# Patient Record
Sex: Male | Born: 1955 | Race: White | Hispanic: No | Marital: Married | Smoking: Former smoker
Health system: Southern US, Community
[De-identification: ages and names within clinical notes are randomized; demographics above are authoritative.]

## PROBLEM LIST (undated history)

## (undated) DIAGNOSIS — F419 Anxiety disorder, unspecified: Secondary | ICD-10-CM

## (undated) DIAGNOSIS — F32A Depression, unspecified: Secondary | ICD-10-CM

## (undated) DIAGNOSIS — I2510 Atherosclerotic heart disease of native coronary artery without angina pectoris: Secondary | ICD-10-CM

## (undated) DIAGNOSIS — F329 Major depressive disorder, single episode, unspecified: Secondary | ICD-10-CM

## (undated) DIAGNOSIS — R011 Cardiac murmur, unspecified: Secondary | ICD-10-CM

## (undated) DIAGNOSIS — I10 Essential (primary) hypertension: Secondary | ICD-10-CM

## (undated) HISTORY — PX: CARDIAC CATHETERIZATION: SHX172

## (undated) HISTORY — PX: CORONARY ARTERY BYPASS GRAFT: SHX141

---

## 1999-01-28 ENCOUNTER — Emergency Department (HOSPITAL_COMMUNITY): Admission: EM | Admit: 1999-01-28 | Discharge: 1999-01-28 | Payer: Self-pay | Admitting: Emergency Medicine

## 1999-01-28 ENCOUNTER — Encounter: Payer: Self-pay | Admitting: Emergency Medicine

## 2010-04-09 ENCOUNTER — Ambulatory Visit: Payer: Self-pay | Admitting: General Surgery

## 2010-09-17 ENCOUNTER — Ambulatory Visit: Payer: Self-pay | Admitting: Internal Medicine

## 2013-04-16 ENCOUNTER — Inpatient Hospital Stay: Payer: Self-pay | Admitting: Internal Medicine

## 2013-04-16 LAB — HEPATIC FUNCTION PANEL A (ARMC)
Bilirubin,Total: 0.5 mg/dL (ref 0.2–1.0)
SGOT(AST): 161 U/L — ABNORMAL HIGH (ref 15–37)
SGPT (ALT): 173 U/L — ABNORMAL HIGH (ref 12–78)
Total Protein: 7.2 g/dL (ref 6.4–8.2)

## 2013-04-16 LAB — BASIC METABOLIC PANEL
Creatinine: 0.74 mg/dL (ref 0.60–1.30)
EGFR (African American): >60
EGFR (Non-African Amer.): >60
Glucose: 98 mg/dL (ref 65–99)
Potassium: 3.8 mmol/L (ref 3.5–5.1)
Sodium: 137 mmol/L (ref 136–145)

## 2013-04-16 LAB — CK TOTAL AND CKMB (NOT AT ARMC)
CK, Total: 208 U/L (ref 35–232)
CK, Total: 194 U/L (ref 35–232)
CK, Total: 188 U/L (ref 35–232)
CK-MB: 1.3 ng/mL (ref 0.5–3.6)
CK-MB: 1.5 ng/mL (ref 0.5–3.6)

## 2013-04-16 LAB — TROPONIN I: Troponin-I: 0.25 ng/mL — ABNORMAL HIGH

## 2013-04-16 LAB — CBC
HCT: 42.9 % (ref 40.0–52.0)
HGB: 15.2 g/dL (ref 13.0–18.0)
MCH: 35.9 pg — ABNORMAL HIGH (ref 26.0–34.0)
MCHC: 35.5 g/dL (ref 32.0–36.0)
Platelet: 125 10*3/uL — ABNORMAL LOW (ref 150–440)

## 2013-04-16 LAB — PROTIME-INR: INR: 1

## 2013-04-16 LAB — LIPASE, BLOOD: Lipase: 112 U/L (ref 73–393)

## 2013-04-17 LAB — APTT: Activated PTT: 79.5 secs — ABNORMAL HIGH (ref 23.6–35.9)

## 2013-04-17 LAB — HEMOGLOBIN A1C: Hemoglobin A1C: 5.6 % (ref 4.2–6.3)

## 2013-04-17 LAB — LIPID PANEL
HDL Cholesterol: 47 mg/dL (ref 40–60)
Triglycerides: 138 mg/dL (ref 0–200)

## 2013-04-18 ENCOUNTER — Encounter (HOSPITAL_COMMUNITY)
Admission: EM | Disposition: A | Discharge status: Home / Self Care | Payer: Self-pay | Source: Other Acute Inpatient Hospital | Attending: Surgery

## 2013-04-18 ENCOUNTER — Encounter (HOSPITAL_COMMUNITY): Payer: MEDICAID | Admitting: Certified Registered"

## 2013-04-18 ENCOUNTER — Inpatient Hospital Stay (HOSPITAL_COMMUNITY)
Admission: EM | Admit: 2013-04-18 | Discharge: 2013-04-24 | DRG: 236 | Disposition: A | Discharge status: Home / Self Care | Payer: Self-pay | Source: Other Acute Inpatient Hospital | Attending: Surgery | Admitting: Surgery

## 2013-04-18 ENCOUNTER — Inpatient Hospital Stay (HOSPITAL_COMMUNITY): Payer: Self-pay

## 2013-04-18 ENCOUNTER — Inpatient Hospital Stay (HOSPITAL_COMMUNITY): Payer: Self-pay | Admitting: Certified Registered"

## 2013-04-18 DIAGNOSIS — I2510 Atherosclerotic heart disease of native coronary artery without angina pectoris: Secondary | ICD-10-CM | POA: Diagnosis present

## 2013-04-18 DIAGNOSIS — I10 Essential (primary) hypertension: Secondary | ICD-10-CM | POA: Diagnosis present

## 2013-04-18 DIAGNOSIS — Z833 Family history of diabetes mellitus: Secondary | ICD-10-CM

## 2013-04-18 DIAGNOSIS — D696 Thrombocytopenia, unspecified: Secondary | ICD-10-CM | POA: Diagnosis not present

## 2013-04-18 DIAGNOSIS — E8779 Other fluid overload: Secondary | ICD-10-CM | POA: Diagnosis not present

## 2013-04-18 DIAGNOSIS — F17200 Nicotine dependence, unspecified, uncomplicated: Secondary | ICD-10-CM | POA: Diagnosis present

## 2013-04-18 DIAGNOSIS — I214 Non-ST elevation (NSTEMI) myocardial infarction: Principal | ICD-10-CM | POA: Diagnosis present

## 2013-04-18 DIAGNOSIS — D62 Acute posthemorrhagic anemia: Secondary | ICD-10-CM | POA: Diagnosis not present

## 2013-04-18 DIAGNOSIS — K5900 Constipation, unspecified: Secondary | ICD-10-CM | POA: Diagnosis not present

## 2013-04-18 DIAGNOSIS — R011 Cardiac murmur, unspecified: Secondary | ICD-10-CM | POA: Diagnosis present

## 2013-04-18 DIAGNOSIS — D689 Coagulation defect, unspecified: Secondary | ICD-10-CM | POA: Diagnosis not present

## 2013-04-18 DIAGNOSIS — Z823 Family history of stroke: Secondary | ICD-10-CM

## 2013-04-18 HISTORY — DX: Cardiac murmur, unspecified: R01.1

## 2013-04-18 HISTORY — PX: ENDOVEIN HARVEST OF GREATER SAPHENOUS VEIN: SHX5059

## 2013-04-18 HISTORY — PX: CORONARY ARTERY BYPASS GRAFT: SHX141

## 2013-04-18 HISTORY — PX: INTRAOPERATIVE TRANSESOPHAGEAL ECHOCARDIOGRAM: SHX5062

## 2013-04-18 HISTORY — DX: Anxiety disorder, unspecified: F41.9

## 2013-04-18 HISTORY — DX: Major depressive disorder, single episode, unspecified: F32.9

## 2013-04-18 HISTORY — DX: Depression, unspecified: F32.A

## 2013-04-18 HISTORY — DX: Essential (primary) hypertension: I10

## 2013-04-18 HISTORY — DX: Atherosclerotic heart disease of native coronary artery without angina pectoris: I25.10

## 2013-04-18 LAB — CBC
Hemoglobin: 8.6 g/dL — ABNORMAL LOW (ref 13.0–17.0)
MCHC: 35.5 g/dL (ref 30.0–36.0)
Platelets: 75 10*3/uL — ABNORMAL LOW (ref 150–400)
RDW: 12.6 % (ref 11.5–15.5)
WBC: 4.6 10*3/uL (ref 4.0–10.5)

## 2013-04-18 LAB — POCT I-STAT 3, ART BLOOD GAS (G3+)
Acid-base deficit: 4 mmol/L — ABNORMAL HIGH (ref 0.0–2.0)
Bicarbonate: 22.1 mEq/L (ref 20.0–24.0)
Bicarbonate: 21.8 meq/L (ref 20.0–24.0)
O2 Saturation: 100 %
O2 Saturation: 96 %
Patient temperature: 34.8
TCO2: 24 mmol/L (ref 0–100)
TCO2: 23 mmol/L (ref 0–100)
TCO2: 23 mmol/L (ref 0–100)
pCO2 arterial: 38.6 mmHg (ref 35.0–45.0)
pCO2 arterial: 38.5 mmHg (ref 35.0–45.0)
pH, Arterial: 7.332 — ABNORMAL LOW (ref 7.350–7.450)
pH, Arterial: 7.366 (ref 7.350–7.450)
pH, Arterial: 7.351 (ref 7.350–7.450)
pO2, Arterial: 78 mmHg — ABNORMAL LOW (ref 80.0–100.0)

## 2013-04-18 LAB — POCT I-STAT 4, (NA,K, GLUC, HGB,HCT)
Glucose, Bld: 106 mg/dL — ABNORMAL HIGH (ref 70–99)
Glucose, Bld: 93 mg/dL (ref 70–99)
Glucose, Bld: 106 mg/dL — ABNORMAL HIGH (ref 70–99)
Glucose, Bld: 143 mg/dL — ABNORMAL HIGH (ref 70–99)
Glucose, Bld: 105 mg/dL — ABNORMAL HIGH (ref 70–99)
HCT: 42 % (ref 39.0–52.0)
HCT: 28 % — ABNORMAL LOW (ref 39.0–52.0)
HCT: 38 % — ABNORMAL LOW (ref 39.0–52.0)
HCT: 28 % — ABNORMAL LOW (ref 39.0–52.0)
HCT: 27 % — ABNORMAL LOW (ref 39.0–52.0)
HCT: 26 % — ABNORMAL LOW (ref 39.0–52.0)
Hemoglobin: 14.3 g/dL (ref 13.0–17.0)
Hemoglobin: 9.5 g/dL — ABNORMAL LOW (ref 13.0–17.0)
Hemoglobin: 8.8 g/dL — ABNORMAL LOW (ref 13.0–17.0)
Potassium: 4.3 mEq/L (ref 3.5–5.1)
Potassium: 4.6 mEq/L (ref 3.5–5.1)
Potassium: 3.9 meq/L (ref 3.5–5.1)
Sodium: 137 mEq/L (ref 135–145)
Sodium: 134 mEq/L — ABNORMAL LOW (ref 135–145)
Sodium: 141 meq/L (ref 135–145)

## 2013-04-18 LAB — PROTIME-INR
INR: 1.48 (ref 0.00–1.49)
Prothrombin Time: 17.5 s — ABNORMAL HIGH (ref 11.6–15.2)

## 2013-04-18 LAB — ABO/RH: ABO/RH(D): O NEG

## 2013-04-18 LAB — HEMOGLOBIN: HGB: 14.6 g/dL (ref 13.0–18.0)

## 2013-04-18 LAB — APTT
Activated PTT: 92.9 secs — ABNORMAL HIGH (ref 23.6–35.9)
aPTT: 39 s — ABNORMAL HIGH (ref 24–37)

## 2013-04-18 LAB — HEMOGLOBIN AND HEMATOCRIT, BLOOD: Hemoglobin: 9.1 g/dL — ABNORMAL LOW (ref 13.0–17.0)

## 2013-04-18 LAB — PLATELET COUNT: Platelet: 117 10*3/uL — ABNORMAL LOW (ref 150–440)

## 2013-04-18 SURGERY — CORONARY ARTERY BYPASS GRAFTING (CABG)
Anesthesia: General | Site: Leg Upper | Laterality: Right

## 2013-04-18 MED ORDER — POTASSIUM CHLORIDE 2 MEQ/ML IV SOLN
80.0000 meq | INTRAVENOUS | Status: DC
  Filled 2013-04-18: qty 40

## 2013-04-18 MED ORDER — SODIUM CHLORIDE 0.9 % IV SOLN: 250.0000 mL | INTRAVENOUS | Status: DC

## 2013-04-18 MED ORDER — METOPROLOL TARTRATE 12.5 MG HALF TABLET
12.5000 mg | ORAL_TABLET | Freq: Two times a day (BID) | ORAL | Status: DC
  Filled 2013-04-18 (×5): qty 1

## 2013-04-18 MED ORDER — LACTATED RINGERS IV SOLN
INTRAVENOUS | Status: DC | PRN
  Administered 2013-04-18: 14:00:00 via INTRAVENOUS

## 2013-04-18 MED ORDER — MORPHINE SULFATE 2 MG/ML IJ SOLN
1.0000 mg | INTRAMUSCULAR | Status: AC | PRN
  Administered 2013-04-18: 4 mg via INTRAVENOUS
  Administered 2013-04-19: 2 mg via INTRAVENOUS

## 2013-04-18 MED ORDER — BISACODYL 10 MG RE SUPP: 10.0000 mg | Freq: Every day | RECTAL | Status: DC

## 2013-04-18 MED ORDER — LACTATED RINGERS IV SOLN: 500.0000 mL | Freq: Once | INTRAVENOUS | Status: AC | PRN

## 2013-04-18 MED ORDER — LACTATED RINGERS IV SOLN: INTRAVENOUS | Status: DC

## 2013-04-18 MED ORDER — SODIUM CHLORIDE 0.45 % IV SOLN: INTRAVENOUS | Status: DC

## 2013-04-18 MED ORDER — THROMBIN 20000 UNITS EX SOLR
OROMUCOSAL | Status: DC | PRN
  Administered 2013-04-18: 14:00:00 via TOPICAL

## 2013-04-18 MED ORDER — SODIUM CHLORIDE 0.9 % IV SOLN
INTRAVENOUS | Status: AC
  Administered 2013-04-18: 1.4 [IU]/h via INTRAVENOUS
  Filled 2013-04-18: qty 1

## 2013-04-18 MED ORDER — NITROGLYCERIN IN D5W 200-5 MCG/ML-% IV SOLN
0.0000 ug/min | INTRAVENOUS | Status: DC
Start: 2013-04-18 — End: 2013-04-21
  Filled 2013-04-18: qty 250

## 2013-04-18 MED ORDER — METOPROLOL TARTRATE 1 MG/ML IV SOLN: 2.5000 mg | INTRAVENOUS | Status: DC | PRN

## 2013-04-18 MED ORDER — ASPIRIN 81 MG PO CHEW
324.0000 mg | CHEWABLE_TABLET | Freq: Every day | ORAL | Status: DC
Start: 2013-04-19 — End: 2013-04-21

## 2013-04-18 MED ORDER — DEXTROSE 5 % IV SOLN
1.5000 g | Freq: Two times a day (BID) | INTRAVENOUS | Status: AC
  Administered 2013-04-18 – 2013-04-20 (×4): 1.5 g via INTRAVENOUS
  Filled 2013-04-18 (×4): qty 1.5

## 2013-04-18 MED ORDER — DOCUSATE SODIUM 100 MG PO CAPS
200.0000 mg | ORAL_CAPSULE | Freq: Every day | ORAL | Status: DC
  Administered 2013-04-19 – 2013-04-21 (×3): 200 mg via ORAL
  Filled 2013-04-18 (×3): qty 2

## 2013-04-18 MED ORDER — SODIUM CHLORIDE 0.9 % IV SOLN
INTRAVENOUS | Status: DC
  Filled 2013-04-18: qty 30

## 2013-04-18 MED ORDER — ACETAMINOPHEN 500 MG PO TABS
1000.0000 mg | ORAL_TABLET | Freq: Four times a day (QID) | ORAL | Status: DC
  Administered 2013-04-19 – 2013-04-21 (×7): 1000 mg via ORAL
  Filled 2013-04-18 (×14): qty 2

## 2013-04-18 MED ORDER — SODIUM CHLORIDE 0.9 % IV SOLN
INTRAVENOUS | Status: AC
  Administered 2013-04-18: 70 mL/h via INTRAVENOUS
  Filled 2013-04-18: qty 40

## 2013-04-18 MED ORDER — ONDANSETRON HCL 4 MG/2ML IJ SOLN: 4.0000 mg | Freq: Four times a day (QID) | INTRAMUSCULAR | Status: DC | PRN

## 2013-04-18 MED ORDER — OXYCODONE HCL 5 MG PO TABS
5.0000 mg | ORAL_TABLET | ORAL | Status: DC | PRN
  Administered 2013-04-19 (×2): 5 mg via ORAL
  Administered 2013-04-20 (×2): 10 mg via ORAL
  Administered 2013-04-20: 5 mg via ORAL
  Administered 2013-04-20: 10 mg via ORAL
  Administered 2013-04-20: 5 mg via ORAL
  Administered 2013-04-21 (×4): 10 mg via ORAL
  Filled 2013-04-18: qty 1
  Filled 2013-04-18 (×2): qty 2
  Filled 2013-04-18: qty 1
  Filled 2013-04-18: qty 2
  Filled 2013-04-18: qty 1
  Filled 2013-04-18 (×3): qty 2
  Filled 2013-04-18: qty 1
  Filled 2013-04-18: qty 2

## 2013-04-18 MED ORDER — PHENYLEPHRINE HCL 10 MG/ML IJ SOLN
0.0000 ug/min | INTRAVENOUS | Status: DC
  Administered 2013-04-19: 20 ug/min via INTRAVENOUS
  Filled 2013-04-18 (×3): qty 2

## 2013-04-18 MED ORDER — ALBUMIN HUMAN 5 % IV SOLN
250.0000 mL | INTRAVENOUS | Status: AC | PRN
  Administered 2013-04-18 – 2013-04-19 (×4): 250 mL via INTRAVENOUS
  Filled 2013-04-18 (×2): qty 250

## 2013-04-18 MED ORDER — PLASMA-LYTE 148 IV SOLN
INTRAVENOUS | Status: AC
  Administered 2013-04-18: 14:00:00
  Filled 2013-04-18: qty 2.5

## 2013-04-18 MED ORDER — 0.9 % SODIUM CHLORIDE (POUR BTL) OPTIME
TOPICAL | Status: DC | PRN
  Administered 2013-04-18: 1000 mL

## 2013-04-18 MED ORDER — METOPROLOL TARTRATE 25 MG/10 ML ORAL SUSPENSION
12.5000 mg | Freq: Two times a day (BID) | ORAL | Status: DC
  Filled 2013-04-18 (×5): qty 5

## 2013-04-18 MED ORDER — DOPAMINE-DEXTROSE 3.2-5 MG/ML-% IV SOLN: 2.0000 ug/kg/min | INTRAVENOUS | Status: DC

## 2013-04-18 MED ORDER — DEXMEDETOMIDINE HCL IN NACL 200 MCG/50ML IV SOLN
0.1000 ug/kg/h | INTRAVENOUS | Status: DC
  Administered 2013-04-19: 0.2 ug/kg/h via INTRAVENOUS
  Filled 2013-04-18 (×3): qty 50

## 2013-04-18 MED ORDER — MORPHINE SULFATE 2 MG/ML IJ SOLN
2.0000 mg | INTRAMUSCULAR | Status: DC | PRN
  Administered 2013-04-19 (×2): 2 mg via INTRAVENOUS
  Administered 2013-04-20: 4 mg via INTRAVENOUS
  Administered 2013-04-20: 2 mg via INTRAVENOUS
  Filled 2013-04-18 (×3): qty 1
  Filled 2013-04-18: qty 2
  Filled 2013-04-18: qty 1
  Filled 2013-04-18: qty 2

## 2013-04-18 MED ORDER — FAMOTIDINE IN NACL 20-0.9 MG/50ML-% IV SOLN
20.0000 mg | Freq: Two times a day (BID) | INTRAVENOUS | Status: AC
  Administered 2013-04-18 – 2013-04-19 (×2): 20 mg via INTRAVENOUS
  Filled 2013-04-18 (×2): qty 50

## 2013-04-18 MED ORDER — NITROGLYCERIN IN D5W 200-5 MCG/ML-% IV SOLN
2.0000 ug/min | INTRAVENOUS | Status: AC
  Administered 2013-04-18: 5 ug/min via INTRAVENOUS

## 2013-04-18 MED ORDER — FENTANYL CITRATE 0.05 MG/ML IJ SOLN
INTRAMUSCULAR | Status: DC | PRN
  Administered 2013-04-18: 100 ug via INTRAVENOUS
  Administered 2013-04-18 (×5): 250 ug via INTRAVENOUS

## 2013-04-18 MED ORDER — EPINEPHRINE HCL 1 MG/ML IJ SOLN
0.5000 ug/min | INTRAVENOUS | Status: DC
  Filled 2013-04-18: qty 4

## 2013-04-18 MED ORDER — PROPOFOL 10 MG/ML IV BOLUS
INTRAVENOUS | Status: DC | PRN
  Administered 2013-04-18: 140 mg via INTRAVENOUS
  Administered 2013-04-18: 60 mg via INTRAVENOUS

## 2013-04-18 MED ORDER — PANTOPRAZOLE SODIUM 40 MG PO TBEC
40.0000 mg | DELAYED_RELEASE_TABLET | Freq: Every day | ORAL | Status: DC
  Administered 2013-04-20 – 2013-04-21 (×2): 40 mg via ORAL
  Filled 2013-04-18 (×2): qty 1

## 2013-04-18 MED ORDER — VECURONIUM BROMIDE 10 MG IV SOLR
INTRAVENOUS | Status: DC | PRN
  Administered 2013-04-18: 5 mg via INTRAVENOUS
  Administered 2013-04-18: 2 mg via INTRAVENOUS
  Administered 2013-04-18: 5 mg via INTRAVENOUS
  Administered 2013-04-18: 2 mg via INTRAVENOUS

## 2013-04-18 MED ORDER — MIDAZOLAM HCL 5 MG/5ML IJ SOLN
INTRAMUSCULAR | Status: DC | PRN
  Administered 2013-04-18: 3 mg via INTRAVENOUS
  Administered 2013-04-18: 2 mg via INTRAVENOUS
  Administered 2013-04-18: 3 mg via INTRAVENOUS
  Administered 2013-04-18 (×2): 2 mg via INTRAVENOUS

## 2013-04-18 MED ORDER — METOCLOPRAMIDE HCL 5 MG/ML IJ SOLN
10.0000 mg | Freq: Four times a day (QID) | INTRAMUSCULAR | Status: AC
  Administered 2013-04-18 – 2013-04-19 (×4): 10 mg via INTRAVENOUS
  Filled 2013-04-18 (×4): qty 2

## 2013-04-18 MED ORDER — MAGNESIUM SULFATE 40 MG/ML IJ SOLN
4.0000 g | Freq: Once | INTRAMUSCULAR | Status: AC
  Administered 2013-04-18: 4 g via INTRAVENOUS
  Filled 2013-04-18: qty 100

## 2013-04-18 MED ORDER — ACETAMINOPHEN 160 MG/5ML PO SOLN
1000.0000 mg | Freq: Four times a day (QID) | ORAL | Status: DC
  Administered 2013-04-19 (×2): 1000 mg
  Filled 2013-04-18 (×2): qty 40.6

## 2013-04-18 MED ORDER — MAGNESIUM SULFATE 50 % IJ SOLN
40.0000 meq | INTRAMUSCULAR | Status: DC
  Filled 2013-04-18: qty 10

## 2013-04-18 MED ORDER — DEXMEDETOMIDINE HCL IN NACL 400 MCG/100ML IV SOLN
0.1000 ug/kg/h | INTRAVENOUS | Status: DC
  Administered 2013-04-18: 0.2 ug/kg/h via INTRAVENOUS

## 2013-04-18 MED ORDER — ALBUMIN HUMAN 5 % IV SOLN
INTRAVENOUS | Status: DC | PRN
  Administered 2013-04-18 (×2): via INTRAVENOUS

## 2013-04-18 MED ORDER — SODIUM CHLORIDE 0.9 % IV SOLN
INTRAVENOUS | Status: DC
  Administered 2013-04-20: 13:00:00 via INTRAVENOUS

## 2013-04-18 MED ORDER — SUCCINYLCHOLINE CHLORIDE 20 MG/ML IJ SOLN
INTRAMUSCULAR | Status: DC | PRN
  Administered 2013-04-18: 100 mg via INTRAVENOUS

## 2013-04-18 MED ORDER — POTASSIUM CHLORIDE 10 MEQ/50ML IV SOLN
10.0000 meq | INTRAVENOUS | Status: AC
  Administered 2013-04-18 (×3): 10 meq via INTRAVENOUS

## 2013-04-18 MED ORDER — INSULIN REGULAR HUMAN 100 UNIT/ML IJ SOLN
INTRAMUSCULAR | Status: DC
  Administered 2013-04-19: 04:00:00 via INTRAVENOUS
  Filled 2013-04-18: qty 1

## 2013-04-18 MED ORDER — VANCOMYCIN HCL IN DEXTROSE 1-5 GM/200ML-% IV SOLN
1000.0000 mg | Freq: Once | INTRAVENOUS | Status: AC
  Administered 2013-04-19: 1000 mg via INTRAVENOUS
  Filled 2013-04-18: qty 200

## 2013-04-18 MED ORDER — PHENYLEPHRINE HCL 10 MG/ML IJ SOLN
30.0000 ug/min | INTRAVENOUS | Status: DC
  Filled 2013-04-18: qty 2

## 2013-04-18 MED ORDER — ACETAMINOPHEN 650 MG RE SUPP
650.0000 mg | Freq: Once | RECTAL | Status: AC
  Administered 2013-04-18: 650 mg via RECTAL

## 2013-04-18 MED ORDER — INSULIN REGULAR BOLUS VIA INFUSION
0.0000 [IU] | Freq: Three times a day (TID) | INTRAVENOUS | Status: DC
  Filled 2013-04-18: qty 10

## 2013-04-18 MED ORDER — DEXTROSE 5 % IV SOLN
750.0000 mg | INTRAVENOUS | Status: DC
  Filled 2013-04-18: qty 750

## 2013-04-18 MED ORDER — ASPIRIN EC 325 MG PO TBEC
325.0000 mg | DELAYED_RELEASE_TABLET | Freq: Every day | ORAL | Status: DC
  Administered 2013-04-19 – 2013-04-21 (×3): 325 mg via ORAL
  Filled 2013-04-18 (×3): qty 1

## 2013-04-18 MED ORDER — CEFUROXIME SODIUM 1.5 G IJ SOLR
1.5000 g | INTRAMUSCULAR | Status: AC
  Administered 2013-04-18: 1.5 g via INTRAVENOUS
  Administered 2013-04-18: .75 g via INTRAVENOUS
  Filled 2013-04-18: qty 1.5

## 2013-04-18 MED ORDER — ACETAMINOPHEN 160 MG/5ML PO SOLN: 650.0000 mg | Freq: Once | ORAL | Status: AC

## 2013-04-18 MED ORDER — HEPARIN SODIUM (PORCINE) 1000 UNIT/ML IJ SOLN
INTRAMUSCULAR | Status: DC | PRN
  Administered 2013-04-18: 24000 [IU] via INTRAVENOUS

## 2013-04-18 MED ORDER — SODIUM CHLORIDE 0.9 % IJ SOLN
3.0000 mL | Freq: Two times a day (BID) | INTRAMUSCULAR | Status: DC
  Administered 2013-04-19 – 2013-04-21 (×4): 3 mL via INTRAVENOUS

## 2013-04-18 MED ORDER — ROCURONIUM BROMIDE 100 MG/10ML IV SOLN
INTRAVENOUS | Status: DC | PRN
  Administered 2013-04-18 (×2): 50 mg via INTRAVENOUS

## 2013-04-18 MED ORDER — VANCOMYCIN HCL 10 G IV SOLR
1250.0000 mg | INTRAVENOUS | Status: AC
  Administered 2013-04-18: 1250 mg via INTRAVENOUS
  Filled 2013-04-18: qty 1250

## 2013-04-18 MED ORDER — LIDOCAINE HCL (CARDIAC) 20 MG/ML IV SOLN
INTRAVENOUS | Status: DC | PRN
  Administered 2013-04-18: 50 mg via INTRAVENOUS

## 2013-04-18 MED ORDER — SODIUM CHLORIDE 0.9 % IJ SOLN: 3.0000 mL | INTRAMUSCULAR | Status: DC | PRN

## 2013-04-18 MED ORDER — HEMOSTATIC AGENTS (NO CHARGE) OPTIME
TOPICAL | Status: DC | PRN
  Administered 2013-04-18: 1 via TOPICAL

## 2013-04-18 MED ORDER — PROTAMINE SULFATE 10 MG/ML IV SOLN
INTRAVENOUS | Status: DC | PRN
  Administered 2013-04-18: 10 mg via INTRAVENOUS
  Administered 2013-04-18: 190 mg via INTRAVENOUS

## 2013-04-18 MED ORDER — MIDAZOLAM HCL 2 MG/2ML IJ SOLN
2.0000 mg | INTRAMUSCULAR | Status: DC | PRN
  Administered 2013-04-19: 2 mg via INTRAVENOUS
  Filled 2013-04-18: qty 2

## 2013-04-18 MED ORDER — BISACODYL 5 MG PO TBEC
10.0000 mg | DELAYED_RELEASE_TABLET | Freq: Every day | ORAL | Status: DC
  Administered 2013-04-19 – 2013-04-21 (×3): 10 mg via ORAL
  Filled 2013-04-18 (×3): qty 2

## 2013-04-18 SURGICAL SUPPLY — 103 items
ADH SKN CLS APL DERMABOND .7 (GAUZE/BANDAGES/DRESSINGS) ×3
ATTRACTOMAT 16X20 MAGNETIC DRP (DRAPES) ×4 IMPLANT
BAG DECANTER FOR FLEXI CONT (MISCELLANEOUS) ×4 IMPLANT
BANDAGE ELASTIC 4 VELCRO ST LF (GAUZE/BANDAGES/DRESSINGS) ×4 IMPLANT
BANDAGE ELASTIC 6 VELCRO ST LF (GAUZE/BANDAGES/DRESSINGS) ×4 IMPLANT
BANDAGE GAUZE ELAST BULKY 4 IN (GAUZE/BANDAGES/DRESSINGS) ×4 IMPLANT
BASKET HEART (ORDER IN 25'S) (MISCELLANEOUS) ×1
BASKET HEART (ORDER IN 25S) (MISCELLANEOUS) ×3 IMPLANT
BLADE STERNUM SYSTEM 6 (BLADE) ×4 IMPLANT
BLADE SURG 11 STRL SS (BLADE) ×1 IMPLANT
CANISTER SUCTION 2500CC (MISCELLANEOUS) ×4 IMPLANT
CANNULA VENOUS LOW PROF 34X46 (CANNULA) ×4 IMPLANT
CANNULA VESSEL 3MM BLUNT TIP (CANNULA) ×3 IMPLANT
CARDIAC SUCTION (MISCELLANEOUS) ×1 IMPLANT
CATH ROBINSON RED A/P 18FR (CATHETERS) ×8 IMPLANT
CATH THORACIC 28FR (CATHETERS) ×4 IMPLANT
CATH THORACIC 28FR RT ANG (CATHETERS) IMPLANT
CATH THORACIC 36FR (CATHETERS) ×4 IMPLANT
CATH THORACIC 36FR RT ANG (CATHETERS) ×4 IMPLANT
CLIP RETRACTION 3.0MM CORONARY (MISCELLANEOUS) ×1 IMPLANT
CLIP TI MEDIUM 24 (CLIP) IMPLANT
CLIP TI WIDE RED SMALL 24 (CLIP) ×1 IMPLANT
COVER SURGICAL LIGHT HANDLE (MISCELLANEOUS) ×4 IMPLANT
CRADLE DONUT ADULT HEAD (MISCELLANEOUS) ×4 IMPLANT
DERMABOND ADVANCED (GAUZE/BANDAGES/DRESSINGS) ×1
DERMABOND ADVANCED .7 DNX12 (GAUZE/BANDAGES/DRESSINGS) IMPLANT
DRAPE CARDIOVASCULAR INCISE (DRAPES) ×4
DRAPE SLUSH/WARMER DISC (DRAPES) ×1 IMPLANT
DRAPE SRG 135X102X78XABS (DRAPES) ×3 IMPLANT
DRSG COVADERM 4X14 (GAUZE/BANDAGES/DRESSINGS) ×4 IMPLANT
ELECT CAUTERY BLADE 6.4 (BLADE) ×4 IMPLANT
ELECT REM PT RETURN 9FT ADLT (ELECTROSURGICAL) ×8
ELECTRODE REM PT RTRN 9FT ADLT (ELECTROSURGICAL) ×6 IMPLANT
GLOVE BIO SURGEON STRL SZ 6 (GLOVE) IMPLANT
GLOVE BIO SURGEON STRL SZ 6.5 (GLOVE) IMPLANT
GLOVE BIO SURGEON STRL SZ7 (GLOVE) IMPLANT
GLOVE BIO SURGEON STRL SZ7.5 (GLOVE) IMPLANT
GLOVE BIOGEL PI IND STRL 6 (GLOVE) IMPLANT
GLOVE BIOGEL PI IND STRL 6.5 (GLOVE) IMPLANT
GLOVE BIOGEL PI IND STRL 7.0 (GLOVE) IMPLANT
GLOVE BIOGEL PI INDICATOR 6 (GLOVE) ×1
GLOVE BIOGEL PI INDICATOR 6.5 (GLOVE) ×4
GLOVE BIOGEL PI INDICATOR 7.0 (GLOVE) ×4
GLOVE EUDERMIC 7 POWDERFREE (GLOVE) ×8 IMPLANT
GLOVE ORTHO TXT STRL SZ7.5 (GLOVE) IMPLANT
GOWN PREVENTION PLUS XLARGE (GOWN DISPOSABLE) ×6 IMPLANT
GOWN STRL NON-REIN LRG LVL3 (GOWN DISPOSABLE) ×16 IMPLANT
HEMOSTAT POWDER SURGIFOAM 1G (HEMOSTASIS) ×12 IMPLANT
HEMOSTAT SURGICEL 2X14 (HEMOSTASIS) ×4 IMPLANT
INSERT FOGARTY 61MM (MISCELLANEOUS) IMPLANT
INSERT FOGARTY XLG (MISCELLANEOUS) IMPLANT
KIT BASIN OR (CUSTOM PROCEDURE TRAY) ×4 IMPLANT
KIT CATH CPB BARTLE (MISCELLANEOUS) ×4 IMPLANT
KIT ROOM TURNOVER OR (KITS) ×4 IMPLANT
KIT SUCTION CATH 14FR (SUCTIONS) ×4 IMPLANT
KIT VASOVIEW W/TROCAR VH 2000 (KITS) ×4 IMPLANT
NS IRRIG 1000ML POUR BTL (IV SOLUTION) ×21 IMPLANT
PACK OPEN HEART (CUSTOM PROCEDURE TRAY) ×4 IMPLANT
PAD ARMBOARD 7.5X6 YLW CONV (MISCELLANEOUS) ×8 IMPLANT
PAD ELECT DEFIB RADIOL ZOLL (MISCELLANEOUS) ×4 IMPLANT
PENCIL BUTTON HOLSTER BLD 10FT (ELECTRODE) ×4 IMPLANT
PUNCH AORTIC ROTATE 4.0MM (MISCELLANEOUS) IMPLANT
PUNCH AORTIC ROTATE 4.5MM 8IN (MISCELLANEOUS) ×4 IMPLANT
PUNCH AORTIC ROTATE 5MM 8IN (MISCELLANEOUS) IMPLANT
SET CARDIOPLEGIA MPS 5001102 (MISCELLANEOUS) ×1 IMPLANT
SPONGE GAUZE 4X4 12PLY (GAUZE/BANDAGES/DRESSINGS) ×8 IMPLANT
SPONGE INTESTINAL PEANUT (DISPOSABLE) IMPLANT
SPONGE LAP 18X18 X RAY DECT (DISPOSABLE) ×3 IMPLANT
SPONGE LAP 4X18 X RAY DECT (DISPOSABLE) ×4 IMPLANT
SUT BONE WAX W31G (SUTURE) ×4 IMPLANT
SUT MNCRL AB 4-0 PS2 18 (SUTURE) ×1 IMPLANT
SUT PROLENE 3 0 SH DA (SUTURE) ×1 IMPLANT
SUT PROLENE 3 0 SH1 36 (SUTURE) ×4 IMPLANT
SUT PROLENE 4 0 RB 1 (SUTURE)
SUT PROLENE 4 0 SH DA (SUTURE) IMPLANT
SUT PROLENE 4-0 RB1 .5 CRCL 36 (SUTURE) IMPLANT
SUT PROLENE 5 0 C 1 36 (SUTURE) IMPLANT
SUT PROLENE 6 0 C 1 30 (SUTURE) ×1 IMPLANT
SUT PROLENE 7 0 BV 1 (SUTURE) ×1 IMPLANT
SUT PROLENE 7 0 BV1 MDA (SUTURE) ×4 IMPLANT
SUT PROLENE 8 0 BV175 6 (SUTURE) IMPLANT
SUT SILK  1 MH (SUTURE)
SUT SILK 1 MH (SUTURE) IMPLANT
SUT STEEL STERNAL CCS#1 18IN (SUTURE) IMPLANT
SUT STEEL SZ 6 DBL 3X14 BALL (SUTURE) ×3 IMPLANT
SUT VIC AB 1 CTX 36 (SUTURE) ×8
SUT VIC AB 1 CTX36XBRD ANBCTR (SUTURE) ×6 IMPLANT
SUT VIC AB 2-0 CT1 27 (SUTURE) ×4
SUT VIC AB 2-0 CT1 TAPERPNT 27 (SUTURE) IMPLANT
SUT VIC AB 2-0 CTX 27 (SUTURE) IMPLANT
SUT VIC AB 3-0 SH 27 (SUTURE)
SUT VIC AB 3-0 SH 27X BRD (SUTURE) IMPLANT
SUT VIC AB 3-0 X1 27 (SUTURE) IMPLANT
SUT VICRYL 4-0 PS2 18IN ABS (SUTURE) IMPLANT
SUTURE E-PAK OPEN HEART (SUTURE) ×4 IMPLANT
SYSTEM SAHARA CHEST DRAIN ATS (WOUND CARE) ×4 IMPLANT
TAPE CLOTH SURG 4X10 WHT LF (GAUZE/BANDAGES/DRESSINGS) ×1 IMPLANT
TOWEL OR 17X24 6PK STRL BLUE (TOWEL DISPOSABLE) ×4 IMPLANT
TOWEL OR 17X26 10 PK STRL BLUE (TOWEL DISPOSABLE) ×4 IMPLANT
TRAY FOLEY IC TEMP SENS 14FR (CATHETERS) ×4 IMPLANT
TUBING INSUFFLATION 10FT LAP (TUBING) ×4 IMPLANT
UNDERPAD 30X30 INCONTINENT (UNDERPADS AND DIAPERS) ×4 IMPLANT
WATER STERILE IRR 1000ML POUR (IV SOLUTION) ×8 IMPLANT

## 2013-04-18 NOTE — Anesthesia Procedure Notes (Signed)
Procedures The patient was identified and consent obtained.  TO was performed, and full barrier precautions were used.  The skin was anesthetized with lidocaine.  Once the vein was located with the 22 ga. needle using ultrasound guidance , the wire was inserted into the vein.  The wire location was confirmed with ultrasound.  The insertion site was dilated and the introducer was carefully inserted and sutured in place. The PAC was checked, and floated into the PA.  Once in the PA, the catheter was secured. The patient tolerated the procedure well.  CXR was ordered for PACU. Start: 1336 End: 1346 J. Claybon Jabs, MD

## 2013-04-18 NOTE — Op Note (Signed)
CARDIOVASCULAR SURGERY OPERATIVE NOTE  04/18/2013  Surgeon:  Alleen Borne, MD  First Assistant: Doree Fudge, [place]   Preoperative Diagnosis:  Severe multi-vessel coronary artery disease   Postoperative Diagnosis:  Same   Procedure:  1. Median Sternotomy 2. Extracorporeal circulation 3.   Emergent Coronary artery bypass grafting x 3   Left internal mammary graft to the LAD  SVG to OM1  SVG to PDA  4.   Endoscopic vein harvest from the right leg   Anesthesia:  General Endotracheal   Clinical History/Surgical Indication:   He is a 57 year old cigar smoker with a hx of HTN and a heart murmur who presented to [facility] on Saturday with chest pain. His initial troponin was negative but the second was 0.25. He was started on Plavix and cath this am shows a 90% distal LM stenosis and 70% proximal RCA with normal LV function. He had no gradient across the aortic valve. He was transferred here this afternoon for emergent CABG. I discussed the operative procedure with the patient including alternatives, benefits and risks; including but not limited to bleeding, blood transfusion, infection, stroke, myocardial infarction, graft failure, heart block requiring a permanent pacemaker, organ dysfunction, and death. Casimiro Needle Delamar understands and agrees to proceed.    Preparation:  The patient was seen in the preoperative holding area and the correct patient, correct operation were confirmed with the patient after reviewing the medical record and catheterization. The consent was signed by me. Preoperative antibiotics were given. A pulmonary arterial line and radial arterial line were placed by the anesthesia team. The patient was taken back to the operating room and positioned supine on the operating room table. After being placed under general endotracheal anesthesia by the anesthesia  team a foley catheter was placed. The neck, chest, abdomen, and both legs were prepped with betadine soap and solution and draped in the usual sterile manner. A surgical time-out was taken and the correct patient and operative procedure were confirmed with the nursing and anesthesia staff.   Cardiopulmonary Bypass:  A median sternotomy was performed. It was immediately evident that he was a Plavix responder. He bled from everything. The pericardium was opened in the midline. Right ventricular function appeared normal. The ascending aorta was of normal size and had no palpable plaque. There were no contraindications to aortic cannulation or cross-clamping. The patient was fully systemically heparinized and the ACT was maintained > 400 sec. The proximal aortic arch was cannulated with a 20 F aortic cannula for arterial inflow. Venous cannulation was performed via the right atrial appendage using a two-staged venous cannula. An antegrade cardioplegia/vent cannula was inserted into the mid-ascending aorta. Aortic occlusion was performed with a single cross-clamp. Systemic cooling to 32 degrees Centigrade and topical cooling of the heart with iced saline were used. Hyperkalemic antegrade cold blood cardioplegia was used to induce diastolic arrest and was then given at about 20 minute intervals throughout the period of arrest to maintain myocardial temperature at or below 10 degrees centigrade. A temperature probe was inserted into the interventricular septum and an insulating pad was placed in the pericardium.   Left internal mammary harvest:  The left side of the sternum was retracted using the Rultract retractor. The left internal mammary artery was harvested as a pedicle graft. All side branches were clipped. It was a medium-sized vessel of good quality with excellent blood flow. It was ligated distally and divided. It was sprayed with topical papaverine solution to prevent vasospasm.  Endoscopic vein  harvest:  The right greater saphenous vein was harvested endoscopically through a 2 cm incision medial to the right knee. It was harvested from the upper thigh to below the knee. It was a medium-sized vein of good quality. The side branches were all ligated with 4-0 silk ties.    Coronary arteries:  The coronary arteries were examined.   LAD:  Deep in the epicardial fat. No significant disease in the mid-portion  LCX:  The OM1 was a medium-sized vessel proximally but quickly divided into two tiny branches.  RCA:  Diffusely disease with calcific plaque. The PDA proximally was soft with minimal disease.  The patient had a large amount of epicardial fat covering the heart as well as draped over the lateral pericardium.  Grafts:  1. LIMA to the LAD: 1.75 mm. It was sewn end to side using 8-0 prolene continuous suture. 2. SVG to OM1:  1.6  mm. It was sewn end to side using 7-0 prolene continuous suture. 3. SVG to PDA :  1.75 mm. It was sewn end to side using 7-0 prolene continuous suture.  The proximal vein graft anastomoses were performed to the mid-ascending aorta using continuous 6-0 prolene suture. Graft markers were placed around the proximal anastomoses.   Completion:  The patient was rewarmed to 37 degrees Centigrade. The clamp was removed from the LIMA pedicle and there was rapid warming of the septum and return of ventricular fibrillation. The crossclamp was removed with a time of 63 minutes. There was spontaneous return of sinus rhythm. The distal and proximal anastomoses were checked for hemostasis. The position of the grafts was satisfactory. Two temporary epicardial pacing wires were placed on the right atrium and two on the right ventricle. The patient was weaned from CPB without difficulty on no inotropes. CPB time was 82 minutes. Cardiac output was 5 LPM. Heparin was fully reversed with protamine and the aortic and venous cannulas removed. The patient was very coagulopathic  and the platelet count was 80K on Plavix so he was given a unit of platelets.Adequate hemostasis was achieved. Mediastinal and left pleural drainage tubes were placed. The sternum was closed with double #6 stainless steel wires. The fascia was closed with continuous # 1 vicryl suture. The subcutaneous tissue was closed with 2-0 vicryl continuous suture. The skin was closed with 3-0 vicryl subcuticular suture. All sponge, needle, and instrument counts were reported correct at the end of the case. Dry sterile dressings were placed over the incisions and around the chest tubes which were connected to pleurevac suction. The patient was then transported to the surgical intensive care unit in critical but stable condition.

## 2013-04-18 NOTE — Progress Notes (Signed)
Cardiovascular Surgery Preop Note:  He is a 57 year old cigar smoker with a hx of HTN and a heart murmur who presented to [facility] on Saturday with chest pain. His initial troponin was negative but the second was 0.25. He was started on Plavix and cath this am shows a 90% distal LM stenosis and 70% proximal RCA with normal LV function. He had no gradient across the aortic valve. He was transferred here this afternoon for emergent CABG. I discussed the operative procedure with the patient including alternatives, benefits and risks; including but not limited to bleeding, blood transfusion, infection, stroke, myocardial infarction, graft failure, heart block requiring a permanent pacemaker, organ dysfunction, and death.  Vernon Patterson understands and agrees to proceed.

## 2013-04-18 NOTE — Brief Op Note (Addendum)
04/18/2013  4:47 PM  PATIENT:  Vernon Patterson  57 y.o. male  PRE-OPERATIVE DIAGNOSIS:  1. S/P NSTEMI 2. CAD (LEFT MAIN DISEASE)  POST-OPERATIVE DIAGNOSIS:   1. S/P NSTEMI 2. CAD (LEFT MAIN DISEASE)   PROCEDURE:INTRAOPERATIVE TRANSESOPHAGEAL ECHOCARDIOGRAM, EMERGENT CORONARY ARTERY BYPASS GRAFTING (CABG) x 3 (LIMA to LAD, SVG to OM1, SVG to PDA) using left internal mammary artery and endoscopically harvested right saphenous thigh vein  SURGEON:  Surgeon(s) and Role:    * Alleen Borne, MD - Primary  PHYSICIAN ASSISTANT: Doree Fudge PA-C   ANESTHESIA:   general  EBL:  Total I/O In: -  Out: 240 [Urine:240]  BLOOD ADMINISTERED:Two PLTS  DRAINS: Mediastinal and pleural spaces   COUNTS CORRECT:  YES  DICTATION: .Dragon Dictation  PLAN OF CARE: Admit to inpatient   PATIENT DISPOSITION:  ICU - intubated and hemodynamically stable.   Delay start of Pharmacological VTE agent (>24hrs) due to surgical blood loss or risk of bleeding: yes  PRE OP WEIGHT: 78 kg

## 2013-04-18 NOTE — Anesthesia Postprocedure Evaluation (Signed)
  Anesthesia Post-op Note  Patient: Vernon Patterson  Procedure(s) Performed: Procedure(s) with comments: CORONARY ARTERY BYPASS GRAFTING (CABG) (N/A) - Times  3  using left internal mammary artery and endoscopically harvested right saphenous vein INTRAOPERATIVE TRANSESOPHAGEAL ECHOCARDIOGRAM (N/A) ENDOVEIN HARVEST OF GREATER SAPHENOUS VEIN (Right) - Some of the saphenous vein from the lower leg also used.  Patient Location: PACU and SICU  Anesthesia Type:General  Level of Consciousness: sedated, unresponsive and Patient remains intubated per anesthesia plan  Airway and Oxygen Therapy: Patient remains intubated per anesthesia plan and Patient placed on Ventilator (see vital sign flow sheet for setting)  Post-op Pain: none  Post-op Assessment: Post-op Vital signs reviewed and Patient's Cardiovascular Status Stable  Post-op Vital Signs: Reviewed and stable  Complications: No apparent anesthesia complications

## 2013-04-18 NOTE — H&P (Signed)
[address]            [place] [address]          [phone]     Vernon Patterson is an 57 y.o. male. 1955-08-28 BJY:[id number]   PCP: Dr. Dewaine Oats Cardiologist: Dr. Milta Deiters  Chief Complaint: Chest pain  History of Presenting Illness:  This is a 57 year old Caucasian male, with cardiac risk factors which include hypertension and tobacco abuse,who presented to [facility] with chest pain. According to medical records, the patient described the chest pain as substernal, sharp, and denied pain radiation. Associated with it was shortness of breath.He has had the chest pain intermittently for 2 months but it worsened on 04/16/2013  so he presented for further evaluation and treatment. EKG showed normal sinus rhythm and nonspecific ST-T changes.Peak troponin was 0.29. He ruled in for a NSTEMI.He was given aspirin, loaded with Plavix, and put on a a heparin drip. He underwent a cardiac catheterization earlier today. He was found to a 90% left main stenosis, and a 70% proximal RCA with LVEF 55%. He was accepted in transfer to [facility] in order to undergo emergent CABG by Dr. Laneta Simmers. Currently, he is chest pain free.  Past Medical History: 1.Hypertension 2. Heart murmur  Past Surgical History: Denies  Family History:  Mother is deceased about 2 weeks ago from a stroke. She also had diabetes and hypertension. Father is deceased from complications of pneumonia.   Social History: Admits to smoking cigars daily (about 3 per day). Drinks about 4-5 cups of wine daily.  Allergies:  NKDA  No prescriptions prior to admission     Review of Systems:  Cardiac Review of Systems: Y or N  Chest Pain [ y ] Resting SOB [ n ] Exertional SOB [ y ] Pollyann Kennedy Milo.Brash ]  Pedal Edema y[ ]  Palpitations [n ] Syncope [ n] Presyncope [ n ]  General Review of Systems: [Y] = yes [ ] =no  Constitional: recent weight change [ n]; anorexia [n ]; fatigue  [ n]; nausea [n ]; night sweats [n ]; fever [n ]; or chills [n ];   Eye : blurred vision [n ]; diplopia [n ]; vision changes [n]; Amaurosis fugax[n ];  Resp: cough [n ]; wheezing[n ];  YQ:MVHQIONGEXB[M ]; vomiting[ n]; dysphagia[n ]; melena[ n]; hematochezia [ n]; heartburn[ n] WU:[id number][V ]; dysuria n[ ] ; nocturia[n ]; history of obstruction [ n];  Skin: rash, swelling[n ];, hair loss[n ]; peripheral edema[n ]; or itching[ n];  Musculosketetal: myalgias[n ]; joint swelling[n ]; joint erythema[n ];  joint pain[n ]; back pain[ n];  Heme/Lymph: bruising[n ]; bleeding[n ]; anemia[ n];  Neuro: TIA[n ]; headaches[n ]; stroke[n ]; vertigo[ n]; seizures[n ]; paresthesias[ n]; difficulty walking[ n];  Psych:depression[ n]; anxiety[ n]; Endocrine: diabetes[n ]; thyroid dysfunction[ nn];    There were no vitals taken for this visit.  Physical Exam: General appearance: alert, cooperative and no distress HEENT: Head-atraumatic and normocephalic Eyes-EOMI, PERRLA, sclera are non icteric Neck-supple Heart: regular rate and rhythm and systolic murmur grade III/VI Lungs: clear to auscultation bilaterally Abdomen: soft, non-tender; bowel sounds normal; no masses,  no organomegaly Extremities: extremities normal, atraumatic, no cyanosis or edema. Pulses intact. Neurologic: intact  Diagnostic Studies and Laboratory Results: No results found for this or any previous visit (from the past 48 hour(s)). No results found.  Assessment/Plan NSTEMI- CAD (left main included). Emergent CABG  Ardelle Balls, PA-C PA-C 04/18/2013, 1:56 PM

## 2013-04-18 NOTE — Anesthesia Preprocedure Evaluation (Signed)
Anesthesia Evaluation  Patient identified by MRN, date of birth, ID band Patient awake    Reviewed: Allergy & Precautions, H&P , NPO status , Patient's Chart, lab work & pertinent test results  History of Anesthesia Complications Negative for: history of anesthetic complications  Airway Mallampati: II TM Distance: >3 FB Neck ROM: Full    Dental  (+) Poor Dentition and Dental Advisory Given   Pulmonary neg pulmonary ROS,    Pulmonary exam normal       Cardiovascular negative cardio ROS      Neuro/Psych negative neurological ROS  negative psych ROS   GI/Hepatic negative GI ROS, (+)     substance abuse  alcohol use,   Endo/Other  negative endocrine ROS  Renal/GU negative Renal ROS     Musculoskeletal   Abdominal   Peds  Hematology   Anesthesia Other Findings   Reproductive/Obstetrics                           Anesthesia Physical Anesthesia Plan  ASA: IV and emergent  Anesthesia Plan: General   Post-op Pain Management:    Induction: Intravenous  Airway Management Planned: Oral ETT  Additional Equipment: Arterial line and PA Cath  Intra-op Plan:   Post-operative Plan: Post-operative intubation/ventilation  Informed Consent: I have reviewed the patients History and Physical, chart, labs and discussed the procedure including the risks, benefits and alternatives for the proposed anesthesia with the patient or authorized representative who has indicated his/her understanding and acceptance.   Dental advisory given  Plan Discussed with: CRNA, Anesthesiologist and Surgeon  Anesthesia Plan Comments:         Anesthesia Quick Evaluation

## 2013-04-18 NOTE — Transfer of Care (Signed)
Immediate Anesthesia Transfer of Care Note  Patient: Vernon Patterson  Procedure(s) Performed: Procedure(s) with comments: CORONARY ARTERY BYPASS GRAFTING (CABG) (N/A) - Times  3  using left internal mammary artery and endoscopically harvested right saphenous vein INTRAOPERATIVE TRANSESOPHAGEAL ECHOCARDIOGRAM (N/A) ENDOVEIN HARVEST OF GREATER SAPHENOUS VEIN (Right) - Some of the saphenous vein from the lower leg also used.  Patient Location: SICU  Anesthesia Type:General  Level of Consciousness: sedated, unresponsive and Patient remains intubated per anesthesia plan  Airway & Oxygen Therapy: Patient remains intubated per anesthesia plan and Patient placed on Ventilator (see vital sign flow sheet for setting)  Post-op Assessment: Report given to PACU RN and Post -op Vital signs reviewed and stable  Post vital signs: Reviewed and stable  Complications: No apparent anesthesia complications

## 2013-04-19 ENCOUNTER — Inpatient Hospital Stay (HOSPITAL_COMMUNITY): Payer: Self-pay

## 2013-04-19 ENCOUNTER — Encounter (HOSPITAL_COMMUNITY): Payer: Self-pay | Admitting: Anesthesiology

## 2013-04-19 LAB — CREATININE, SERUM
Creatinine, Ser: 0.68 mg/dL (ref 0.50–1.35)
Creatinine, Ser: 0.72 mg/dL (ref 0.50–1.35)
GFR calc Af Amer: >90 mL/min (ref >90)
GFR calc non Af Amer: >90 mL/min (ref >90)

## 2013-04-19 LAB — POCT I-STAT, CHEM 8
BUN: 5 mg/dL — ABNORMAL LOW (ref 6–23)
Calcium, Ion: 1.17 mmol/L (ref 1.12–1.23)
Calcium, Ion: 1.17 mmol/L (ref 1.12–1.23)
Chloride: 105 mEq/L (ref 96–112)
Creatinine, Ser: 0.9 mg/dL (ref 0.50–1.35)
Glucose, Bld: 133 mg/dL — ABNORMAL HIGH (ref 70–99)
Glucose, Bld: 131 mg/dL — ABNORMAL HIGH (ref 70–99)
HCT: 17 % — ABNORMAL LOW (ref 39.0–52.0)
Hemoglobin: 5.8 g/dL — CL (ref 13.0–17.0)
Potassium: 3.6 mEq/L (ref 3.5–5.1)
Potassium: 4.4 mEq/L (ref 3.5–5.1)
Sodium: 143 mEq/L (ref 135–145)
Sodium: 139 mEq/L (ref 135–145)
TCO2: 22 mmol/L (ref 0–100)

## 2013-04-19 LAB — GLUCOSE, CAPILLARY
Glucose-Capillary: 105 mg/dL — ABNORMAL HIGH (ref 70–99)
Glucose-Capillary: 121 mg/dL — ABNORMAL HIGH (ref 70–99)
Glucose-Capillary: 121 mg/dL — ABNORMAL HIGH (ref 70–99)
Glucose-Capillary: 123 mg/dL — ABNORMAL HIGH (ref 70–99)
Glucose-Capillary: 123 mg/dL — ABNORMAL HIGH (ref 70–99)
Glucose-Capillary: 124 mg/dL — ABNORMAL HIGH (ref 70–99)
Glucose-Capillary: 131 mg/dL — ABNORMAL HIGH (ref 70–99)
Glucose-Capillary: 134 mg/dL — ABNORMAL HIGH (ref 70–99)
Glucose-Capillary: 136 mg/dL — ABNORMAL HIGH (ref 70–99)
Glucose-Capillary: 145 mg/dL — ABNORMAL HIGH (ref 70–99)
Glucose-Capillary: 146 mg/dL — ABNORMAL HIGH (ref 70–99)
Glucose-Capillary: 168 mg/dL — ABNORMAL HIGH (ref 70–99)
Glucose-Capillary: 71 mg/dL (ref 70–99)
Glucose-Capillary: 89 mg/dL (ref 70–99)

## 2013-04-19 LAB — POCT I-STAT 3, ART BLOOD GAS (G3+)
Acid-base deficit: 3 mmol/L — ABNORMAL HIGH (ref 0.0–2.0)
Acid-base deficit: 5 mmol/L — ABNORMAL HIGH (ref 0.0–2.0)
Bicarbonate: 23.1 mEq/L (ref 20.0–24.0)
O2 Saturation: 99 %
Patient temperature: 37.1
TCO2: 25 mmol/L (ref 0–100)
TCO2: 22 mmol/L (ref 0–100)
pCO2 arterial: 49.4 mmHg — ABNORMAL HIGH (ref 35.0–45.0)
pCO2 arterial: 40.4 mmHg (ref 35.0–45.0)
pH, Arterial: 7.279 — ABNORMAL LOW (ref 7.350–7.450)
pO2, Arterial: 142 mmHg — ABNORMAL HIGH (ref 80.0–100.0)

## 2013-04-19 LAB — CBC
HCT: 18.4 % — ABNORMAL LOW (ref 39.0–52.0)
Hemoglobin: 6.5 g/dL — CL (ref 13.0–17.0)
MCH: 32.4 pg (ref 26.0–34.0)
MCH: 32 pg (ref 26.0–34.0)
MCHC: 35.2 g/dL (ref 30.0–36.0)
MCHC: 35.3 g/dL (ref 30.0–36.0)
MCHC: 35.7 g/dL (ref 30.0–36.0)
MCV: 90.7 fL (ref 78.0–100.0)
Platelets: 96 10*3/uL — ABNORMAL LOW (ref 150–400)
Platelets: 93 10*3/uL — ABNORMAL LOW (ref 150–400)
RBC: 1.87 MIL/uL — ABNORMAL LOW (ref 4.22–5.81)
RDW: 19.7 % — ABNORMAL HIGH (ref 11.5–15.5)

## 2013-04-19 LAB — BASIC METABOLIC PANEL
BUN: 7 mg/dL (ref 6–23)
CO2: 20 mEq/L (ref 19–32)
Calcium: 7.2 mg/dL — ABNORMAL LOW (ref 8.4–10.5)
Chloride: 106 mEq/L (ref 96–112)
Creatinine, Ser: 0.66 mg/dL (ref 0.50–1.35)
GFR calc non Af Amer: >90 mL/min (ref >90)
Sodium: 136 mEq/L (ref 135–145)

## 2013-04-19 LAB — PREPARE PLATELET PHERESIS: Unit division: 0

## 2013-04-19 LAB — MAGNESIUM
Magnesium: 2.5 mg/dL (ref 1.5–2.5)
Magnesium: 2.6 mg/dL — ABNORMAL HIGH (ref 1.5–2.5)
Magnesium: 3.3 mg/dL — ABNORMAL HIGH (ref 1.5–2.5)

## 2013-04-19 LAB — BLOOD PRODUCT ORDER (VERBAL) VERIFICATION

## 2013-04-19 MED ORDER — INSULIN ASPART 100 UNIT/ML ~~LOC~~ SOLN
0.0000 [IU] | SUBCUTANEOUS | Status: DC
  Administered 2013-04-19 – 2013-04-20 (×6): 2 [IU] via SUBCUTANEOUS

## 2013-04-19 MED ORDER — INSULIN DETEMIR 100 UNIT/ML ~~LOC~~ SOLN
15.0000 [IU] | Freq: Once | SUBCUTANEOUS | Status: AC
  Administered 2013-04-19: 15 [IU] via SUBCUTANEOUS
  Filled 2013-04-19: qty 0.15

## 2013-04-19 MED ORDER — SODIUM BICARBONATE 8.4 % IV SOLN
50.0000 meq | Freq: Once | INTRAVENOUS | Status: AC
  Administered 2013-04-19: 50 meq via INTRAVENOUS

## 2013-04-19 MED ORDER — POTASSIUM CHLORIDE 10 MEQ/50ML IV SOLN
10.0000 meq | INTRAVENOUS | Status: AC
  Administered 2013-04-19 (×3): 10 meq via INTRAVENOUS

## 2013-04-19 MED ORDER — INSULIN DETEMIR 100 UNIT/ML ~~LOC~~ SOLN
15.0000 [IU] | Freq: Every day | SUBCUTANEOUS | Status: DC
  Filled 2013-04-19: qty 0.15

## 2013-04-19 MED FILL — Heparin Sodium (Porcine) Inj 1000 Unit/ML: INTRAMUSCULAR | Qty: 10 | Status: AC

## 2013-04-19 MED FILL — Sodium Chloride Irrigation Soln 0.9%: Qty: 3000 | Status: AC

## 2013-04-19 MED FILL — Lidocaine HCl IV Inj 20 MG/ML: INTRAVENOUS | Qty: 5 | Status: AC

## 2013-04-19 MED FILL — Sodium Chloride IV Soln 0.9%: INTRAVENOUS | Qty: 2000 | Status: AC

## 2013-04-19 MED FILL — Sodium Bicarbonate IV Soln 8.4%: INTRAVENOUS | Qty: 50 | Status: AC

## 2013-04-19 MED FILL — Heparin Sodium (Porcine) 100 Unt/ML in Sodium Chloride 0.45%: INTRAMUSCULAR | Qty: 250 | Status: AC

## 2013-04-19 MED FILL — Mannitol IV Soln 20%: INTRAVENOUS | Qty: 500 | Status: AC

## 2013-04-19 MED FILL — Electrolyte-R (PH 7.4) Solution: INTRAVENOUS | Qty: 4000 | Status: AC

## 2013-04-19 NOTE — Progress Notes (Signed)
Pt's hemoglobin and hematocrit according to I-Stat was 5.8 and 17. CBC showed H/H was 6.5/18.4. Pt's chest tube output was 150-200 cc/hour before the unit of platelets was given at 2040. After platelets given, chest tube output was 80-100 cc/hour. Chest tubes forming clots and not draining very well. Chest tubes were manipulated and repositioned to prevent clotting and increase flow. BP labile with SBP decreasing to 70s and 80s when pt starts to wake up while weaning from vent. ABG before extubation showed pH 7.28, pCO2 49.4, pO2 110, HCO3 23. Dr. Laneta Simmers notified of pt's hemoglobin, chest tube output, blood pressure, and ABG results. Order given for 2 units PRBCs and follow up CBC. Also, order given to hold extubation until later this morning during MD rounds. Will implement orders and continue to assess.   Alfonso Ellis, RN

## 2013-04-19 NOTE — Procedures (Signed)
Extubation Procedure Note  Patient Details:   Name: Algie Cazeau DOB: 1955-09-09 MRN: [id number]   Airway Documentation:     Evaluation  O2 sats: stable throughout Complications: No apparent complications Patient did tolerate procedure well. Bilateral Breath Sounds: Clear Suctioning: Airway Yes  Pt able to speak and is oriented to person and place.   Devra Dopp D 04/19/2013, 8:58 AM

## 2013-04-19 NOTE — Progress Notes (Signed)
Patient ID: Vernon Patterson, male   DOB: 11/05/1955, 57 y.o.   MRN: [id number] EVENING ROUNDS NOTE :     [address]       [place] [address]             [phone]                 1 Day Post-Op Procedure(s) (LRB): CORONARY ARTERY BYPASS GRAFTING (CABG) (N/A) INTRAOPERATIVE TRANSESOPHAGEAL ECHOCARDIOGRAM (N/A) ENDOVEIN HARVEST OF GREATER SAPHENOUS VEIN (Right)  Total Length of Stay:  LOS: 1 day  BP 99/60  Pulse 90  Temp(Src) 98.4 F (36.9 C) (Oral)  Resp 21  Ht 5\' 9"  (1.753 m)  Wt 190 lb 7.6 oz (86.4 kg)  BMI 28.12 kg/m2  SpO2 100%  .Intake/Output     12/08 0701 - 12/09 0700 12/09 0701 - 12/10 0700   I.V. (mL/kg) 1214.1 (14.1) 488.4 (5.7)   Blood 1648.5    IV Piggyback 1700 350   Total Intake(mL/kg) 4562.6 (52.8) 838.4 (9.7)   Urine (mL/kg/hr) 2805 520 (0.5)   Blood 400    Chest Tube 1270 280 (0.3)   Total Output 4475 800   Net +87.6 +38.4          . sodium chloride 20 mL/hr at 04/18/13 1900  . sodium chloride 20 mL/hr at 04/18/13 1900  . sodium chloride    . dexmedetomidine 0.7 mcg/kg/hr (04/19/13 0230)  . insulin (NOVOLIN-R) infusion Stopped (04/19/13 1400)  . lactated ringers 20 mL/hr at 04/18/13 1900  . nitroGLYCERIN Stopped (04/18/13 1900)  . phenylephrine (NEO-SYNEPHRINE) Adult infusion 20 mcg/min (04/19/13 1700)     Lab Results  Component Value Date   WBC 7.9 04/19/2013   HGB 8.0* 04/19/2013   HCT 22.7* 04/19/2013   PLT 93* 04/19/2013   GLUCOSE 131* 04/19/2013   NA 139 04/19/2013   K 4.4 04/19/2013   CL 105 04/19/2013   CREATININE 0.68 04/19/2013   BUN 5* 04/19/2013   CO2 20 04/19/2013   INR 1.48 04/18/2013   Still on neo aai paced for rate sitting in chair K 4.0  Delight Ovens MD  Beeper [phone] Office [phone] 04/19/2013 6:26 PM

## 2013-04-19 NOTE — Progress Notes (Signed)
1 Day Post-Op Procedure(s) (LRB): CORONARY ARTERY BYPASS GRAFTING (CABG) (N/A) INTRAOPERATIVE TRANSESOPHAGEAL ECHOCARDIOGRAM (N/A) ENDOVEIN HARVEST OF GREATER SAPHENOUS VEIN (Right) Subjective: Intubated but awake and alert. Ready to get extubated  Objective: Vital signs in last 24 hours: Temp:  [94.5 F (34.7 C)-100.2 F (37.9 C)] 99.9 F (37.7 C) (12/09 0700) Pulse Rate:  [79-90] 88 (12/09 0700) Cardiac Rhythm:  [-] Atrial paced (12/09 0700) Resp:  [12-27] 19 (12/09 0700) BP: (77-134)/(42-99) 110/75 mmHg (12/09 0700) SpO2:  [92 %-100 %] 96 % (12/09 0700) Arterial Line BP: (77-135)/(26-89) 104/59 mmHg (12/09 0700) FiO2 (%):  [40 %-50 %] 50 % (12/09 0700) Weight:  [75 kg (165 lb 5.5 oz)-86.4 kg (190 lb 7.6 oz)] 86.4 kg (190 lb 7.6 oz) (12/09 0500)  Hemodynamic parameters for last 24 hours: PAP: (18-72)/(10-35) 35/19 mmHg CO:  [3.2 L/min-6.1 L/min] 5.4 L/min CI:  [1.7 L/min/m2-3.2 L/min/m2] 2.8 L/min/m2  Intake/Output from previous day: 12/08 0701 - 12/09 0700 In: 4562.6 [I.V.:1214.1; Blood:1648.5; IV Piggyback:1700] Out: 4475 [Urine:2805; Blood:400; Chest Tube:1270] Intake/Output this shift:    General appearance: alert and cooperative Neurologic: intact Heart: regular rate and rhythm, S1, S2 norma, 1/6 systolic murmur over aorta. Extremities: edema mild Wound: dressing dry  Lab Results:  Recent Labs  04/18/13 2345 04/19/13 0620  WBC 5.7 5.9  HGB 6.5* 9.1*  HCT 18.4* 25.5*  PLT 134* PENDING   BMET:  Recent Labs  04/18/13 1856 04/18/13 2341 04/18/13 2345  NA 141 143  --   K 3.9 3.6  --   CL  --  104  --   GLUCOSE 105* 133*  --   BUN  --  5*  --   CREATININE  --  0.90 0.72    PT/INR:  Recent Labs  04/18/13 1856  LABPROT 17.5*  INR 1.48   ABG    Component Value Date/Time   PHART 7.279* 04/19/2013 0018   HCO3 23.1 04/19/2013 0018   TCO2 25 04/19/2013 0018   ACIDBASEDEF 3.0* 04/19/2013 0018   O2SAT 98.0 04/19/2013 0018   CBG (last 3)  No results  found for this basename: GLUCAP,  in the last 72 hours  CXR: mild left base atelectasis.  Assessment/Plan: S/P Procedure(s) (LRB): CORONARY ARTERY BYPASS GRAFTING (CABG) (N/A) INTRAOPERATIVE TRANSESOPHAGEAL ECHOCARDIOGRAM (N/A) ENDOVEIN HARVEST OF GREATER SAPHENOUS VEIN (Right) He has been hemodynamically stable overnight but required 2 units of PRBC's due to drop in Hgb related to chest tube output from preop Plavix. It looks like this is continuing to decrease. Will keep chest tubes in today.  Extubate  Continue foley due to diuresing patient and patient in ICU  See progression orders   LOS: 1 day    Darlys Buis K 04/19/2013

## 2013-04-20 ENCOUNTER — Inpatient Hospital Stay (HOSPITAL_COMMUNITY): Payer: Self-pay

## 2013-04-20 LAB — GLUCOSE, CAPILLARY
Glucose-Capillary: 105 mg/dL — ABNORMAL HIGH (ref 70–99)
Glucose-Capillary: 122 mg/dL — ABNORMAL HIGH (ref 70–99)
Glucose-Capillary: 127 mg/dL — ABNORMAL HIGH (ref 70–99)

## 2013-04-20 LAB — CBC
HCT: 19.8 % — ABNORMAL LOW (ref 39.0–52.0)
Hemoglobin: 7 g/dL — ABNORMAL LOW (ref 13.0–17.0)
MCH: 32.6 pg (ref 26.0–34.0)
MCHC: 35.4 g/dL (ref 30.0–36.0)
MCV: 92.1 fL (ref 78.0–100.0)
Platelets: 79 K/uL — ABNORMAL LOW (ref 150–400)
RBC: 2.15 MIL/uL — ABNORMAL LOW (ref 4.22–5.81)
RDW: 20 % — ABNORMAL HIGH (ref 11.5–15.5)
WBC: 6.5 K/uL (ref 4.0–10.5)

## 2013-04-20 LAB — BASIC METABOLIC PANEL WITH GFR
BUN: 8 mg/dL (ref 6–23)
CO2: 24 meq/L (ref 19–32)
Calcium: 7.4 mg/dL — ABNORMAL LOW (ref 8.4–10.5)
Chloride: 108 meq/L (ref 96–112)
Creatinine, Ser: 0.6 mg/dL (ref 0.50–1.35)
GFR calc Af Amer: >90 mL/min
GFR calc non Af Amer: >90 mL/min
Glucose, Bld: 103 mg/dL — ABNORMAL HIGH (ref 70–99)
Potassium: 3.9 meq/L (ref 3.5–5.1)
Sodium: 136 meq/L (ref 135–145)

## 2013-04-20 LAB — PREPARE RBC (CROSSMATCH)

## 2013-04-20 MED ORDER — POTASSIUM CHLORIDE CRYS ER 20 MEQ PO TBCR
40.0000 meq | EXTENDED_RELEASE_TABLET | Freq: Once | ORAL | Status: AC
  Administered 2013-04-20: 40 meq via ORAL
  Filled 2013-04-20: qty 2

## 2013-04-20 MED ORDER — INSULIN DETEMIR 100 UNIT/ML ~~LOC~~ SOLN
10.0000 [IU] | Freq: Every day | SUBCUTANEOUS | Status: DC
  Administered 2013-04-20 – 2013-04-21 (×2): 10 [IU] via SUBCUTANEOUS
  Filled 2013-04-20 (×3): qty 0.1

## 2013-04-20 MED ORDER — METOPROLOL TARTRATE 12.5 MG HALF TABLET: 12.5000 mg | ORAL_TABLET | Freq: Two times a day (BID) | ORAL | Status: DC

## 2013-04-20 MED ORDER — METOPROLOL TARTRATE 25 MG/10 ML ORAL SUSPENSION: 12.5000 mg | Freq: Two times a day (BID) | ORAL | Status: DC

## 2013-04-20 MED ORDER — FUROSEMIDE 10 MG/ML IJ SOLN
40.0000 mg | Freq: Once | INTRAMUSCULAR | Status: AC
  Administered 2013-04-20: 40 mg via INTRAVENOUS
  Filled 2013-04-20: qty 4

## 2013-04-20 MED FILL — Potassium Chloride Inj 2 mEq/ML: INTRAVENOUS | Qty: 40 | Status: AC

## 2013-04-20 MED FILL — Heparin Sodium (Porcine) Inj 1000 Unit/ML: INTRAMUSCULAR | Qty: 30 | Status: AC

## 2013-04-20 MED FILL — Magnesium Sulfate Inj 50%: INTRAMUSCULAR | Qty: 10 | Status: AC

## 2013-04-20 MED FILL — Sodium Chloride IV Soln 0.9%: INTRAVENOUS | Qty: 1000 | Status: AC

## 2013-04-20 NOTE — Progress Notes (Signed)
POD # 2 CABG x 3  Resting comfortably  BP 125/68  Pulse 86  Temp(Src) 97.9 F (36.6 C) (Oral)  Resp 22  Ht 5\' 9"  (1.753 m)  Wt 187 lb 6.4 oz (85.004 kg)  BMI 27.66 kg/m2  SpO2 93%   Intake/Output Summary (Last 24 hours) at 04/20/13 1750 Last data filed at 04/20/13 1700  Gross per 24 hour  Intake 1406.7 ml  Output   2700 ml  Net -1293.3 ml    Lasix and potassium ordered for this evening

## 2013-04-20 NOTE — Progress Notes (Addendum)
TCTS DAILY ICU PROGRESS NOTE                   [address]            [place] [address]          [phone]   2 Days Post-Op Procedure(s) (LRB): CORONARY ARTERY BYPASS GRAFTING (CABG) (N/A) INTRAOPERATIVE TRANSESOPHAGEAL ECHOCARDIOGRAM (N/A) ENDOVEIN HARVEST OF GREATER SAPHENOUS VEIN (Right)  Total Length of Stay:  LOS: 2 days   Subjective: Patient with light headedness with ambulation  Objective: Vital signs in last 24 hours: Temp:  [98.1 F (36.7 C)-99.5 F (37.5 C)] 98.1 F (36.7 C) (12/10 0400) Pulse Rate:  [65-92] 90 (12/10 0800) Cardiac Rhythm:  [-] Atrial paced (12/10 0800) Resp:  [9-27] 17 (12/10 0800) BP: (79-116)/(50-69) 95/58 mmHg (12/10 0800) SpO2:  [93 %-100 %] 95 % (12/10 0800) Arterial Line BP: (85-143)/(39-67) 86/39 mmHg (12/10 0800) Weight:  [85.004 kg (187 lb 6.4 oz)] 85.004 kg (187 lb 6.4 oz) (12/10 0600)  Filed Weights   04/18/13 1852 04/19/13 0500 04/20/13 0600  Weight: 75 kg (165 lb 5.5 oz) 86.4 kg (190 lb 7.6 oz) 85.004 kg (187 lb 6.4 oz)    Weight change: 10.004 kg (22 lb 0.9 oz)   Hemodynamic parameters for last 24 hours: PAP: (30-40)/(13-17) 30/14 mmHg  Intake/Output from previous day: 12/09 0701 - 12/10 0700 In: 1265.3 [I.V.:865.3; IV Piggyback:400] Out: 1670 [Urine:1150; Chest Tube:520]  Intake/Output this shift: Total I/O In: 26 [I.V.:26] Out: 60 [Urine:60]  Current Meds: Scheduled Meds: . acetaminophen  1,000 mg Oral Q6H   Or  . acetaminophen (TYLENOL) oral liquid 160 mg/5 mL  1,000 mg Per Tube Q6H  . aspirin EC  325 mg Oral Daily   Or  . aspirin  324 mg Per Tube Daily  . bisacodyl  10 mg Oral Daily   Or  . bisacodyl  10 mg Rectal Daily  . cefUROXime (ZINACEF)  IV  1.5 g Intravenous Q12H  . docusate sodium  200 mg Oral Daily  . insulin aspart  0-24 Units Subcutaneous Q4H  . insulin detemir  15 Units Subcutaneous Daily  . insulin regular  0-10 Units Intravenous TID WC  . metoprolol tartrate  12.5 mg  Oral BID   Or  . metoprolol tartrate  12.5 mg Per Tube BID  . pantoprazole  40 mg Oral Daily  . sodium chloride  3 mL Intravenous Q12H   Continuous Infusions: . sodium chloride 20 mL/hr at 04/18/13 1900  . sodium chloride 20 mL/hr at 04/18/13 1900  . sodium chloride    . dexmedetomidine 0.7 mcg/kg/hr (04/19/13 0230)  . insulin (NOVOLIN-R) infusion Stopped (04/19/13 1400)  . lactated ringers 20 mL/hr at 04/18/13 1900  . nitroGLYCERIN Stopped (04/18/13 1900)  . phenylephrine (NEO-SYNEPHRINE) Adult infusion 8 mcg/min (04/20/13 0800)   PRN Meds:.metoprolol, midazolam, morphine injection, ondansetron (ZOFRAN) IV, oxyCODONE, sodium chloride  General appearance: alert, cooperative and no distress Neurologic: intact Heart: regular rate and rhythm and rub with chest tubes in place Lungs: diminished breath sounds bibasilar Abdomen: soft, non-tender; bowel sounds normal; no masses,  no organomegaly Extremities: Mild LE edema Wound: Dressings are clean and dry  Lab Results: CBC: Recent Labs  04/19/13 1602 04/20/13 0429  WBC 7.9 6.5  HGB 8.0* 7.0*  HCT 22.7* 19.8*  PLT 93* 79*   BMET:  Recent Labs  04/19/13 0620 04/19/13 1559 04/19/13 1602 04/20/13 0429  NA 136 139  --  136  K  4.6 4.4  --  3.9  CL 106 105  --  108  CO2 20  --   --  24  GLUCOSE 153* 131*  --  103*  BUN 7 5*  --  8  CREATININE 0.66 0.90 0.68 0.60  CALCIUM 7.2*  --   --  7.4*    PT/INR:  Recent Labs  04/18/13 1856  LABPROT 17.5*  INR 1.48   Radiology: Dg Chest Port 1 View  04/20/2013   CLINICAL DATA:  Post operative cardiac surgery  EXAM: PORTABLE CHEST - 1 VIEW  COMPARISON:  04/19/2013  FINDINGS: Evidence of CABG reidentified. Mediastinal drain, epicardial pacer wires, and left-sided chest tube remain in place. Endotracheal and nasogastric tubes as well as Swan-Ganz catheter have been removed. Right IJ sheath remains in place with tip over the mid SVC. Bilaterally posteriorly tracking small pleural  effusions are reidentified, with an element of loculation on the left. This obscures the lung bases. Superimposed left lower lobe consolidation is reidentified.  IMPRESSION: Bilateral small pleural effusions tracking posteriorly with stable retrocardiac airspace consolidation likely compressive atelectasis.   Electronically Signed   By: Christiana Pellant M.D.   On: 04/20/2013 07:47   Dg Chest Portable 1 View In Am  04/19/2013   CLINICAL DATA:  Postop  EXAM: PORTABLE CHEST - 1 VIEW  COMPARISON:  Portable exam 0647 hr compared to 04/18/2013  FINDINGS: Tip of endotracheal tube projects 4.6 cm above carinal.  Nasogastric tube extends into stomach.  Mediastinal drain and left thoracostomy tube unchanged.  Right jugular Swan-Ganz catheter tip projects over right pulmonary artery at the hilum, partially withdrawn since previous exam.  Enlargement of cardiac silhouette post CABG.  Bibasilar atelectasis.  Small left pleural effusion.  No pneumothorax.  Bones unremarkable.  IMPRESSION: Decreased lung volumes with bibasilar atelectasis and small left pleural effusion.   Electronically Signed   By: Ulyses Southward M.D.   On: 04/19/2013 09:29   Dg Chest Portable 1 View  04/18/2013   CLINICAL DATA:  Postoperative radiograph following CABG.  EXAM: PORTABLE CHEST - 1 VIEW  COMPARISON:  04/18/2013, 0436 hr.  FINDINGS: Low volume chest. This accentuates the size of the cardiopericardial silhouette. Right IJ vascular sheath is present transmitting a Swan-Ganz catheter. The tip of the Swan-Ganz catheter is in the right lower lobe pulmonary artery. This should be retracted about 4 cm for more central positioning and prevent vascular complications. Mediastinal drain and left thoracostomy tubes are present. New median sternotomy wires. Endotracheal tube tip is 2.8 cm from the carina. Enteric tube is present with the tip in the gastric fundus.  There is new right upper lobe atelectasis.  IMPRESSION: 1. Right IJ vascular sheath transmitting  a Swan-Ganz catheter with the tip in the right lower lobe pulmonary artery. Swan-Ganz catheter should be retracted about 4 cm for more central positioning to prevent vascular complication. 2. Other support apparatus appears in good position. 3. Low volume chest status post CABG/median sternotomy. 4. Right upper lobe atelectasis.   Electronically Signed   By: Andreas Newport M.D.   On: 04/18/2013 19:44     Assessment/Plan: S/P Procedure(s) (LRB): CORONARY ARTERY BYPASS GRAFTING (CABG) (N/A) INTRAOPERATIVE TRANSESOPHAGEAL ECHOCARDIOGRAM (N/A) ENDOVEIN HARVEST OF GREATER SAPHENOUS VEIN (Right)  1.CV- A paced at 90. On Neo synephrine drip at 8. Also, on Lopressor 12.5 bid with parameters. Will stop for now. 2.Pulmonary-chest tube with 520 cc of output last 24 hours. CXR this am shows no pneumothorax, bilateral pleural effusions and atelectasis.  Will wean as oxygen as tolerates. Encourage incentive spirometer and flutter valve. 3.Volume overload-diurese as blood pressure allows 4.ABL anemia-H and H decreased to 7 and 19.8. Likely needs transfusion and this should help with hyopotension 5. Thrombocytopenia-platelets down to 79,000. He was loaded with Plavix pre op. 6. CBGs 132/123/108. Pre op HGA1C 5.6. Continue Levemir for now. Will stop accu checks 48 hours after transfer  Elenore Rota 04/20/2013 8:47 AM  Chart reviewed, patient examined, agree with above. He has symptomatic acute blood loss anemia with Hgb 7.0. Will transfuse 2 units prbc's today and diurese excess volume. Discussed with the patient and he agrees. This should help with weaning the neo. Remove MT's today and keep pleural tube in.

## 2013-04-20 NOTE — Care Management Note (Unsigned)
    Page 1 of 1   04/22/2013     4:18:27 PM   CARE MANAGEMENT NOTE 04/22/2013  Patient:  Vernon Patterson, Vernon Patterson   Account Number:  [id number]  Date Initiated:  04/19/2013  Documentation initiated by:  [name]  Subjective/Objective Assessment:   Post op emergent CABG x2     Action/Plan:   Anticipated DC Date:  04/25/2013   Anticipated DC Plan:  HOME W HOME HEALTH SERVICES      DC Planning Services  CM consult      Choice offered to / List presented to:             Status of service:  In process, will continue to follow Medicare Important Message given?   (If response is "NO", the following Medicare IM given date fields will be blank) Date Medicare IM given:   Date Additional Medicare IM given:    Discharge Disposition:    Per UR Regulation:  Reviewed for med. necessity/level of care/duration of stay  If discussed at Long Length of Stay Meetings, dates discussed:    Comments:  Contact:  [place] Daughter [phone]  04/22/13 Tyrik Stetzer,RN,BSN [phone] PT DOES NOT QUALIFY FOR SKILLED NURSING, PER CSW, AS HE HAS NO PAYOR SOURCE.  WILL NEED TO DC HOME WITH FAMILY MEMBERS. WILL LIKELY QUALIFY FOR MATCH LETTER AT DC; WILL ASK WEEKEND CM TO FOLLOW UP.  04-19-13 11am [name], RNBSN [phone] Daughter and sister in room with patient.  States he lives with brother.  Explained will need 24/7 caregivers on discharge for 7-10 days.  At first sister stated they would work that out, then asked what option would be if they could not.  Hope to work out 24/7 coverage with family but will need to follow to see if this was worked out.

## 2013-04-21 ENCOUNTER — Encounter (HOSPITAL_COMMUNITY): Payer: Self-pay | Admitting: Surgery

## 2013-04-21 ENCOUNTER — Inpatient Hospital Stay (HOSPITAL_COMMUNITY): Payer: Self-pay

## 2013-04-21 LAB — BASIC METABOLIC PANEL
BUN: 10 mg/dL (ref 6–23)
CO2: 24 mEq/L (ref 19–32)
Creatinine, Ser: 0.59 mg/dL (ref 0.50–1.35)
GFR calc Af Amer: >90 mL/min (ref >90)
GFR calc non Af Amer: >90 mL/min (ref >90)
Potassium: 3.8 mEq/L (ref 3.5–5.1)

## 2013-04-21 LAB — TYPE AND SCREEN
ABO/RH(D): O NEG
Antibody Screen: NEGATIVE
Unit division: 0
Unit division: 0
Unit division: 0

## 2013-04-21 LAB — GLUCOSE, CAPILLARY
Glucose-Capillary: 98 mg/dL (ref 70–99)
Glucose-Capillary: 103 mg/dL — ABNORMAL HIGH (ref 70–99)
Glucose-Capillary: 103 mg/dL — ABNORMAL HIGH (ref 70–99)
Glucose-Capillary: 104 mg/dL — ABNORMAL HIGH (ref 70–99)
Glucose-Capillary: 96 mg/dL (ref 70–99)

## 2013-04-21 LAB — CBC
HCT: 26.1 % — ABNORMAL LOW (ref 39.0–52.0)
MCH: 31.5 pg (ref 26.0–34.0)
MCHC: 34.9 g/dL (ref 30.0–36.0)
MCV: 90.3 fL (ref 78.0–100.0)
RDW: 19.9 % — ABNORMAL HIGH (ref 11.5–15.5)

## 2013-04-21 MED ORDER — POTASSIUM CHLORIDE CRYS ER 20 MEQ PO TBCR
40.0000 meq | EXTENDED_RELEASE_TABLET | Freq: Every day | ORAL | Status: DC
  Administered 2013-04-22 – 2013-04-24 (×3): 40 meq via ORAL
  Filled 2013-04-21 (×3): qty 2

## 2013-04-21 MED ORDER — BISACODYL 10 MG RE SUPP: 10.0000 mg | Freq: Every day | RECTAL | Status: DC | PRN

## 2013-04-21 MED ORDER — SODIUM CHLORIDE 0.9 % IV SOLN: 250.0000 mL | INTRAVENOUS | Status: DC | PRN

## 2013-04-21 MED ORDER — MOVING RIGHT ALONG BOOK
Freq: Once | Status: AC
  Administered 2013-04-21: 16:00:00
  Filled 2013-04-21 (×2): qty 1

## 2013-04-21 MED ORDER — SODIUM CHLORIDE 0.9 % IJ SOLN: 3.0000 mL | INTRAMUSCULAR | Status: DC | PRN

## 2013-04-21 MED ORDER — FUROSEMIDE 10 MG/ML IJ SOLN
40.0000 mg | Freq: Once | INTRAMUSCULAR | Status: AC
  Administered 2013-04-21: 40 mg via INTRAVENOUS
  Filled 2013-04-21: qty 4

## 2013-04-21 MED ORDER — POTASSIUM CHLORIDE CRYS ER 20 MEQ PO TBCR
20.0000 meq | EXTENDED_RELEASE_TABLET | Freq: Every day | ORAL | Status: DC
  Administered 2013-04-21: 20 meq via ORAL
  Filled 2013-04-21: qty 1

## 2013-04-21 MED ORDER — ACETAMINOPHEN 325 MG PO TABS: 650.0000 mg | ORAL_TABLET | Freq: Four times a day (QID) | ORAL | Status: DC | PRN

## 2013-04-21 MED ORDER — ONDANSETRON HCL 4 MG PO TABS: 4.0000 mg | ORAL_TABLET | Freq: Four times a day (QID) | ORAL | Status: DC | PRN

## 2013-04-21 MED ORDER — DOCUSATE SODIUM 100 MG PO CAPS
200.0000 mg | ORAL_CAPSULE | Freq: Every day | ORAL | Status: DC
  Administered 2013-04-22 – 2013-04-24 (×3): 200 mg via ORAL
  Filled 2013-04-21 (×3): qty 2

## 2013-04-21 MED ORDER — ASPIRIN EC 325 MG PO TBEC
325.0000 mg | DELAYED_RELEASE_TABLET | Freq: Every day | ORAL | Status: DC
  Administered 2013-04-22 – 2013-04-24 (×3): 325 mg via ORAL
  Filled 2013-04-21 (×3): qty 1

## 2013-04-21 MED ORDER — CITALOPRAM HYDROBROMIDE 40 MG PO TABS
40.0000 mg | ORAL_TABLET | Freq: Every day | ORAL | Status: DC
  Administered 2013-04-21 – 2013-04-24 (×4): 40 mg via ORAL
  Filled 2013-04-21 (×5): qty 1

## 2013-04-21 MED ORDER — OXYCODONE HCL 5 MG PO TABS
5.0000 mg | ORAL_TABLET | ORAL | Status: DC | PRN
  Administered 2013-04-21 – 2013-04-23 (×7): 10 mg via ORAL
  Administered 2013-04-24 (×2): 5 mg via ORAL
  Filled 2013-04-21 (×5): qty 2
  Filled 2013-04-21: qty 1
  Filled 2013-04-21 (×3): qty 2

## 2013-04-21 MED ORDER — POTASSIUM CHLORIDE CRYS ER 20 MEQ PO TBCR
20.0000 meq | EXTENDED_RELEASE_TABLET | Freq: Once | ORAL | Status: AC
  Administered 2013-04-21: 20 meq via ORAL
  Filled 2013-04-21: qty 1

## 2013-04-21 MED ORDER — FUROSEMIDE 40 MG PO TABS
40.0000 mg | ORAL_TABLET | Freq: Every day | ORAL | Status: DC
  Administered 2013-04-22 – 2013-04-24 (×3): 40 mg via ORAL
  Filled 2013-04-21 (×3): qty 1

## 2013-04-21 MED ORDER — SIMVASTATIN 20 MG PO TABS
20.0000 mg | ORAL_TABLET | Freq: Every day | ORAL | Status: DC
  Administered 2013-04-21 – 2013-04-23 (×3): 20 mg via ORAL
  Filled 2013-04-21 (×4): qty 1

## 2013-04-21 MED ORDER — ONDANSETRON HCL 4 MG/2ML IJ SOLN: 4.0000 mg | Freq: Four times a day (QID) | INTRAMUSCULAR | Status: DC | PRN

## 2013-04-21 MED ORDER — FAMOTIDINE 20 MG PO TABS
20.0000 mg | ORAL_TABLET | Freq: Two times a day (BID) | ORAL | Status: DC
  Administered 2013-04-21 – 2013-04-24 (×6): 20 mg via ORAL
  Filled 2013-04-21 (×7): qty 1

## 2013-04-21 MED ORDER — INSULIN ASPART 100 UNIT/ML ~~LOC~~ SOLN: 0.0000 [IU] | Freq: Three times a day (TID) | SUBCUTANEOUS | Status: DC

## 2013-04-21 MED ORDER — TRAMADOL HCL 50 MG PO TABS: 50.0000 mg | ORAL_TABLET | ORAL | Status: DC | PRN

## 2013-04-21 MED ORDER — SODIUM CHLORIDE 0.9 % IJ SOLN
3.0000 mL | Freq: Two times a day (BID) | INTRAMUSCULAR | Status: DC
  Administered 2013-04-21 – 2013-04-23 (×5): 3 mL via INTRAVENOUS

## 2013-04-21 MED ORDER — BISACODYL 5 MG PO TBEC
10.0000 mg | DELAYED_RELEASE_TABLET | Freq: Every day | ORAL | Status: DC | PRN
  Administered 2013-04-22: 10 mg via ORAL
  Filled 2013-04-21: qty 2

## 2013-04-21 NOTE — Progress Notes (Signed)
Attempted to Cott report x 1  

## 2013-04-21 NOTE — Progress Notes (Addendum)
TCTS DAILY ICU PROGRESS NOTE                   [address]            [place] [address]          [phone]   3 Days Post-Op Procedure(s) (LRB): CORONARY ARTERY BYPASS GRAFTING (CABG) (N/A) INTRAOPERATIVE TRANSESOPHAGEAL ECHOCARDIOGRAM (N/A) ENDOVEIN HARVEST OF GREATER SAPHENOUS VEIN (Right)  Total Length of Stay:  LOS: 3 days   Subjective: Patient awake and alert. He has some pain at chest tube site;otherwise, no complaints.  Objective: Vital signs in last 24 hours: Temp:  [97.9 F (36.6 C)-98.8 F (37.1 C)] 98.4 F (36.9 C) (12/11 0400) Pulse Rate:  [73-91] 87 (12/11 0700) Cardiac Rhythm:  [-] Normal sinus rhythm (12/11 0700) Resp:  [16-27] 27 (12/11 0700) BP: (91-129)/(56-73) 109/58 mmHg (12/11 0700) SpO2:  [91 %-98 %] 94 % (12/11 0700) Arterial Line BP: (90-168)/(46-77) 165/65 mmHg (12/11 0700) Weight:  [84.1 kg (185 lb 6.5 oz)] 84.1 kg (185 lb 6.5 oz) (12/11 0500)  Filed Weights   04/19/13 0500 04/20/13 0600 04/21/13 0500  Weight: 86.4 kg (190 lb 7.6 oz) 85.004 kg (187 lb 6.4 oz) 84.1 kg (185 lb 6.5 oz)    Weight change: -0.904 kg (-1 lb 15.9 oz)       Intake/Output from previous day: 12/10 0701 - 12/11 0700 In: 1879.8 [P.O.:640; I.V.:489.8; Blood:700; IV Piggyback:50] Out: 3485 [Urine:3235; Chest Tube:250]  Intake/Output this shift:    Current Meds: Scheduled Meds: . acetaminophen  1,000 mg Oral Q6H   Or  . acetaminophen (TYLENOL) oral liquid 160 mg/5 mL  1,000 mg Per Tube Q6H  . aspirin EC  325 mg Oral Daily   Or  . aspirin  324 mg Per Tube Daily  . bisacodyl  10 mg Oral Daily   Or  . bisacodyl  10 mg Rectal Daily  . docusate sodium  200 mg Oral Daily  . insulin aspart  0-24 Units Subcutaneous Q4H  . insulin detemir  10 Units Subcutaneous Daily  . pantoprazole  40 mg Oral Daily  . sodium chloride  3 mL Intravenous Q12H   Continuous Infusions: . sodium chloride 20 mL/hr at 04/18/13 1900  . sodium chloride 20 mL/hr at  04/20/13 1231  . sodium chloride    . dexmedetomidine 0.7 mcg/kg/hr (04/19/13 0230)  . lactated ringers 20 mL/hr at 04/18/13 1900  . nitroGLYCERIN Stopped (04/18/13 1900)  . phenylephrine (NEO-SYNEPHRINE) Adult infusion 5 mcg/min (04/20/13 0900)   PRN Meds:.morphine injection, ondansetron (ZOFRAN) IV, oxyCODONE, sodium chloride  General appearance: alert, cooperative and no distress Neurologic: intact Heart: regular rate and rhythm and rub with chest tubes in place Lungs: diminished breath sounds bibasilar Abdomen: soft, non-tender; bowel sounds normal; no masses,  no organomegaly Extremities: Mild LE edema Wound: Dressings are clean and dry  Lab Results: CBC:  Recent Labs  04/20/13 0429 04/21/13 0410  WBC 6.5 7.2  HGB 7.0* 9.1*  HCT 19.8* 26.1*  PLT 79* 91*   BMET:   Recent Labs  04/20/13 0429 04/21/13 0410  NA 136 133*  K 3.9 3.8  CL 108 102  CO2 24 24  GLUCOSE 103* 101*  BUN 8 10  CREATININE 0.60 0.59  CALCIUM 7.4* 7.9*    PT/INR:   Recent Labs  04/18/13 1856  LABPROT 17.5*  INR 1.48   Radiology: Dg Chest Port 1 View  04/20/2013   CLINICAL DATA:  Post operative cardiac  surgery  EXAM: PORTABLE CHEST - 1 VIEW  COMPARISON:  04/19/2013  FINDINGS: Evidence of CABG reidentified. Mediastinal drain, epicardial pacer wires, and left-sided chest tube remain in place. Endotracheal and nasogastric tubes as well as Swan-Ganz catheter have been removed. Right IJ sheath remains in place with tip over the mid SVC. Bilaterally posteriorly tracking small pleural effusions are reidentified, with an element of loculation on the left. This obscures the lung bases. Superimposed left lower lobe consolidation is reidentified.  IMPRESSION: Bilateral small pleural effusions tracking posteriorly with stable retrocardiac airspace consolidation likely compressive atelectasis.   Electronically Signed   By: Christiana Pellant M.D.   On: 04/20/2013 07:47     Assessment/Plan: S/P  Procedure(s) (LRB): CORONARY ARTERY BYPASS GRAFTING (CABG) (N/A) INTRAOPERATIVE TRANSESOPHAGEAL ECHOCARDIOGRAM (N/A) ENDOVEIN HARVEST OF GREATER SAPHENOUS VEIN (Right)  1.CV-SR/PVCs in the low 80's.Neo syneprhine was stopped yesterday. Will start low dose Lopressor. 2.Pulmonary-chest tube with 250 cc of output last 24 hours. CXR this am appears to show low lung volumes, no pneumothorax, bilateral pleural effusions and atelectasis. Hope to remove pleural ct soon.Encourage incentive spirometer and flutter valve. Nurse reports he did desat yesterday. On 4 liters of oxygen via  this am. 3.Volume overload-Give Lasix IV this am 4.ABL anemia-H and H increased to 9.1 and 26.1 after transfusion 5. Thrombocytopenia-platelets up to to 91,000. He was loaded with Plavix pre op. 6. CBGs 122/127/98. Pre op HGA1C 5.6. Continue Levemir for now. Will stop accu checks 48 hours after transfer 7.Supplement potassium 8. Will start statin 9. Remove a line and foley 10. Hope to transfer to [facility] soon  Elenore Rota 04/21/2013 8:11 AM  Chart reviewed, patient examined, agree with above. He does have some asymmetric edema in the right lung this am. Will continue diuresis. I am not sure what his baseline weight is since he came right from [place] to the holding area.

## 2013-04-21 NOTE — Progress Notes (Signed)
Patient transported over from 2S by Sharyl Nimrod, Charity fundraiser. Patient placed on 2 west telemetry. Assessed patient, VS WNL. Patient is stable Vernon Patterson R

## 2013-04-21 NOTE — Progress Notes (Signed)
Pt transferred to 2W-33 via ambulation. Belongings at side. Family notified of room change. Meds in chart. VS stable at time of transfer. No questions or complaints at this time. Hand off to RN, Sharee Holster, Berk Pilot L

## 2013-04-21 NOTE — Progress Notes (Signed)
Report called to [place], 2W RN.

## 2013-04-22 ENCOUNTER — Inpatient Hospital Stay (HOSPITAL_COMMUNITY): Payer: Self-pay

## 2013-04-22 LAB — GLUCOSE, CAPILLARY
Glucose-Capillary: 90 mg/dL (ref 70–99)
Glucose-Capillary: 90 mg/dL (ref 70–99)
Glucose-Capillary: 98 mg/dL (ref 70–99)

## 2013-04-22 MED ORDER — LISINOPRIL 5 MG PO TABS
5.0000 mg | ORAL_TABLET | Freq: Every day | ORAL | Status: DC
  Administered 2013-04-22 – 2013-04-24 (×3): 5 mg via ORAL
  Filled 2013-04-22 (×3): qty 1

## 2013-04-22 MED ORDER — METOPROLOL TARTRATE 12.5 MG HALF TABLET
12.5000 mg | ORAL_TABLET | Freq: Two times a day (BID) | ORAL | Status: DC
  Administered 2013-04-22 – 2013-04-24 (×5): 12.5 mg via ORAL
  Filled 2013-04-22 (×6): qty 1

## 2013-04-22 NOTE — Progress Notes (Signed)
CSW contacted Gavin Pound in financial counseling and patient does not qualify for medicaid because this will not be a long term disability. Patient will not be able to go to [facility] will need to arrange supervision with family or friends. Clinical Social Worker will sign off for now as social work intervention is no longer needed. Please consult [place] again if new need arises.   Sabino Niemann, MSW, [employer] [phone]

## 2013-04-22 NOTE — Progress Notes (Signed)
CARDIAC REHAB PHASE I   PRE:  Rate/Rhythm: 83 SR    BP: standing 76/46    SaO2:   MODE:  Ambulation: 450 ft   POST:  Rate/Rhythm: 85 SR    BP: sitting 96/64     SaO2: 88-92 RA  Pt impulsive many times. Did not stumble when I held to him. BP low, denied dizziness. Danced in hallway and flashed nurses. Seemed to tolerate walk well however sister sts he is not telling us how he truly feels. SaO2 low upon return to room. Put O2 on for lying down. Family with him in room. Pt not confused and relaxed now in bed. 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 Will f/u. Needs education if d/c this weekend. [id number]  Elissa Lovett [place] CES, ACSM 04/22/2013 3:36 PM

## 2013-04-22 NOTE — Progress Notes (Signed)
[address]       [place] [address]             [phone]      4 Days Post-Op  Procedure(s) (LRB): CORONARY ARTERY BYPASS GRAFTING (CABG) (N/A) INTRAOPERATIVE TRANSESOPHAGEAL ECHOCARDIOGRAM (N/A) ENDOVEIN HARVEST OF GREATER SAPHENOUS VEIN (Right) Subjective: Feels ok, some clear productive sputum/cough  Objective  Telemetry sinus rhythm   Temp:  [98.3 F (36.8 C)-99 F (37.2 C)] 98.5 F (36.9 C) (12/12 0431) Pulse Rate:  [67-87] 77 (12/12 0431) Resp:  [16-25] 18 (12/12 0431) BP: (116-151)/(63-92) 117/64 mmHg (12/12 0431) SpO2:  [94 %-99 %] 98 % (12/12 0431) Weight:  [181 lb 3.2 oz (82.192 kg)] 181 lb 3.2 oz (82.192 kg) (12/12 0431)   Intake/Output Summary (Last 24 hours) at 04/22/13 0811 Last data filed at 04/22/13 0145  Gross per 24 hour  Intake   1163 ml  Output   1700 ml  Net   -537 ml       General appearance: alert, cooperative and no distress Heart: regular rate and rhythm and 2/vi systolic murmur Lungs: coarse BS Abdomen: benign Extremities: no sig edema Wound: incisions healing well  Lab Results:  Recent Labs  04/19/13 1602 04/20/13 0429 04/21/13 0410  NA  --  136 133*  K  --  3.9 3.8  CL  --  108 102  CO2  --  24 24  GLUCOSE  --  103* 101*  BUN  --  8 10  CREATININE 0.68 0.60 0.59  CALCIUM  --  7.4* 7.9*  MG 2.5  --   --    No results found for this basename: AST, ALT, ALKPHOS, BILITOT, PROT, ALBUMIN,  in the last 72 hours No results found for this basename: LIPASE, AMYLASE,  in the last 72 hours  Recent Labs  04/20/13 0429 04/21/13 0410  WBC 6.5 7.2  HGB 7.0* 9.1*  HCT 19.8* 26.1*  MCV 92.1 90.3  PLT 79* 91*   No results found for this basename: CKTOTAL, CKMB, TROPONINI,  in the last 72 hours No components found with this basename: POCBNP,  No results found for this basename: DDIMER,  in the last 72 hours No results found for this basename: HGBA1C,  in the last 72 hours No results found for this  basename: CHOL, HDL, LDLCALC, TRIG, CHOLHDL,  in the last 72 hours No results found for this basename: TSH, T4TOTAL, FREET3, T3FREE, THYROIDAB,  in the last 72 hours No results found for this basename: VITAMINB12, FOLATE, FERRITIN, TIBC, IRON, RETICCTPCT,  in the last 72 hours  Medications: Scheduled . aspirin EC  325 mg Oral Daily  . citalopram  40 mg Oral Daily  . docusate sodium  200 mg Oral Daily  . famotidine  20 mg Oral BID  . furosemide  40 mg Oral Daily  . insulin aspart  0-24 Units Subcutaneous TID AC & HS  . insulin detemir  10 Units Subcutaneous Daily  . potassium chloride  40 mEq Oral Daily  . simvastatin  20 mg Oral q1800  . sodium chloride  3 mL Intravenous Q12H     Radiology/Studies:  Dg Chest Port 1 View  04/21/2013   CLINICAL DATA:  Post CABG  EXAM: PORTABLE CHEST - 1 VIEW  COMPARISON:  04/20/2013; 04/19/2013; 04/18/2013; 04/16/2013  FINDINGS: Grossly unchanged enlarged cardiac silhouette and mediastinal contours post median sternotomy. Interval removal of mediastinal drain and medial left basilar chest tube. Otherwise, stable positioning  of the remaining support apparatus. No pneumothorax. The pulmonary vasculature is less distinct on the present examination, in particular, within the peripheral and lower as the right lung. Left mid and lower lung heterogeneous as consolidative opacities are grossly unchanged. Unchanged small left-sided effusion. No pneumothorax. Unchanged bones.  IMPRESSION: 1. Interval removal of mediastinal drain and medial left basilar chest tube. Otherwise, stable positioning of remaining support apparatus. No pneumothorax. 2. Findings suggestive of worsening asymmetric alveolar pulmonary edema, right greater than left.   Electronically Signed   By: Simonne Come M.D.   On: 04/21/2013 08:18    INR: Will add last result for INR, ABG once components are confirmed Will add last 4 CBG results once components are confirmed  Assessment/Plan: S/P  Procedure(s) (LRB): CORONARY ARTERY BYPASS GRAFTING (CABG) (N/A) INTRAOPERATIVE TRANSESOPHAGEAL ECHOCARDIOGRAM (N/A) ENDOVEIN HARVEST OF GREATER SAPHENOUS VEIN (Right)  1 doing well 2 push pulm toilet/ rehab as able 3 start low dose beta blocker and ace inhib 4 stop levimir 5 cont gentle diuresis 6 wants to see if he qualifies for [facility] as doesn't have much help at home 7 d/c epw's   LOS: 4 days    Marguerette Sheller E 12/12/20148:11 AM

## 2013-04-22 NOTE — Progress Notes (Signed)
      [address]       [place] [address]             [phone]     Asked to remove v-pacing wire by nurse as they were meeting resistance.   Wire removed intact without problem    Sharisse Rantz E, PA-C

## 2013-04-22 NOTE — Discharge Summary (Signed)
[address]       [place] [address]             [phone]       Emanuele Devargas January 07, 1956 56 y.o. [id number]  04/18/2013   Alleen Borne, MD  CAD  History of Presenting Illness:  This is a 57 year old Caucasian male, with cardiac risk factors which include hypertension and tobacco abuse,who presented to [facility] with chest pain. According to medical records, the patient described the chest pain as substernal, sharp, and denied pain radiation. Associated with it was shortness of breath.He has had the chest pain intermittently for 2 months but it worsened on 04/16/2013 so he presented for further evaluation and treatment. EKG showed normal sinus rhythm and nonspecific ST-T changes.Peak troponin was 0.29. He ruled in for a NSTEMI.He was given aspirin, loaded with Plavix, and put on a a heparin drip. He underwent a cardiac catheterization earlier today. He was found to a 90% left main stenosis, and a 70% proximal RCA with LVEF 55%. He was accepted in transfer to [facility] in order to undergo emergent CABG by Dr. Laneta Simmers. Currently, he is chest pain free.  Past Medical History:  1.Hypertension  2. Heart murmur  Past Surgical History:  Denies  Family History:  Mother is deceased about 2 weeks ago from a stroke. She also had diabetes and hypertension. Father is deceased from complications of pneumonia.  Social History:  Admits to smoking cigars daily (about 3 per day). Drinks about 4-5 cups of wine daily.  Allergies:  NKDA  No prescriptions prior to admission         Hospital Course:  The patient was admitted to the hospital and taken to the operating room on 04/18/2013 and underwent Procedure(s): 04/18/2013  Surgeon: Alleen Borne, MD  First Assistant: Doree Fudge, [facility]  Preoperative Diagnosis: Severe multi-vessel coronary artery disease  Postoperative Diagnosis: Same  Procedure:  1. Median  Sternotomy 2. Extracorporeal circulation 3. Emergent Coronary artery bypass grafting x 3  Left internal mammary graft to the LAD  SVG to OM1  SVG to PDA 4. Endoscopic vein harvest from the right leg  Anesthesia: General Endotracheal  The patient was then transported to the surgical intensive care unit in critical but stable condition.    Postoperative hospital course: The patient has overall progressed nicely. He was extubated without difficulty. He initially did require some pacer support as well as low dose Neo-Synephrine, but this was weaned without difficulty. He did have a acute blood loss anemia requiring transfusion. He also developed a postoperative thrombocytopenia and values are stabilizing. He had some moderate postoperative volume overload requiring gentle diuresis. All routine lines, monitors and drainage devices have been discontinued in the standard fashion. He is tolerating gradually increasing activities using standard protocols. Incisions are healing well without evidence of infection. He started on a low-dose ACE inhibitor and beta blocker at this time for moderate hypertension. Glucose has been under stable control using standard protocols. He is not a diabetic. He has had some pulmonary congestion which is improving using standard pulmonary toilet maneuvers. He has little assistance at home and we're investigating the possibility a short-term nursing facility stay versus returning home with his brother. Social work was consulted to assist with this matter. He did not qualify of Medicaid and he was unable to go to SNF. He has made arrangements with family and friends to assist him.He is ambulating well  on room air. Provided he has a bowel movement this am, he is felt surgically stable for discharge today.     No results found for this basename: NA, K, CL, CO2, GLUCOSE, BUN, CRATININE, CALCIUM,  in the last 72 hours No results found for this basename: WBC, HGB, HCT, PLT,  in the  last 72 hours No results found for this basename: INR,  in the last 72 hours   Discharge Instructions:  The patient is discharged to home with extensive instructions on wound care and progressive ambulation.  They are instructed not to drive or perform any heavy lifting until returning to see the physician in his office.  Discharge Diagnosis:  CAD  Secondary Diagnosis: Patient Active Problem List   Diagnosis Date Noted  . CAD (coronary artery disease) 04/18/2013   Past Medical History  Diagnosis Date  . Coronary artery disease   . Hypertension   . Anxiety   . Depression   . Heart murmur     Medications: At discharge   Medication List    STOP taking these medications       amLODipine 5 MG tablet  Commonly known as:  NORVASC      TAKE these medications       aspirin 325 MG EC tablet  Take 1 tablet (325 mg total) by mouth daily.     benazepril 10 MG tablet  Commonly known as:  LOTENSIN  Take 1 tablet (10 mg total) by mouth daily.     citalopram 40 MG tablet  Commonly known as:  CELEXA  Take 40 mg by mouth daily.     furosemide 40 MG tablet  Commonly known as:  LASIX  Take 1 tablet (40 mg total) by mouth daily. For 4 days then stop.     metoprolol tartrate 25 MG tablet  Commonly known as:  LOPRESSOR  Take 0.5 tablets (12.5 mg total) by mouth 2 (two) times daily.     oxyCODONE 5 MG immediate release tablet  Commonly known as:  Oxy IR/ROXICODONE  Take 1-2 tablets (5-10 mg total) by mouth every 4 (four) hours as needed for severe pain.     potassium chloride SA 20 MEQ tablet  Commonly known as:  K-DUR,KLOR-CON  Take 1 tablet (20 mEq total) by mouth daily. For 4 days then stop.     pravastatin 40 MG tablet  Commonly known as:  PRAVACHOL  Take 1 tablet (40 mg total) by mouth daily.           Follow-up Information   Follow up with [facility] MD. (2 weeks-please contact  office to arrange this appointment.)    Specialty:  Cardiology   Contact  information:   [address] [place] [address] [phone]       Follow up with Alleen Borne, MD. (05/18/2013 at 12 noon to see the surgeon. Please obtain a chest x-ray at 11 AM. Obtain chest x-ray from [facility]. [facility] is located in the same office complex.)    Specialty:  Cardiothoracic Surgery   Contact information:   [address] [place] [address] [phone]       Follow up with Alleen Borne, MD. (Nurse to remove chest tube sutures on Friday 04/29/2013. Office will Yera with appointment time)    Specialty:  Cardiothoracic Surgery   Contact information:   [address] [place] [address] [phone]       The patient has been discharged on:  1.Beta Blocker:  Yes [  y ]                              No   [   ]                              If No, reason:  2.Ace Inhibitor/ARB: Yes [ y ]                                     No  [    ]                                     If No, reason:  3.Statin:   Yes [ y  ]                  No  [   ]                  If No, reason:  4.Ecasa:  Yes  [ y  ]                  No   [   ]                  If No, reason:   Disposition:   Patient's condition is Good  Gershon Crane, PA-C 04/24/2013  8:25 AM

## 2013-04-22 NOTE — Progress Notes (Signed)
04/22/2013 1140 Nursing note Atrial EPW d/c per orders and per protocol. End intact. Resistance met with ventricular wires. Gershon Crane [facility] assisted with removal of ventricular wires. Vital signs collected per protocol. Pt. Tolerated well. Advised of bedrest for one hour post removal. Quaintance bell within reach. Will continue to monitor.  Larraine Argo, Blanchard Kelch

## 2013-04-23 MED ORDER — LEVALBUTEROL HCL 0.63 MG/3ML IN NEBU: 0.6300 mg | INHALATION_SOLUTION | Freq: Three times a day (TID) | RESPIRATORY_TRACT | Status: DC | PRN

## 2013-04-23 MED ORDER — LACTULOSE 10 GM/15ML PO SOLN
20.0000 g | Freq: Once | ORAL | Status: AC
  Administered 2013-04-23: 20 g via ORAL
  Filled 2013-04-23: qty 30

## 2013-04-23 MED ORDER — LEVALBUTEROL HCL 0.63 MG/3ML IN NEBU
0.6300 mg | INHALATION_SOLUTION | Freq: Once | RESPIRATORY_TRACT | Status: AC
  Administered 2013-04-23: 0.63 mg via RESPIRATORY_TRACT
  Filled 2013-04-23: qty 3

## 2013-04-23 NOTE — Evaluation (Signed)
Physical Therapy Evaluation Patient Details Name: Vernon Patterson MRN: [id number] DOB: March 23, 1956 Today's Date: 04/23/2013 Time: 6295-2841 PT Time Calculation (min): 21 min  PT Assessment / Plan / Recommendation History of Present Illness  s/p CABG  Clinical Impression  Pt presents to PT at a very high functional level. Performed standardized balance tests which pt performed very well on.  He is walking without difficulty without an assistive device. Recommend continued cardiac rehab, but does not need additional PT at this time. Pt in agreement.     PT Assessment  Patent does not need any further PT services    Follow Up Recommendations  No PT follow up    Does the patient have the potential to tolerate intense rehabilitation      Barriers to Discharge        Equipment Recommendations  None recommended by PT    Recommendations for Other Services Other (comment) (Continued Cardiac Rehab)   Frequency      Precautions / Restrictions Precautions Precautions: Sternal   Pertinent Vitals/Pain Pt denies pain now except when he coughs.        Mobility  Bed Mobility Bed Mobility: Supine to Sit;Sit to Supine Supine to Sit: 6: Modified independent (Device/Increase time) Sit to Supine: 6: Modified independent (Device/Increase time) Details for Bed Mobility Assistance: HOB was elevated some per pt request Transfers Transfers: Sit to Stand;Stand to Sit Sit to Stand: 7: Independent Stand to Sit: 7: Independent Ambulation/Gait Ambulation/Gait Assistance: 7: Independent Ambulation Distance (Feet): 300 Feet Assistive device: None Gait Pattern: Within Functional Limits Gait velocity: WNL    Exercises     PT Diagnosis:    PT Problem List:   PT Treatment Interventions:       PT Goals(Current goals can be found in the care plan section) Acute Rehab PT Goals Patient Stated Goal: To take a nap today  Visit Information  Last PT Received On: 04/23/13 Assistance Needed:  +1 History of Present Illness: s/p CABG       Prior Functioning  Home Living Family/patient expects to be discharged to:: Private residence Living Arrangements: Other relatives;Other (Comment) (brother) Available Help at Discharge: Family;Available PRN/intermittently Type of Home: House Home Access: Stairs to enter [employer] of Steps: couple per pt Home Layout: One level Home Equipment: None Prior Function Level of Independence: Independent Communication Communication: No difficulties    Cognition  Cognition Arousal/Alertness: Awake/alert Behavior During Therapy: WFL for tasks assessed/performed Overall Cognitive Status: Within Functional Limits for tasks assessed    Extremity/Trunk Assessment Upper Extremity Assessment Upper Extremity Assessment: Overall WFL for tasks assessed Lower Extremity Assessment Lower Extremity Assessment: Overall WFL for tasks assessed Cervical / Trunk Assessment Cervical / Trunk Assessment: Normal   Balance Standardized Balance Assessment Standardized Balance Assessment: Dynamic Gait Index Dynamic Gait Index Level Surface: Normal Change in Gait Speed: Normal Gait with Horizontal Head Turns: Mild Impairment Gait with Vertical Head Turns: Normal Gait and Pivot Turn: Normal Step Over Obstacle: Normal Step Around Obstacles: Normal Steps: Normal (Simulated) Total Score: 23 High Level Balance High Level Balance Activites: Backward walking High Level Balance Comments: no loss of balance with any activity  End of Session PT - End of Session Equipment Utilized During Treatment: Gait belt Activity Tolerance: Patient tolerated treatment well Patient left: in bed;with bed alarm set Nurse Communication: Mobility status  GP     Donnella Sham 04/23/2013, 3:37 PM [place]ona [place], PT  [phone] 04/23/2013

## 2013-04-23 NOTE — Progress Notes (Signed)
CARDIAC REHAB PHASE I   PRE:  Rate/Rhythm: 76 sr  BP:  Sitting: 98/58     SaO2: 93% ra  MODE:  Ambulation: 450 ft   POST:  Rate/Rhythm: 85 sr  BP:  Sitting: 110/58     SaO2: 96% ra  9:45am-10:20am Patient walked independently at a good pace.  Patient turned his head behind him to say something to his daughter and lost his balance.  I had my hand on him so I was able to stabilize him.  Patient did not complain of dizziness. Patient is interested in cardiac rehab if he doesn't have to pay for it.  [facility] had contacted him already with a letter.     Theresa Duty, [place] 04/23/2013 10:13 AM

## 2013-04-23 NOTE — Progress Notes (Addendum)
      [address]       [place] [address]             [phone]        5 Days Post-Op Procedure(s) (LRB): CORONARY ARTERY BYPASS GRAFTING (CABG) (N/A) INTRAOPERATIVE TRANSESOPHAGEAL ECHOCARDIOGRAM (N/A) ENDOVEIN HARVEST OF GREATER SAPHENOUS VEIN (Right)  Subjective: Patient without bowel movement. Has incisional pain this am.  Objective: Vital signs in last 24 hours: Temp:  [98.7 F (37.1 C)-99.4 F (37.4 C)] 98.7 F (37.1 C) (12/13 0408) Pulse Rate:  [71-82] 71 (12/13 0408) Cardiac Rhythm:  [-] Normal sinus rhythm (12/12 1953) Resp:  [17-18] 18 (12/13 0408) BP: (100-132)/(49-77) 107/63 mmHg (12/13 0408) SpO2:  [89 %-98 %] 93 % (12/13 0408) Weight:  [81.33 kg (179 lb 4.8 oz)] 81.33 kg (179 lb 4.8 oz) (12/13 0408)  Pre op weight 78 kg Current Weight  04/23/13 81.33 kg (179 lb 4.8 oz)      Intake/Output from previous day: 12/12 0701 - 12/13 0700 In: 720 [P.O.:720] Out: -    Physical Exam:  Cardiovascular: RRR, no murmurs, gallops, or rubs. Pulmonary: Expiratory wheezing Abdomen: Soft, non tender, bowel sounds present. Extremities: Mild bilateral lower extremity edema. Wounds: Clean and dry.  No erythema or signs of infection.  Lab Results: CBC: Recent Labs  04/21/13 0410  WBC 7.2  HGB 9.1*  HCT 26.1*  PLT 91*   BMET:  Recent Labs  04/21/13 0410  NA 133*  K 3.8  CL 102  CO2 24  GLUCOSE 101*  BUN 10  CREATININE 0.59  CALCIUM 7.9*    PT/INR:  Lab Results  Component Value Date   INR 1.48 04/18/2013   ABG:  INR: Will add last result for INR, ABG once components are confirmed Will add last 4 CBG results once components are confirmed  Assessment/Plan:  1. CV - S/p NSTEMI. Maintaining SR. On Lopressor 12.5 bid and Lisinopril 5 daily. 2.  Pulmonary - On room air. Encourage incentive spirometer. Xopenex PRN wheezing 3. Volume Overload - Continue with daily Lasix 4.  Acute blood loss anemia - Last H and H 9.1 and 26.1 5.  Is not able to go to SNF as has no insurance and is not eligible for Medicaid. Has made arrangements with friends and family. Will make sure medications are on "$4" plan as he has very little money. 6. LOC constipation 7.Possible discharge in am  ZIMMERMAN,DONIELLE MPA-C 04/23/2013,8:31 AM  Still some wheezing, as preop I have seen and examined Vernon Patterson and agree with the above assessment  and plan.  Delight Ovens MD Beeper [phone] Office [phone] 04/23/2013 11:34 AM

## 2013-04-24 MED ORDER — OXYCODONE HCL 5 MG PO TABS: 5.0000 mg | ORAL_TABLET | ORAL | Status: DC | PRN

## 2013-04-24 MED ORDER — ASPIRIN 325 MG PO TBEC: 325.0000 mg | DELAYED_RELEASE_TABLET | Freq: Every day | ORAL | Status: DC

## 2013-04-24 MED ORDER — METOPROLOL TARTRATE 25 MG PO TABS: 12.5000 mg | ORAL_TABLET | Freq: Two times a day (BID) | ORAL | Status: DC

## 2013-04-24 MED ORDER — POTASSIUM CHLORIDE CRYS ER 20 MEQ PO TBCR: 20.0000 meq | EXTENDED_RELEASE_TABLET | Freq: Every day | ORAL | Status: DC

## 2013-04-24 MED ORDER — FUROSEMIDE 40 MG PO TABS: 40.0000 mg | ORAL_TABLET | Freq: Every day | ORAL | Status: DC

## 2013-04-24 MED ORDER — PRAVASTATIN SODIUM 40 MG PO TABS: 40.0000 mg | ORAL_TABLET | Freq: Every day | ORAL | Status: DC

## 2013-04-24 MED ORDER — POLYETHYLENE GLYCOL 3350 17 G PO PACK
17.0000 g | PACK | Freq: Once | ORAL | Status: AC
  Administered 2013-04-24: 17 g via ORAL
  Filled 2013-04-24: qty 1

## 2013-04-24 MED ORDER — BENAZEPRIL HCL 10 MG PO TABS: 10.0000 mg | ORAL_TABLET | Freq: Every day | ORAL | Status: DC

## 2013-04-24 NOTE — Progress Notes (Signed)
   CARE MANAGEMENT NOTE 04/24/2013  Patient:  Vernon Patterson,Vernon Patterson   Account Number:  [id number]  Date Initiated:  04/19/2013  Documentation initiated by:  [name]  Subjective/Objective Assessment:   Post op emergent CABG x2     Action/Plan:   Anticipated DC Date:  04/25/2013   Anticipated DC Plan:  HOME W HOME HEALTH SERVICES      DC Planning Services  CM consult      Choice offered to / List presented to:             Status of service:  Completed, signed off Medicare Important Message given?   (If response is "NO", the following Medicare IM given date fields will be blank) Date Medicare IM given:   Date Additional Medicare IM given:    Discharge Disposition:  HOME/SELF CARE  Per UR Regulation:  Reviewed for med. necessity/level of care/duration of stay  If discussed at Long Length of Stay Meetings, dates discussed:    Comments:  04/24/13 11:20 CM met with pt in room and later again (13:15) with sister and pt in room to give and explain the parameters of the [facility] program with a list of participating pharmacies.  CM also gave pt and sister handout for the [facility] in [place] to pursue PCP, and possible Medicaid. Pt is going home with sister.   No other CM needs were communicated.  [name], BSN, CM [phone].  Contact:  [place] Daughter [phone]  04/22/13 [name],RN,BSN [phone] PT DOES NOT QUALIFY FOR SKILLED NURSING, PER CSW, AS HE HAS NO PAYOR SOURCE.  WILL NEED TO DC HOME WITH FAMILY MEMBERS. WILL LIKELY QUALIFY FOR MATCH LETTER AT DC; WILL ASK WEEKEND CM TO FOLLOW UP.  04-19-13 11am [name], RNBSN [phone] Daughter and sister in room with patient.  States he lives with brother.  Explained will need 24/7 caregivers on discharge for 7-10 days.  At first sister stated they would work that out, then asked what option would be if they could not.  Hope to work out 24/7 coverage with family but will need to follow to see if this was  worked out.

## 2013-04-24 NOTE — Progress Notes (Addendum)
      [address]       [place] [address]             [phone]        6 Days Post-Op Procedure(s) (LRB): CORONARY ARTERY BYPASS GRAFTING (CABG) (N/A) INTRAOPERATIVE TRANSESOPHAGEAL ECHOCARDIOGRAM (N/A) ENDOVEIN HARVEST OF GREATER SAPHENOUS VEIN (Right)  Subjective: Patient passing flatus but no bowel movement yet. He really wants to go home.  Objective: Vital signs in last 24 hours: Temp:  [98.1 F (36.7 C)-99.3 F (37.4 C)] 98.1 F (36.7 C) (12/14 0451) Pulse Rate:  [61-74] 61 (12/14 0451) Cardiac Rhythm:  [-] Normal sinus rhythm (12/13 1940) Resp:  [18-20] 18 (12/14 0451) BP: (102-113)/(60-68) 113/68 mmHg (12/14 0451) SpO2:  [90 %-96 %] 90 % (12/14 0451) Weight:  [80.377 kg (177 lb 3.2 oz)] 80.377 kg (177 lb 3.2 oz) (12/14 0451)  Pre op weight 78 kg Current Weight  04/24/13 80.377 kg (177 lb 3.2 oz)      Intake/Output from previous day: 12/13 0701 - 12/14 0700 In: 480 [P.O.:480] Out: 150 [Urine:150]   Physical Exam:  Cardiovascular: RRR, no murmurs, gallops, or rubs. Pulmonary: Clear to auscultation bilaterally Abdomen: Soft, non tender, bowel sounds present. Extremities: Trace bilateral lower extremity edema. Wounds: Clean and dry.  No erythema or signs of infection.  Lab Results: CBC:No results found for this basename: WBC, HGB, HCT, PLT,  in the last 72 hours BMET: No results found for this basename: NA, K, CL, CO2, GLUCOSE, BUN, CREATININE, CALCIUM,  in the last 72 hours  PT/INR:  Lab Results  Component Value Date   INR 1.48 04/18/2013   ABG:  INR: Will add last result for INR, ABG once components are confirmed Will add last 4 CBG results once components are confirmed  Assessment/Plan:  1. CV - S/p NSTEMI. Maintaining SR. On Lopressor 12.5 bid and Lisinopril 5 daily. 2.  Pulmonary - On room air. Encourage incentive spirometer. Xopenex PRN wheezing 3. Volume Overload - Continue with daily Lasix 4.  Acute blood loss anemia  - Last H and H 9.1 and 26.1 5. Is not able to go to SNF as has no insurance and is not eligible for Medicaid. Has made arrangements with friends and family. Will make sure medications are on "$4" plan as he has very little money. 6. LOC constipation 7.Hope to discharge later today  ZIMMERMAN,DONIELLE MPA-C 04/24/2013,8:02 AM    Plan d/c today if family comes to get him I have seen and examined Casimiro Needle Worst and agree with the above assessment  and plan.  Delight Ovens MD Beeper [phone] Office [phone] 04/24/2013 11:03 AM

## 2013-04-29 ENCOUNTER — Ambulatory Visit (INDEPENDENT_AMBULATORY_CARE_PROVIDER_SITE_OTHER): Payer: Self-pay

## 2013-04-29 DIAGNOSIS — Z4802 Encounter for removal of sutures: Secondary | ICD-10-CM

## 2013-04-29 DIAGNOSIS — I2510 Atherosclerotic heart disease of native coronary artery without angina pectoris: Secondary | ICD-10-CM

## 2013-04-29 NOTE — Progress Notes (Signed)
Removed 3 chest tube sutures from chest tube incision sites. No signs of infection and pt tolerated well.

## 2013-05-16 ENCOUNTER — Other Ambulatory Visit: Payer: Self-pay | Admitting: *Deleted

## 2013-05-16 DIAGNOSIS — I2510 Atherosclerotic heart disease of native coronary artery without angina pectoris: Secondary | ICD-10-CM

## 2013-05-18 ENCOUNTER — Ambulatory Visit (INDEPENDENT_AMBULATORY_CARE_PROVIDER_SITE_OTHER): Payer: Self-pay | Admitting: Surgery

## 2013-05-18 ENCOUNTER — Encounter: Payer: Self-pay | Admitting: Surgery

## 2013-05-18 ENCOUNTER — Ambulatory Visit (HOSPITAL_COMMUNITY)
Admission: RE | Admit: 2013-05-18 | Discharge: 2013-05-18 | Disposition: A | Discharge status: Home / Self Care | Payer: Self-pay | Source: Ambulatory Visit | Attending: Surgery | Admitting: Surgery

## 2013-05-18 VITALS — BP 113/78 | HR 60 | Resp 20 | Ht 69.0 in | Wt 177.0 lb

## 2013-05-18 DIAGNOSIS — I2510 Atherosclerotic heart disease of native coronary artery without angina pectoris: Secondary | ICD-10-CM

## 2013-05-18 DIAGNOSIS — Z951 Presence of aortocoronary bypass graft: Secondary | ICD-10-CM | POA: Insufficient documentation

## 2013-05-18 DIAGNOSIS — Z09 Encounter for follow-up examination after completed treatment for conditions other than malignant neoplasm: Secondary | ICD-10-CM | POA: Insufficient documentation

## 2013-05-18 NOTE — Progress Notes (Signed)
     HPI:  Patient returns for routine postoperative follow-up having undergone emergent coronary artery bypass graft surgery on 04/18/2013. The patient's early postoperative recovery while in the hospital was notable for an uncomplicated postop course. Since hospital discharge the patient reports that he has been feeling well. He is walking a lot with no chest pain or shortness of breath.   Current Outpatient Prescriptions  Medication Sig Dispense Refill  . aspirin EC 325 MG EC tablet Take 1 tablet (325 mg total) by mouth daily.  30 tablet  0  . benazepril (LOTENSIN) 10 MG tablet Take 1 tablet (10 mg total) by mouth daily.  30 tablet  1  . citalopram (CELEXA) 40 MG tablet Take 40 mg by mouth daily.      . metoprolol tartrate (LOPRESSOR) 25 MG tablet Take 0.5 tablets (12.5 mg total) by mouth 2 (two) times daily.  30 tablet  1  . pravastatin (PRAVACHOL) 40 MG tablet Take 1 tablet (40 mg total) by mouth daily.  30 tablet  1   No current facility-administered medications for this visit.    Physical Exam: BP 113/78  Pulse 60  Resp 20  Ht 5\' 9"  (1.753 m)  Wt 177 lb (80.287 kg)  BMI 26.13 kg/m2  SpO2 98% He looks well. Lung exam is clear. Cardiac exam shows a regular rate and rhythm with normal heart sounds. Chest incision is healing well and sternum is stable. The leg incisions are healing well and there is no peripheral edema.    Diagnostic Tests:  CLINICAL DATA:  Follow-up radiograph.  CABG.   EXAM: CHEST  2 VIEW   COMPARISON:  04/22/2013.   FINDINGS: Cardiopericardial silhouette is within normal limits for projection. Median sternotomy/ CABG. No airspace disease. Pleural effusions have resolved. Pleural reaction and scarring is present in the lingula, expected following median sternotomy/ CABG.   IMPRESSION: Normal chest radiograph following CABG.     Electronically Signed   By: Dereck Ligas M.D.   On: 05/18/2013 11:14     Impression:  Overall I think  he is doing well. I encouraged him to continue walking.  I told him he could drive his car but should not lift anything heavier than 10 lbs for three months postop. 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   Plan:  He will continue to follow up with Dr. Humphrey Rolls and Dr. Hall Busing and will contact me if he has any problems with his incisions.

## 2013-05-19 ENCOUNTER — Encounter: Payer: Self-pay | Admitting: *Deleted

## 2014-09-01 NOTE — Discharge Summary (Signed)
PATIENT NAME:  Vernon Patterson, Vernon Patterson MR#:  [id number] DATE OF BIRTH:  06/17/1955  DATE OF ADMISSION:  04/16/2013 DATE OF DISCHARGE:  04/18/2013  This is a transfer; transfer date 04/18/2013.   CONSULTING PHYSICIAN: Dr. Humphrey Rolls, cardiologist.  PROCEDURE: Cardiac cath.   TRANSFER DIAGNOSES: Non-ST elevation myocardial infarction, coronary artery disease, hypertension, hyperlipidemia, tobacco abuse, alcohol abuse.   REASON FOR ADMISSION: Chest pain on and off for 2 months, worsening 1 day.   HOSPITAL COURSE: The patient is a 59 year old Caucasian male with a history of hypertension, presented to the ED with substernal area chest pain, non-radiation, for 2 months and worsening for 1 day. The patient had troponin level; first troponin level was negative, but the second troponin level increased to 0.25. The patient was treated with aspirin and started on heparin drip.   PAST MEDICAL HISTORY: Hypertension, heart murmur.   PAST SURGICAL HISTORY: None.  FAMILY HISTORY: Mother died of a stroke 2 weeks ago. The patient's mother had diabetes, hypertension. Fathered died of pneumonia.   PERSONAL HISTORY: The patient smokes 3 cigars a day for many years. Drinks alcohol 4 to 5  cups of wine every day. He denies any drug abuse.   LABORATORY DATA ON ADMISSION DATE:  Showed CBC 6.2, hemoglobin 15.2, MCV 101, platelets 125. BUN 9, creatinine 0.74. INR 1.0. EKG showed normal sinus rhythm at 71 bpm. Troponin, as mentioned above, the first one was 0.03, the second one was 0.25.   1.  Non-ST elevation myocardial infarction. After admission, the patient had been treated with heparin drip, aspirin, statin, Lopressor, lisinopril. Dr. Humphrey Rolls evaluated the patient, did a cardiac cath today, which showed critical distal left main 90%, LCx mid 50% OM1 proximal 50%, RCA proximal 70%, and then normal ejection fraction. Dr. Humphrey Rolls discussed with Dr. Servando Snare, thoracic vascular surgeon in [facility], with plans to transfer the patient  there to get CABG. Also, the patient needs to follow up with Dr. Humphrey Rolls 10 days after surgery. I discussed with the patient and the patient's family member about the patient's condition and the transfer plan. He agreed to be transferred there. I counselled the patient for tobacco abuse and alcohol abuse cessation again.  The patient will be transferred to [facility] with heparin drip. I discussed the patient's transfer plan with patient family,  the case manager and the nurse.   TIME SPENT: About 45 minutes.   ____________________________ Demetrios Loll, MD qc:dmm D: 04/18/2013 12:08:59 ET T: 04/18/2013 12:45:47 ET JOB#: [id number]  cc: Demetrios Loll, MD, <Dictator> Demetrios Loll MD ELECTRONICALLY SIGNED 04/18/2013 17:11

## 2014-09-01 NOTE — H&P (Signed)
PATIENT NAME:  Vernon Patterson, Vernon Patterson MR#:  [id number] DATE OF BIRTH:  May 18, 1955  DATE OF ADMISSION:  04/16/2013  PRIMARY CARE PHYSICIAN: Sharlet Salina C. Hall Busing, MD  REFERRING PHYSICIAN: Yetta Numbers. Karma Greaser, MD  CHIEF COMPLAINT: Chest pain on and off for 2 months, worsening today   HISTORY OF PRESENT ILLNESS: The patient is a 59 year old Caucasian male with a history of hypertension, who presented to the ED with chest pain in the substernal area, nonradiating. He developed chest pain occasionally. It is hard to describe whether it is sharp or burning, but he feels uncomfortable. The chest pain has been worsening today, so he came here for further evaluation. The patient's first troponin level was negative, but the second one increased to 0.25. The patient was treated with aspirin and will start heparin drip. The patient denies any other symptoms.   PAST MEDICAL HISTORY: Hypertension, heart murmur.   PAST SURGICAL HISTORY: None.  FAMILY HISTORY: Mother died of a stroke 2 weeks ago. She also had diabetes, hypertension. Father died of pneumonia.   HOME MEDICATIONS: The patient take 2 kinds of hypertensive medications, but the patient cannot remember.   ALLERGIES: None.  SOCIAL HISTORY: The patient smokes 3 cigars a day for many years. Drinks alcohol 4 to 5 cups of wine every day. Denies any drug abuse.  REVIEW OF SYSTEMS:  CONSTITUTIONAL: The patient denies any fever or chills. No headache or dizziness. No weakness.  EYES: No double vision or blurred vision.  ENT: No postnasal drip, slurred speech or dysphagia.  CARDIOVASCULAR: Positive for chest pain, but no palpitations, orthopnea or nocturnal dyspnea. No leg edema.  PULMONARY: No cough, sputum, shortness of breath or hemoptysis.  GASTROINTESTINAL: No abdominal pain, nausea, vomiting or diarrhea. No melena or bloody stool.  GENITOURINARY: No dysuria, hematuria or incontinence.  SKIN: No rash or jaundice.  HEMATOLOGY: No easy bruising or bleeding.   ENDOCRINOLOGY: No polyuria, polydipsia, heat or cold intolerance.  NEUROLOGY: No syncope, loss of consciousness or seizure.   PHYSICAL EXAMINATION:  VITAL SIGNS: Temperature 97.7, blood pressure 144/91, pulse 77, respirations 18, O2 saturation 97% on room air.  GENERAL: The patient is alert, awake, oriented, in no acute distress.  HEENT: Pupils round, equally reactive to light and accommodation. Moist oral mucosa. Clear oropharynx.  NECK: Supple. No JVD or carotid bruit. No lymphadenopathy. No thyromegaly.  CARDIOVASCULAR: S1, S2, regular rate and rhythm, systolic murmur 3/6. No gallop.  PULMONARY: Bilateral air entry. No wheezing or rales. No use of accessory muscles to breathe.  ABDOMEN: Soft. No distention. No tenderness. No organomegaly. Bowel sounds present.  EXTREMITIES: No edema, clubbing or cyanosis. No calf tenderness. Strong bilateral pedal pulses.  SKIN: No rash or jaundice.  NEUROLOGY: A and O x3. No focal deficit. Power 5/5. Sensation intact.   LABORATORY DATA: Troponin was 0.03 and increased to 0.25. Chest x-ray: Unremarkable. CBC: WBC 6.2, hemoglobin 15.2, platelets 125. MCV 101. Electrolytes: Normal. BUN 9, creatinine 0.74. INR 1.0, PTT 28.8. EKG showed normal sinus rhythm at 71 BPM.   IMPRESSION:  1. Non-ST-elevation myocardial infarction.  2. Hypertension.  3. Tobacco abuse.  4. Alcohol abuse.   PLAN OF TREATMENT:  1. The patient will be admitted to telemetry floor. Will continue telemonitor, O2 by nasal cannula. Follow up troponin level. Continue heparin drip and aspirin. We will start a statin, Lopressor, lisinopril, and get a cardiology consult.  2. We will check lipid panel, TSH, A1c.  3. For tobacco abuse, smoking cessation was counseled for 5 minutes.  4. We will start CIWA protocol for alcohol abuse and also counseled for alcohol abuse cessation.   CODE STATUS: The patient wants full code.   I discussed the patient's condition and plan of treatment with the  patient.   TIME SPENT: About 55 minutes.   ____________________________ Demetrios Loll, MD qc:lb D: 04/16/2013 08:12:54 ET T: 04/16/2013 08:28:48 ET JOB#: [id number]  cc: Demetrios Loll, MD, <Dictator> Demetrios Loll MD ELECTRONICALLY SIGNED 04/16/2013 15:38

## 2014-09-01 NOTE — Consult Note (Signed)
PATIENT NAME:  Vernon Patterson, Vernon Patterson MR#:  [id number] DATE OF BIRTH:  06-21-1955  CARDIOLOGY CONSULTATION   DATE OF CONSULTATION:  04/16/2013  CONSULTING PHYSICIAN:  Dionisio David, MD  INDICATION FOR CONSULTATION: Chest pain.   HISTORY OF PRESENT ILLNESS: This is a 59 year old white male with a past medical history of hypertension, who presented to the Emergency Room with severe chest pain, substernal, nonradiating, associated with shortness of breath. His troponin was elevated. Thus, I was asked to evaluate the patient. Chest pain has been going on for the past several weeks, according to the patient, but now was unbearable. Thus, he came to the Emergency Room. Right now, he is not having chest pain.   PAST MEDICAL HISTORY: History of hypertension, heart murmur.  FAMILY HISTORY: Positive for coronary artery disease and CVA.   ALLERGIES: None.   SOCIAL HISTORY: He smokes 3 cigars a day. Drinks about 4 to 5 cups of wine every day.   PHYSICAL EXAMINATION:  GENERAL: He is alert, oriented x3, in no acute distress.  VITAL SIGNS: Stable.  NECK: Revealed no JVD.  LUNGS: Clear.  HEART: Regular rate and rhythm. Normal S1, S2. No audible murmur.  ABDOMEN: Soft, nontender, positive bowel sounds.  EXTREMITIES: No pedal edema.  NEUROLOGIC: The patient appears to be intact.   DIAGNOSTIC STUDIES: His EKG shows normal sinus rhythm, nonspecific ST-T changes. His initial troponin was 0.03, and later on it increased to 0.25 His BUN is 9, creatinine is 0.74. The third set of troponin is even higher at 0.29.   ASSESSMENT AND PLAN: The patient is having a non-ST-elevation myocardial infarction, and plan is to do left heart catheterization. Agree with giving Lipitor, clonidine to control his blood pressure, clopidogrel, lisinopril. Will add nitrates if he continues to have chest pain. In the meantime, will set him up for cardiac catheterization.   Thank you very much for referral.    ____________________________ Dionisio David, MD sak:lb D: 04/16/2013 13:10:56 ET T: 04/16/2013 13:29:31 ET JOB#: [id number]  cc: Dionisio David, MD, <Dictator> Dionisio David MD ELECTRONICALLY SIGNED 04/18/2013 8:34

## 2014-09-07 ENCOUNTER — Emergency Department
Admit: 2014-09-07 | Disposition: A | Discharge status: Home / Self Care | Payer: Self-pay | Admitting: Emergency Medicine

## 2014-09-25 ENCOUNTER — Emergency Department
Admission: EM | Admit: 2014-09-25 | Discharge: 2014-09-25 | Disposition: A | Discharge status: Home / Self Care | Payer: Managed Care, Other (non HMO) | Attending: Emergency Medicine | Admitting: Emergency Medicine

## 2014-09-25 ENCOUNTER — Encounter: Payer: Self-pay | Admitting: Emergency Medicine

## 2014-09-25 DIAGNOSIS — Z4802 Encounter for removal of sutures: Secondary | ICD-10-CM

## 2014-09-25 DIAGNOSIS — Z7982 Long term (current) use of aspirin: Secondary | ICD-10-CM | POA: Insufficient documentation

## 2014-09-25 DIAGNOSIS — I10 Essential (primary) hypertension: Secondary | ICD-10-CM | POA: Insufficient documentation

## 2014-09-25 DIAGNOSIS — Z79899 Other long term (current) drug therapy: Secondary | ICD-10-CM | POA: Diagnosis not present

## 2014-09-25 DIAGNOSIS — Z720 Tobacco use: Secondary | ICD-10-CM | POA: Diagnosis not present

## 2014-09-25 NOTE — ED Notes (Signed)
Pt to ed with sutures in left hand third digit x 2 weeks, wants removed

## 2014-09-25 NOTE — Discharge Instructions (Signed)

## 2014-09-25 NOTE — ED Provider Notes (Signed)
[facility] Emergency Department Provider Note  ____________________________________________  Time seen: 1150  I have reviewed the triage vital signs and the nursing notes.   HISTORY  Chief Complaint Suture / Staple Removal   HPI Vernon Patterson is a 59 y.o. male is here for suture removal. He is not having any problems at this time and states the area looks much better than it did. He denies any pain.Sutures of been there for 2 weeks.     Past Medical History  Diagnosis Date  . Coronary artery disease   . Hypertension   . Anxiety   . Depression   . Heart murmur     Patient Active Problem List   Diagnosis Date Noted  . CAD (coronary artery disease) 04/18/2013    Past Surgical History  Procedure Laterality Date  . Coronary artery bypass graft    . Cardiac catheterization    . Coronary artery bypass graft N/A 04/18/2013    Procedure: CORONARY ARTERY BYPASS GRAFTING (CABG);  Surgeon: Gaye Pollack, MD;  Location: [place];  Service: Open Heart Surgery;  Laterality: N/A;  Times  3  using left internal mammary artery and endoscopically harvested right saphenous vein  . Intraoperative transesophageal echocardiogram N/A 04/18/2013    Procedure: INTRAOPERATIVE TRANSESOPHAGEAL ECHOCARDIOGRAM;  Surgeon: Gaye Pollack, MD;  Location: [facility] OR;  Service: Open Heart Surgery;  Laterality: N/A;  . Endovein harvest of greater saphenous vein Right 04/18/2013    Procedure: ENDOVEIN HARVEST OF GREATER SAPHENOUS VEIN;  Surgeon: Gaye Pollack, MD;  Location: [place];  Service: Open Heart Surgery;  Laterality: Right;  Some of the saphenous vein from the lower leg also used.    Current Outpatient Rx  Name  Route  Sig  Dispense  Refill  . aspirin EC 325 MG EC tablet   Oral   Take 1 tablet (325 mg total) by mouth daily.   30 tablet   0   . benazepril (LOTENSIN) 10 MG tablet   Oral   Take 1 tablet (10 mg total) by mouth daily.   30 tablet   1   . citalopram (CELEXA)  40 MG tablet   Oral   Take 40 mg by mouth daily.         . metoprolol tartrate (LOPRESSOR) 25 MG tablet   Oral   Take 0.5 tablets (12.5 mg total) by mouth 2 (two) times daily.   30 tablet   1   . pravastatin (PRAVACHOL) 40 MG tablet   Oral   Take 1 tablet (40 mg total) by mouth daily.   30 tablet   1     Allergies Review of patient's allergies indicates no known allergies.  No family history on file.  Social History History  Substance Use Topics  . Smoking status: Current Every Day Smoker    Types: Cigars  . Smokeless tobacco: Not on file     Comment: 2-3 cigars/day  . Alcohol Use: 0.0 oz/week     Comment: 5 bottles of wine/week    Review of Systems  Denies any fever or chills, nausea or vomiting  ____________________________________________   PHYSICAL EXAM:  VITAL SIGNS: ED Triage Vitals  Enc Vitals Group     BP 09/25/14 1139 179/91 mmHg     Pulse Rate 09/25/14 1138 60     Resp 09/25/14 1139 18     Temp 09/25/14 1138 98.2 F (36.8 C)     Temp src --  SpO2 09/25/14 1139 100 %     Weight 09/25/14 1139 170 lb (77.111 kg)     Height 09/25/14 1139 5\' 8"  (1.727 m)     Head Cir --      Peak Flow --      Pain Score 09/25/14 1139 0     Pain Loc --      Pain Edu? --      Excl. in [place]? --     Left third digit volar aspect at the base of the digit has a well-healed wound without evidence of infection.  ____________________________________________  __________________________________________   PROCEDURES  Procedure(s) performed: Sutures were removed by RN without any complications.  ____________________________________________   INITIAL IMPRESSION / ASSESSMENT AND PLAN / ED COURSE  Pertinent labs & imaging results that were available during my care of the patient were reviewed by me and considered in my medical decision making (see chart for details).  Patient was told to return to the emergency room if any continued problems or urgent  concerns.  ____________________________________________   FINAL CLINICAL IMPRESSION(S) / ED DIAGNOSES  Final diagnoses:  Encounter for removal of sutures      Johnn Hai, PA-C 09/25/14 [place], MD 09/26/14 [phone]

## 2014-10-12 IMAGING — CR DG CHEST 2V
2 series · 2 of 2 positions shown · non-contrast
Comparison: 04/21/2013, 04/20/2013, and 04/16/2013

CLINICAL DATA: Atelectasis.  Status post recent CABG.

EXAM:
CHEST  2 VIEW

[w chest pa]
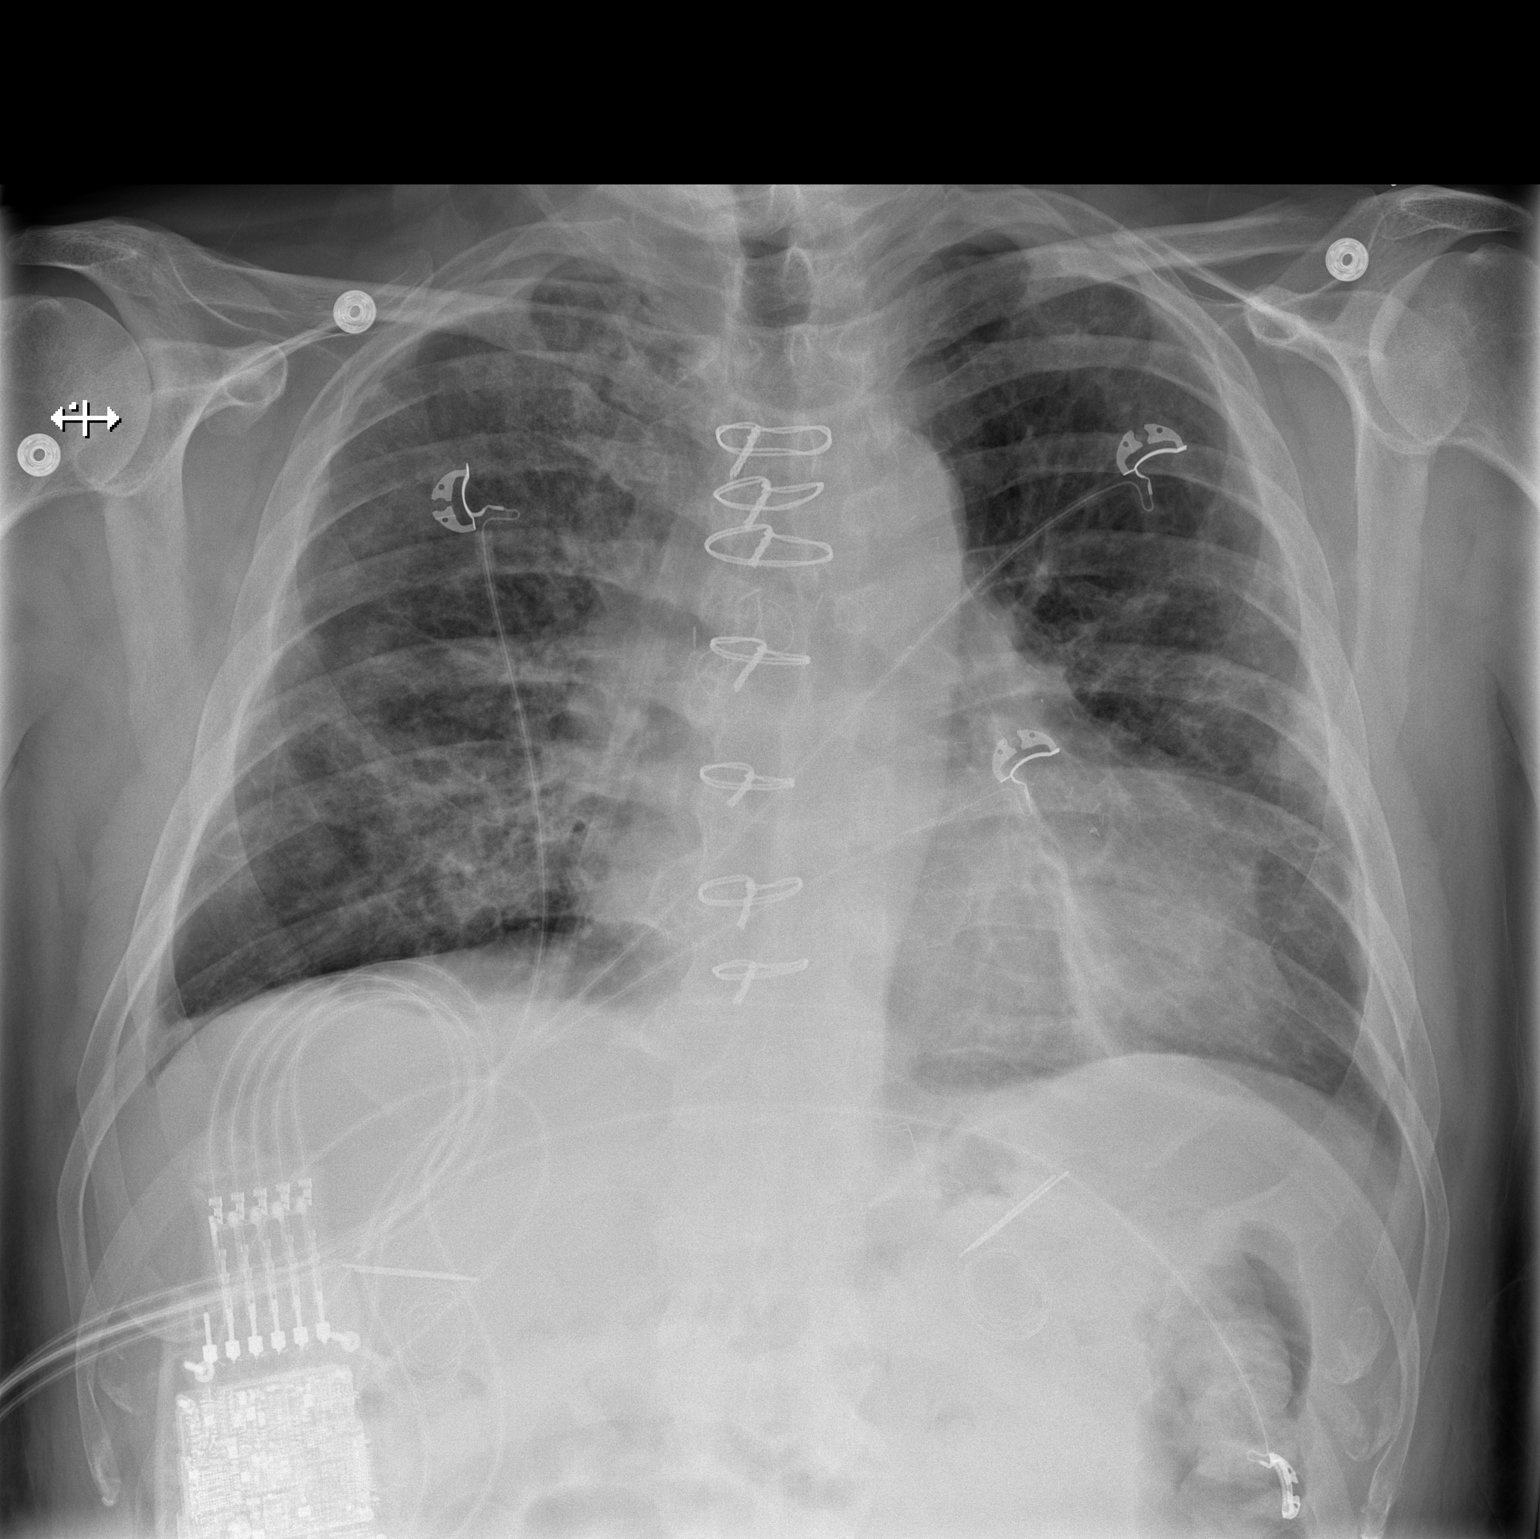

[w chest lat]
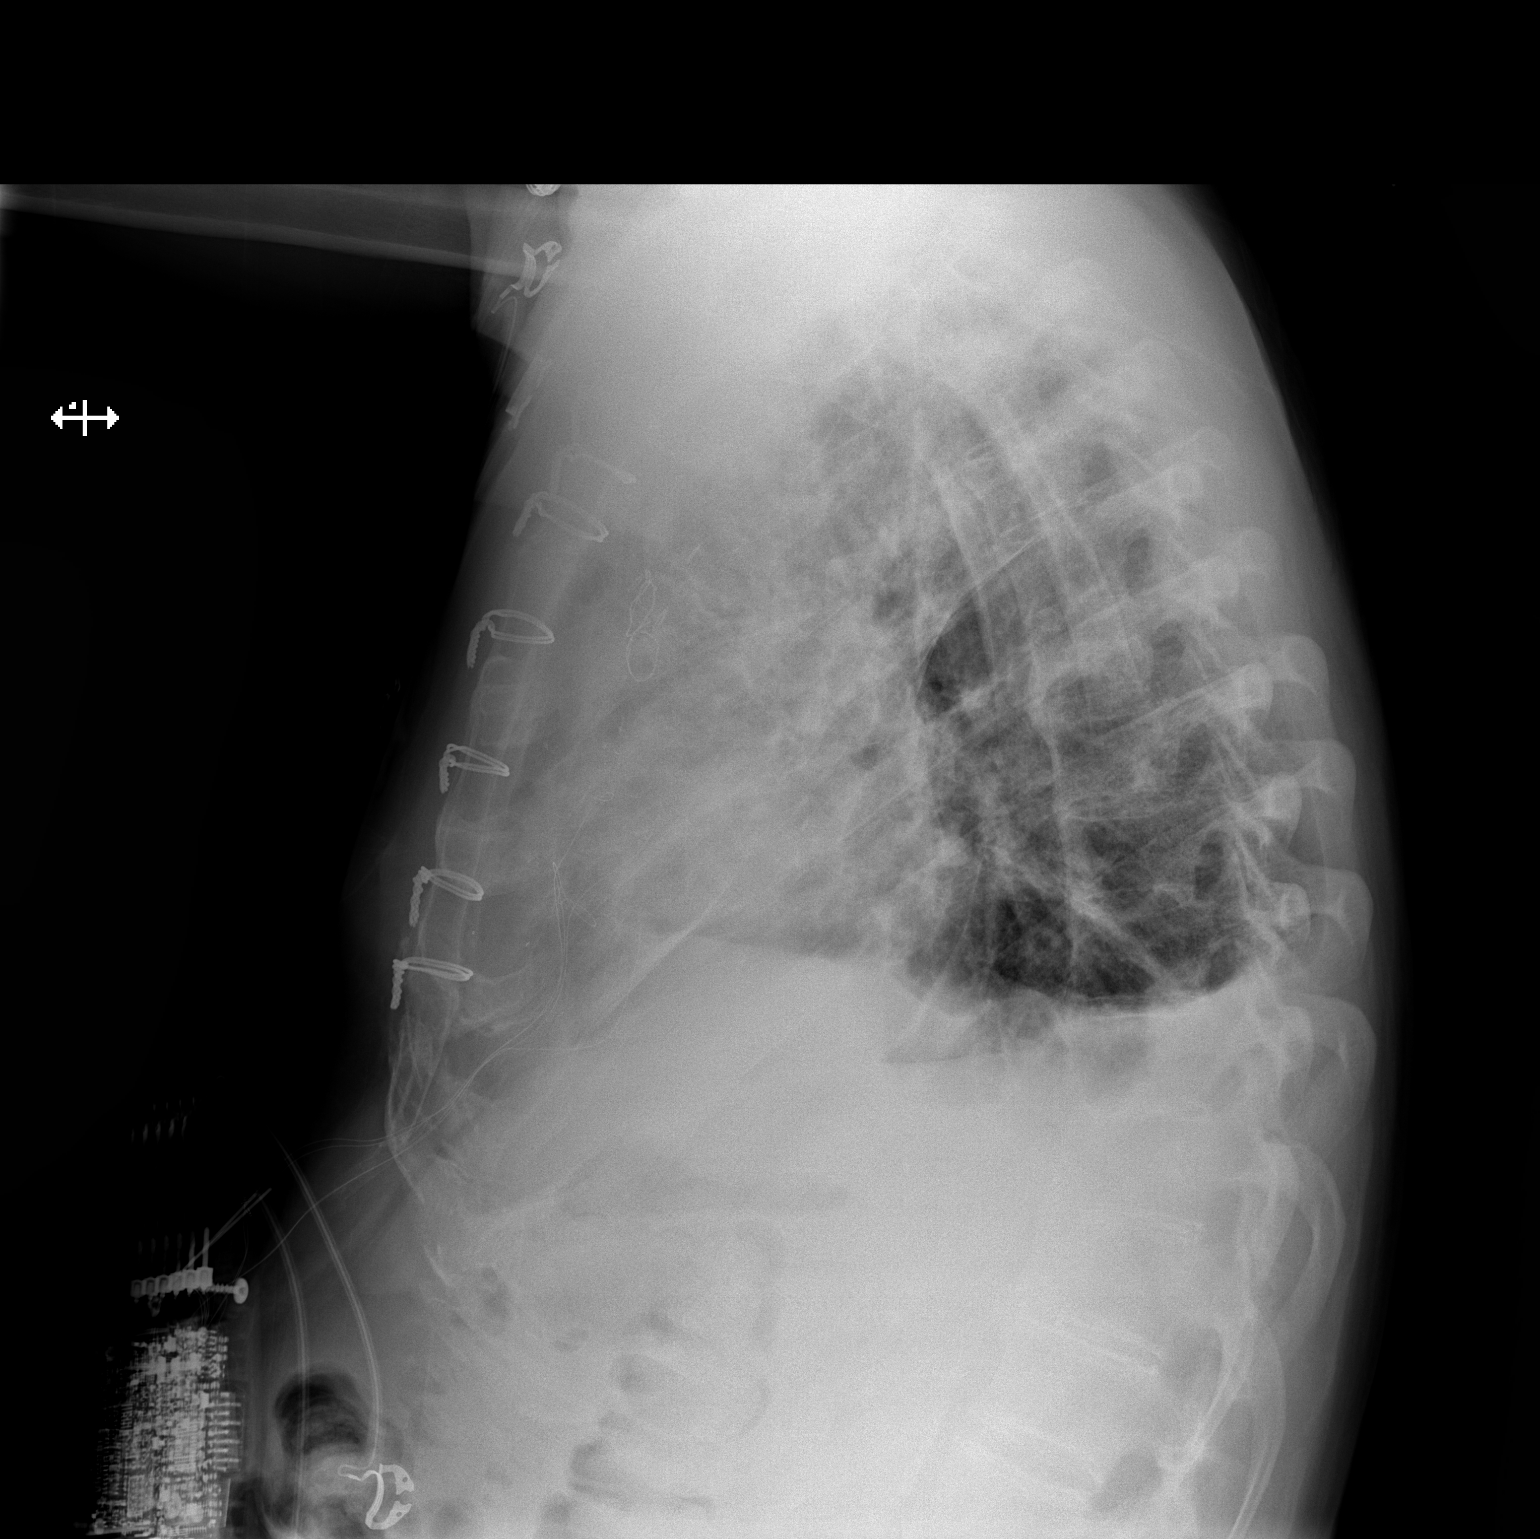

[2 of 2 positions shown; findings below may reference images not displayed]

FINDINGS: Left chest tube has been removed.  No pneumothorax.

Bilateral atelectasis has almost completely resolved. Slight patchy
pulmonary edema on the right has almost cleared. Small bilateral
pleural effusions.
IMPRESSION: Improved pulmonary edema and bibasilar atelectasis. Small bilateral
effusions.

## 2015-04-07 DIAGNOSIS — Z7982 Long term (current) use of aspirin: Secondary | ICD-10-CM | POA: Insufficient documentation

## 2015-04-07 DIAGNOSIS — Y30XXXA Falling, jumping or pushed from a high place, undetermined intent, initial encounter: Secondary | ICD-10-CM | POA: Diagnosis not present

## 2015-04-07 DIAGNOSIS — Y9289 Other specified places as the place of occurrence of the external cause: Secondary | ICD-10-CM | POA: Diagnosis not present

## 2015-04-07 DIAGNOSIS — Y9389 Activity, other specified: Secondary | ICD-10-CM | POA: Insufficient documentation

## 2015-04-07 DIAGNOSIS — S329XXA Fracture of unspecified parts of lumbosacral spine and pelvis, initial encounter for closed fracture: Secondary | ICD-10-CM | POA: Diagnosis not present

## 2015-04-07 DIAGNOSIS — I10 Essential (primary) hypertension: Secondary | ICD-10-CM | POA: Diagnosis not present

## 2015-04-07 DIAGNOSIS — Z79899 Other long term (current) drug therapy: Secondary | ICD-10-CM | POA: Insufficient documentation

## 2015-04-07 DIAGNOSIS — Y998 Other external cause status: Secondary | ICD-10-CM | POA: Diagnosis not present

## 2015-04-07 DIAGNOSIS — S79912A Unspecified injury of left hip, initial encounter: Secondary | ICD-10-CM | POA: Diagnosis present

## 2015-04-08 ENCOUNTER — Emergency Department
Admission: EM | Admit: 2015-04-08 | Discharge: 2015-04-08 | Disposition: A | Discharge status: Home / Self Care | Payer: Managed Care, Other (non HMO) | Attending: Emergency Medicine | Admitting: Emergency Medicine

## 2015-04-08 ENCOUNTER — Emergency Department: Payer: Managed Care, Other (non HMO)

## 2015-04-08 ENCOUNTER — Encounter: Payer: Self-pay | Admitting: Emergency Medicine

## 2015-04-08 DIAGNOSIS — S329XXA Fracture of unspecified parts of lumbosacral spine and pelvis, initial encounter for closed fracture: Secondary | ICD-10-CM

## 2015-04-08 MED ORDER — OXYCODONE-ACETAMINOPHEN 5-325 MG PO TABS
1.0000 | ORAL_TABLET | Freq: Once | ORAL | Status: AC
  Administered 2015-04-08: 1 via ORAL
  Filled 2015-04-08: qty 1

## 2015-04-08 MED ORDER — OXYCODONE-ACETAMINOPHEN 5-325 MG PO TABS: 1.0000 | ORAL_TABLET | ORAL | Status: DC | PRN
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
## 2015-04-08 NOTE — ED Notes (Signed)
Patient with no complaints at this time. Respirations even and unlabored. Skin warm/dry. Discharge instructions reviewed with patient at this time. Patient given opportunity to voice concerns/ask questions. Patient discharged at this time and left Emergency Department, via wheelchair.   

## 2019-10-11 ENCOUNTER — Other Ambulatory Visit: Payer: Self-pay | Admitting: Internal Medicine

## 2019-10-11 DIAGNOSIS — Z789 Other specified health status: Secondary | ICD-10-CM

## 2019-10-11 DIAGNOSIS — R7989 Other specified abnormal findings of blood chemistry: Secondary | ICD-10-CM

## 2019-10-11 DIAGNOSIS — R634 Abnormal weight loss: Secondary | ICD-10-CM

## 2019-10-11 DIAGNOSIS — Z87891 Personal history of nicotine dependence: Secondary | ICD-10-CM

## 2019-10-11 DIAGNOSIS — F1090 Alcohol use, unspecified, uncomplicated: Secondary | ICD-10-CM

## 2019-10-11 DIAGNOSIS — R109 Unspecified abdominal pain: Secondary | ICD-10-CM

## 2019-10-11 DIAGNOSIS — R059 Cough, unspecified: Secondary | ICD-10-CM

## 2019-10-19 ENCOUNTER — Other Ambulatory Visit: Payer: Self-pay

## 2019-10-19 ENCOUNTER — Ambulatory Visit
Admission: RE | Admit: 2019-10-19 | Discharge: 2019-10-19 | Disposition: A | Discharge status: Home / Self Care | Payer: Medicare Other | Source: Ambulatory Visit | Attending: Internal Medicine | Admitting: Internal Medicine

## 2019-10-19 DIAGNOSIS — Z789 Other specified health status: Secondary | ICD-10-CM

## 2019-10-19 DIAGNOSIS — R05 Cough: Secondary | ICD-10-CM | POA: Diagnosis present

## 2019-10-19 DIAGNOSIS — Z87891 Personal history of nicotine dependence: Secondary | ICD-10-CM

## 2019-10-19 DIAGNOSIS — Z7289 Other problems related to lifestyle: Secondary | ICD-10-CM | POA: Insufficient documentation

## 2019-10-19 DIAGNOSIS — R7989 Other specified abnormal findings of blood chemistry: Secondary | ICD-10-CM

## 2019-10-19 DIAGNOSIS — F1090 Alcohol use, unspecified, uncomplicated: Secondary | ICD-10-CM

## 2019-10-19 DIAGNOSIS — R945 Abnormal results of liver function studies: Secondary | ICD-10-CM | POA: Insufficient documentation

## 2019-10-19 DIAGNOSIS — R109 Unspecified abdominal pain: Secondary | ICD-10-CM

## 2019-10-19 DIAGNOSIS — R634 Abnormal weight loss: Secondary | ICD-10-CM

## 2019-10-19 DIAGNOSIS — R059 Cough, unspecified: Secondary | ICD-10-CM

## 2019-11-23 ENCOUNTER — Encounter: Payer: Self-pay | Admitting: Emergency Medicine

## 2019-11-23 ENCOUNTER — Inpatient Hospital Stay
Admission: EM | Admit: 2019-11-23 | Discharge: 2019-11-28 | DRG: 374 | Disposition: A | Discharge status: Home / Self Care | Payer: Medicare Other | Attending: Internal Medicine | Admitting: Internal Medicine

## 2019-11-23 ENCOUNTER — Other Ambulatory Visit: Payer: Self-pay

## 2019-11-23 DIAGNOSIS — D62 Acute posthemorrhagic anemia: Secondary | ICD-10-CM | POA: Diagnosis present

## 2019-11-23 DIAGNOSIS — R627 Adult failure to thrive: Secondary | ICD-10-CM | POA: Diagnosis present

## 2019-11-23 DIAGNOSIS — E43 Unspecified severe protein-calorie malnutrition: Secondary | ICD-10-CM | POA: Diagnosis present

## 2019-11-23 DIAGNOSIS — K920 Hematemesis: Secondary | ICD-10-CM | POA: Diagnosis present

## 2019-11-23 DIAGNOSIS — C160 Malignant neoplasm of cardia: Principal | ICD-10-CM | POA: Diagnosis present

## 2019-11-23 DIAGNOSIS — K922 Gastrointestinal hemorrhage, unspecified: Secondary | ICD-10-CM | POA: Diagnosis present

## 2019-11-23 DIAGNOSIS — I493 Ventricular premature depolarization: Secondary | ICD-10-CM | POA: Diagnosis present

## 2019-11-23 DIAGNOSIS — F17290 Nicotine dependence, other tobacco product, uncomplicated: Secondary | ICD-10-CM | POA: Diagnosis present

## 2019-11-23 DIAGNOSIS — I25118 Atherosclerotic heart disease of native coronary artery with other forms of angina pectoris: Secondary | ICD-10-CM | POA: Diagnosis not present

## 2019-11-23 DIAGNOSIS — E785 Hyperlipidemia, unspecified: Secondary | ICD-10-CM | POA: Diagnosis present

## 2019-11-23 DIAGNOSIS — K222 Esophageal obstruction: Secondary | ICD-10-CM | POA: Diagnosis present

## 2019-11-23 DIAGNOSIS — R059 Cough, unspecified: Secondary | ICD-10-CM

## 2019-11-23 DIAGNOSIS — I10 Essential (primary) hypertension: Secondary | ICD-10-CM | POA: Diagnosis present

## 2019-11-23 DIAGNOSIS — I2510 Atherosclerotic heart disease of native coronary artery without angina pectoris: Secondary | ICD-10-CM | POA: Diagnosis not present

## 2019-11-23 DIAGNOSIS — I350 Nonrheumatic aortic (valve) stenosis: Secondary | ICD-10-CM | POA: Diagnosis not present

## 2019-11-23 DIAGNOSIS — Z833 Family history of diabetes mellitus: Secondary | ICD-10-CM

## 2019-11-23 DIAGNOSIS — D689 Coagulation defect, unspecified: Secondary | ICD-10-CM | POA: Diagnosis present

## 2019-11-23 DIAGNOSIS — Z7982 Long term (current) use of aspirin: Secondary | ICD-10-CM

## 2019-11-23 DIAGNOSIS — Z20822 Contact with and (suspected) exposure to covid-19: Secondary | ICD-10-CM | POA: Diagnosis present

## 2019-11-23 DIAGNOSIS — Z951 Presence of aortocoronary bypass graft: Secondary | ICD-10-CM | POA: Diagnosis not present

## 2019-11-23 DIAGNOSIS — I352 Nonrheumatic aortic (valve) stenosis with insufficiency: Secondary | ICD-10-CM | POA: Diagnosis present

## 2019-11-23 DIAGNOSIS — I340 Nonrheumatic mitral (valve) insufficiency: Secondary | ICD-10-CM | POA: Diagnosis not present

## 2019-11-23 DIAGNOSIS — I351 Nonrheumatic aortic (valve) insufficiency: Secondary | ICD-10-CM | POA: Diagnosis not present

## 2019-11-23 DIAGNOSIS — D490 Neoplasm of unspecified behavior of digestive system: Secondary | ICD-10-CM

## 2019-11-23 DIAGNOSIS — F329 Major depressive disorder, single episode, unspecified: Secondary | ICD-10-CM | POA: Diagnosis present

## 2019-11-23 DIAGNOSIS — E876 Hypokalemia: Secondary | ICD-10-CM

## 2019-11-23 DIAGNOSIS — Z8249 Family history of ischemic heart disease and other diseases of the circulatory system: Secondary | ICD-10-CM | POA: Diagnosis not present

## 2019-11-23 DIAGNOSIS — F419 Anxiety disorder, unspecified: Secondary | ICD-10-CM | POA: Diagnosis present

## 2019-11-23 DIAGNOSIS — I248 Other forms of acute ischemic heart disease: Secondary | ICD-10-CM | POA: Diagnosis present

## 2019-11-23 DIAGNOSIS — R05 Cough: Secondary | ICD-10-CM | POA: Diagnosis not present

## 2019-11-23 DIAGNOSIS — R630 Anorexia: Secondary | ICD-10-CM | POA: Diagnosis not present

## 2019-11-23 DIAGNOSIS — J392 Other diseases of pharynx: Secondary | ICD-10-CM | POA: Diagnosis present

## 2019-11-23 DIAGNOSIS — Z01810 Encounter for preprocedural cardiovascular examination: Secondary | ICD-10-CM | POA: Diagnosis not present

## 2019-11-23 DIAGNOSIS — D638 Anemia in other chronic diseases classified elsewhere: Secondary | ICD-10-CM | POA: Diagnosis present

## 2019-11-23 DIAGNOSIS — K921 Melena: Secondary | ICD-10-CM | POA: Diagnosis present

## 2019-11-23 DIAGNOSIS — D500 Iron deficiency anemia secondary to blood loss (chronic): Secondary | ICD-10-CM | POA: Diagnosis not present

## 2019-11-23 DIAGNOSIS — R011 Cardiac murmur, unspecified: Secondary | ICD-10-CM | POA: Diagnosis not present

## 2019-11-23 DIAGNOSIS — Z6821 Body mass index (BMI) 21.0-21.9, adult: Secondary | ICD-10-CM

## 2019-11-23 DIAGNOSIS — Z720 Tobacco use: Secondary | ICD-10-CM | POA: Diagnosis not present

## 2019-11-23 DIAGNOSIS — R1310 Dysphagia, unspecified: Secondary | ICD-10-CM | POA: Diagnosis not present

## 2019-11-23 DIAGNOSIS — F1010 Alcohol abuse, uncomplicated: Secondary | ICD-10-CM | POA: Diagnosis present

## 2019-11-23 DIAGNOSIS — D649 Anemia, unspecified: Secondary | ICD-10-CM | POA: Diagnosis not present

## 2019-11-23 LAB — BASIC METABOLIC PANEL
Anion gap: 12 (ref 5–15)
BUN: 15 mg/dL (ref 8–23)
CO2: 22 mmol/L (ref 22–32)
Calcium: 9 mg/dL (ref 8.9–10.3)
Chloride: 107 mmol/L (ref 98–111)
Creatinine, Ser: 0.81 mg/dL (ref 0.61–1.24)
GFR calc Af Amer: >60 mL/min (ref >60)
GFR calc non Af Amer: >60 mL/min (ref >60)
Glucose, Bld: 110 mg/dL — ABNORMAL HIGH (ref 70–99)
Potassium: 3.5 mmol/L (ref 3.5–5.1)
Sodium: 141 mmol/L (ref 135–145)

## 2019-11-23 LAB — SARS CORONAVIRUS 2 BY RT PCR (HOSPITAL ORDER, PERFORMED IN ~~LOC~~ HOSPITAL LAB): SARS Coronavirus 2: NEGATIVE

## 2019-11-23 LAB — CBC
HCT: 22 % — ABNORMAL LOW (ref 39.0–52.0)
Hemoglobin: 7.4 g/dL — ABNORMAL LOW (ref 13.0–17.0)
MCH: 33.5 pg (ref 26.0–34.0)
MCHC: 33.6 g/dL (ref 30.0–36.0)
MCV: 99.5 fL (ref 80.0–100.0)
Platelets: 213 10*3/uL (ref 150–400)
RBC: 2.21 MIL/uL — ABNORMAL LOW (ref 4.22–5.81)
RDW: 13.2 % (ref 11.5–15.5)
WBC: 8.1 10*3/uL (ref 4.0–10.5)
nRBC: 0 % (ref 0.0–0.2)

## 2019-11-23 LAB — TROPONIN I (HIGH SENSITIVITY)
Troponin I (High Sensitivity): 75 ng/L — ABNORMAL HIGH (ref <18)
Troponin I (High Sensitivity): 70 ng/L — ABNORMAL HIGH (ref <18)

## 2019-11-23 LAB — PREPARE RBC (CROSSMATCH)

## 2019-11-23 MED ORDER — ACETAMINOPHEN 650 MG RE SUPP: 650.0000 mg | Freq: Four times a day (QID) | RECTAL | Status: DC | PRN

## 2019-11-23 MED ORDER — METOPROLOL TARTRATE 25 MG PO TABS
25.0000 mg | ORAL_TABLET | Freq: Every day | ORAL | Status: DC
  Administered 2019-11-23 – 2019-11-28 (×5): 25 mg via ORAL
  Filled 2019-11-23 (×6): qty 1

## 2019-11-23 MED ORDER — TRAZODONE HCL 50 MG PO TABS
50.0000 mg | ORAL_TABLET | Freq: Every day | ORAL | Status: DC
  Administered 2019-11-23 – 2019-11-25 (×2): 50 mg via ORAL
  Filled 2019-11-23 (×2): qty 1

## 2019-11-23 MED ORDER — SODIUM CHLORIDE 0.9 % IV SOLN
10.0000 mL/h | Freq: Once | INTRAVENOUS | Status: AC
  Administered 2019-11-23: 10 mL/h via INTRAVENOUS

## 2019-11-23 MED ORDER — SODIUM CHLORIDE 0.9 % IV SOLN
8.0000 mg/h | INTRAVENOUS | Status: AC
  Administered 2019-11-23 – 2019-11-26 (×5): 8 mg/h via INTRAVENOUS
  Filled 2019-11-23 (×6): qty 80

## 2019-11-23 MED ORDER — ONDANSETRON HCL 4 MG PO TABS: 4.0000 mg | ORAL_TABLET | Freq: Four times a day (QID) | ORAL | Status: DC | PRN

## 2019-11-23 MED ORDER — ONDANSETRON HCL 4 MG/2ML IJ SOLN: 4.0000 mg | Freq: Four times a day (QID) | INTRAMUSCULAR | Status: DC | PRN

## 2019-11-23 MED ORDER — SODIUM CHLORIDE 0.9 % IV SOLN
80.0000 mg | Freq: Once | INTRAVENOUS | Status: AC
  Administered 2019-11-23: 80 mg via INTRAVENOUS
  Filled 2019-11-23: qty 80

## 2019-11-23 MED ORDER — SODIUM CHLORIDE 0.9 % IV SOLN: INTRAVENOUS | Status: DC

## 2019-11-23 MED ORDER — SODIUM CHLORIDE 0.9% FLUSH
3.0000 mL | Freq: Once | INTRAVENOUS | Status: AC
  Administered 2019-11-23: 3 mL via INTRAVENOUS

## 2019-11-23 MED ORDER — ACETAMINOPHEN 325 MG PO TABS
650.0000 mg | ORAL_TABLET | Freq: Four times a day (QID) | ORAL | Status: DC | PRN
  Administered 2019-11-27 – 2019-11-28 (×2): 650 mg via ORAL
  Filled 2019-11-23 (×2): qty 2

## 2019-11-23 MED ORDER — SODIUM CHLORIDE 0.9 % IV BOLUS
1000.0000 mL | Freq: Once | INTRAVENOUS | Status: AC
  Administered 2019-11-23: 1000 mL via INTRAVENOUS

## 2019-11-23 NOTE — H&P (Addendum)
[facility] [place] NAME: Vernon Patterson    MR#:  [id number]  DATE OF BIRTH:  07/03/1955  DATE OF ADMISSION:  11/23/2019  PRIMARY CARE PHYSICIAN: Albina Billet, MD   REQUESTING/REFERRING PHYSICIAN: Arta Silence, MD   CHIEF COMPLAINT:   Chief Complaint  Patient presents with  . Sore Throat  . Weakness    HISTORY OF PRESENT ILLNESS:  Vernon Patterson  is a 64 y.o. Caucasian male with a known history of coronary artery disease, depression, hypertension and anxiety, who presented to the emergency room with acute onset of fatigue and tiredness since Friday with significant diminished appetite.  He noticed to big dark stools and had nausea and vomiting.  He believes he may have seen a maroon color vomitus last night that could have been a clot.  He denied any significant abdominal pain.  No chest pain or dyspnea or cough or wheezing.  No fever or chills.  No hematuria or hemoptysis or other bleeding diathesis.  He takes 2 aspirin daily.  Upon presentation to the emergency room, vital signs were within normal except for borderline blood pressure of 97/67.  Labs revealed borderline potassium of 3.5 and a high-sensitivity troponin I of 75 and later 70.  CBC showed anemia with hemoglobin of 7.4 hematocrit 22 compared to 9.1 and 26.1 on 04/21/2013.  COVID-19 PCR came back negative and EKG showed sinus rhythm with a rate of 85 with PVCs and T wave inversion laterally and inferiorly.  He had positive stool Hemoccult by the ER physician.  The patient was given IV Protonix bolus and drip 1 L bolus of IV normal saline and was typed and crossmatched and was being transfused packed red blood cells.  He will be admitted to a progressive unit bed for further evaluation and management.  PAST MEDICAL HISTORY:   Past Medical History:  Diagnosis Date  . Anxiety   . Coronary artery disease   . Depression   . Heart murmur   . Hypertension     PAST SURGICAL HISTORY:    Past Surgical History:  Procedure Laterality Date  . CARDIAC CATHETERIZATION    . CORONARY ARTERY BYPASS GRAFT    . CORONARY ARTERY BYPASS GRAFT N/A 04/18/2013   Procedure: CORONARY ARTERY BYPASS GRAFTING (CABG);  Surgeon: Gaye Pollack, MD;  Location: [place];  Service: Open Heart Surgery;  Laterality: N/A;  Times  3  using left internal mammary artery and endoscopically harvested right saphenous vein  . ENDOVEIN HARVEST OF GREATER SAPHENOUS VEIN Right 04/18/2013   Procedure: ENDOVEIN HARVEST OF GREATER SAPHENOUS VEIN;  Surgeon: Gaye Pollack, MD;  Location: [place];  Service: Open Heart Surgery;  Laterality: Right;  Some of the saphenous vein from the lower leg also used.  . INTRAOPERATIVE TRANSESOPHAGEAL ECHOCARDIOGRAM N/A 04/18/2013   Procedure: INTRAOPERATIVE TRANSESOPHAGEAL ECHOCARDIOGRAM;  Surgeon: Gaye Pollack, MD;  Location: [facility] OR;  Service: Open Heart Surgery;  Laterality: N/A;    SOCIAL HISTORY:   Social History   Tobacco Use  . Smoking status: Current Every Day Smoker    Types: Cigars  . Smokeless tobacco: Never Used  . Tobacco comment: 2-3 cigars/day  Substance Use Topics  . Alcohol use: Yes    Comment: 5 bottles of wine/week    FAMILY HISTORY:  Positive for diabetes mellitus in his mother and his father died from [place] in his 43s.  DRUG ALLERGIES:  No Known Allergies  REVIEW OF SYSTEMS:  ROS As per history of present illness. All pertinent systems were reviewed above. Constitutional,  HEENT, cardiovascular, respiratory, GI, GU, musculoskeletal, neuro, psychiatric, endocrine,  integumentary and hematologic systems were reviewed and are otherwise  negative/unremarkable except for positive findings mentioned above in the HPI.   MEDICATIONS AT HOME:   Prior to Admission medications   Medication Sig Start Date End Date Taking? Authorizing Provider  aspirin EC 325 MG EC tablet Take 1 tablet (325 mg total) by mouth daily. 04/24/13  Yes Lars Pinks M, PA-C   metoprolol tartrate (LOPRESSOR) 25 MG tablet Take 25 mg by mouth daily.   Yes [provider]  traZODone (DESYREL) 50 MG tablet Take 50 mg by mouth at bedtime. 11/15/19  Yes [provider]      VITAL SIGNS:  Blood pressure 125/81, pulse 62, temperature 98.4 F (36.9 C), temperature source Oral, resp. rate 10, height 5\' 8"  (1.727 m), weight 63 kg, SpO2 100 %.  PHYSICAL EXAMINATION:  Physical Exam  GENERAL:  64 y.o.-year-old Caucasian male patient lying in the bed with mild conversational dyspnea. EYES: Pupils equal, round, reactive to light and accommodation.  Positive pallor.  No scleral icterus. Extraocular muscles intact.  HEENT: Head atraumatic, normocephalic. Oropharynx and nasopharynx clear.  NECK:  Supple, no jugular venous distention. No thyroid enlargement, no tenderness.  LUNGS: Normal breath sounds bilaterally, no wheezing, rales,rhonchi or crepitation. No use of accessory muscles of respiration.  CARDIOVASCULAR: Regular rate and rhythm, S1, S2 normal. No murmurs, rubs, or gallops.  ABDOMEN: Soft, nondistended, nontender. Bowel sounds present. No organomegaly or mass.  He had positive stool Hemoccult with rectal exam by the ER physician. EXTREMITIES: No pedal edema, cyanosis, or clubbing.  NEUROLOGIC: Cranial nerves II through XII are intact. Muscle strength 5/5 in all extremities. Sensation intact. Gait not checked.  PSYCHIATRIC: The patient is alert and oriented x 3.  Normal affect and good eye contact. SKIN: No obvious rash, lesion, or ulcer.   LABORATORY PANEL:   CBC Recent Labs  Lab 11/23/19 1819  WBC 8.1  HGB 7.4*  HCT 22.0*  PLT 213   ------------------------------------------------------------------------------------------------------------------  Chemistries  Recent Labs  Lab 11/23/19 1819  NA 141  K 3.5  CL 107  CO2 22  GLUCOSE 110*  BUN 15  CREATININE 0.81  CALCIUM 9.0    ------------------------------------------------------------------------------------------------------------------  Cardiac Enzymes No results for input(s): TROPONINI in the last 168 hours. ------------------------------------------------------------------------------------------------------------------  RADIOLOGY:  No results found.    IMPRESSION AND PLAN:   1.  GI bleeding likely of upper GI etiology. -The patient will be admitted to a progressive unit bed. -We will follow serial hemoglobins and hematocrits. -We will continue him on IV Protonix drip. -Aspirin will be held off. -A GI consultation will be obtained. -Dr. Alice Reichert was notified about the patient and is aware.  2.  Acute blood loss anemia, due to GI bleeding on top of chronic anemia. -The patient was typed and crossmatch will be transfused 2 units of packed red blood cells. -We will follow posttransfusion H&H.  3.  Hypertension and history of coronary artery disease. -Lopressor will be continued.  4.  DVT prophylaxis -SCDs. -Medical prophylaxis currently contraindicated due to GI bleeding.  5.  GI prophylaxis. -This was addressed above.  All the records are reviewed and case discussed with ED provider. The plan of care was discussed in details with the patient (and family). I answered all questions. The patient agreed to proceed with the above mentioned plan. Further management will depend  upon hospital course.   CODE STATUS: Full code  Status is: Inpatient  Remains inpatient appropriate because:Ongoing diagnostic testing needed not appropriate for outpatient work up, Unsafe d/c plan, IV treatments appropriate due to intensity of illness or inability to take PO and Inpatient level of care appropriate due to severity of illness.   Dispo: The patient is from: Home              Anticipated d/c is to: Home              Anticipated d/c date is: 2 days              Patient currently is not medically stable  to d/c.   TOTAL TIME TAKING CARE OF THIS PATIENT: 55 minutes.    Christel Mormon M.D on 11/23/2019 at 9:20 PM  [facility]   From 7 PM-7 AM, contact night-coverage [email or web address]  CC: Primary care physician; Albina Billet, MD   Note: This dictation was prepared with Dragon dictation along with smaller phrase technology. Any transcriptional typo errors that result from this process are unintentional.

## 2019-11-23 NOTE — ED Notes (Signed)
Blood transfusion increased to 200 mL/hr at this time.

## 2019-11-23 NOTE — ED Triage Notes (Signed)
Pt presents to ED via POV. Pt states has been in bed since Friday, pt states has only eaten ice cream since Friday. Pt states unable to eat anything else. Pt states "I probably hallucinated it but I I swear I gagged a maroon ball about that big (pt indicating about the size of a golf ball)". Pt states significant weight loss in the last 3 months. Pt states increasing fatigue, weakness, and dysphagia.   Pt does appear pale in triage.

## 2019-11-23 NOTE — ED Notes (Signed)
Blood transfusion started at this time at 120 mL/hr.

## 2019-11-23 NOTE — ED Notes (Signed)
Pt states that he's been feeling very tired and weak since last Friday and that he has barely had anything to eat due to lack of appetite. Patient states he's had black stools and is pale in appearance.

## 2019-11-23 NOTE — ED Notes (Signed)
See triage note, pt reports "I have not eaten anything since Friday or been out of bed other than to get ice cream". Pt states he coughed up something "maroon colored" but "might have been hallucinating".  States "I am just sleepy all the time".  Denies pain at this time.  Reports stools "pure black" Pt alert and oriented.

## 2019-11-23 NOTE — ED Provider Notes (Signed)
[facility] Emergency Department Provider Note ____________________________________________   First MD Initiated Contact with Patient 11/23/19 1930     (approximate)  I have reviewed the triage vital signs and the nursing notes.   HISTORY  Chief Complaint Sore Throat and Weakness    HPI Vernon Patterson is a 64 y.o. male with PMH as noted below (there is a history noted below of GI bleed, however the patient denied any history of this (who presents with generalized fatigue and weakness for about the last 5 to 7 days.  It was gradual in onset.  The patient states that he has been unable to get out of bed because of how weak he is, and states he is sleeping all day.  He also reports decreased appetite and difficulty eating.  He has had nausea and one episode of vomiting 5 days ago in which a maroon object came up which may have been a clot.  He also reports that his stools have been black.  He denies any abdominal pain or any fever or chills.  He has no other abnormal bruising or bleeding.  Past Medical History:  Diagnosis Date  . Anxiety   . Coronary artery disease   . Depression   . Heart murmur   . Hypertension     Patient Active Problem List   Diagnosis Date Noted  . GI bleeding 11/23/2019  . CAD (coronary artery disease) 04/18/2013    Past Surgical History:  Procedure Laterality Date  . CARDIAC CATHETERIZATION    . CORONARY ARTERY BYPASS GRAFT    . CORONARY ARTERY BYPASS GRAFT N/A 04/18/2013   Procedure: CORONARY ARTERY BYPASS GRAFTING (CABG);  Surgeon: Gaye Pollack, MD;  Location: [place];  Service: Open Heart Surgery;  Laterality: N/A;  Times  3  using left internal mammary artery and endoscopically harvested right saphenous vein  . ENDOVEIN HARVEST OF GREATER SAPHENOUS VEIN Right 04/18/2013   Procedure: ENDOVEIN HARVEST OF GREATER SAPHENOUS VEIN;  Surgeon: Gaye Pollack, MD;  Location: [place];  Service: Open Heart Surgery;  Laterality: Right;  Some  of the saphenous vein from the lower leg also used.  . INTRAOPERATIVE TRANSESOPHAGEAL ECHOCARDIOGRAM N/A 04/18/2013   Procedure: INTRAOPERATIVE TRANSESOPHAGEAL ECHOCARDIOGRAM;  Surgeon: Gaye Pollack, MD;  Location: [facility] OR;  Service: Open Heart Surgery;  Laterality: N/A;    Prior to Admission medications   Medication Sig Start Date End Date Taking? Authorizing Provider  aspirin EC 325 MG EC tablet Take 1 tablet (325 mg total) by mouth daily. 04/24/13   Nani Skillern, PA-C  benazepril (LOTENSIN) 10 MG tablet Take 1 tablet (10 mg total) by mouth daily. 04/24/13   Nani Skillern, PA-C  citalopram (CELEXA) 40 MG tablet Take 40 mg by mouth daily.    [provider]  metoprolol tartrate (LOPRESSOR) 25 MG tablet Take 0.5 tablets (12.5 mg total) by mouth 2 (two) times daily. 04/24/13   Nani Skillern, PA-C  oxyCODONE-acetaminophen (ROXICET) 5-325 MG tablet Take 1 tablet by mouth every 4 (four) hours as needed for severe pain. 04/08/15   Paulette Blanch, MD  pravastatin (PRAVACHOL) 40 MG tablet Take 1 tablet (40 mg total) by mouth daily. 04/24/13   Nani Skillern, PA-C    Allergies Patient has no known allergies.  No family history on file.  Social History Social History   Tobacco Use  . Smoking status: Current Every Day Smoker    Types: Cigars  . Smokeless tobacco:  Never Used  . Tobacco comment: 2-3 cigars/day  Substance Use Topics  . Alcohol use: Yes    Comment: 5 bottles of wine/week  . Drug use: No    Review of Systems  Constitutional: No fever/chills.  Positive for fatigue. Eyes: No visual changes. ENT: No sore throat. Cardiovascular: Denies chest pain. Respiratory: Denies shortness of breath. Gastrointestinal: Positive for resolved vomiting. Genitourinary: Negative for dysuria or hematuria.  Musculoskeletal: Negative for back pain. Skin: Negative for rash. Neurological: Negative for  headache.   ____________________________________________   PHYSICAL EXAM:  VITAL SIGNS: ED Triage Vitals  Enc Vitals Group     BP 11/23/19 1815 97/67     Pulse Rate 11/23/19 1815 77     Resp 11/23/19 1815 18     Temp 11/23/19 1815 98.4 F (36.9 C)     Temp Source 11/23/19 1815 Oral     SpO2 11/23/19 1815 100 %     Weight 11/23/19 1816 139 lb (63 kg)     Height 11/23/19 1816 5\' 8"  (1.727 m)     Head Circumference --      Peak Flow --      Pain Score 11/23/19 1816 0     Pain Loc --      Pain Edu? --      Excl. in [place]? --     Constitutional: Alert and oriented.  He can pale appearing but in no acute distress. Eyes: Conjunctivae are pale. Head: Atraumatic. Nose: No congestion/rhinnorhea. Mouth/Throat: Mucous membranes are slightly dry.   Neck: Normal range of motion.  Cardiovascular: Normal rate, regular rhythm.   Good peripheral circulation. Respiratory: Normal respiratory effort.  No retractions.  Gastrointestinal: Soft and nontender. No distention.  Brown stool, guaiac positive on DRE. Genitourinary: No flank tenderness. Musculoskeletal:  Extremities warm and well perfused.  Neurologic:  Normal speech and language. No gross focal neurologic deficits are appreciated.  Skin:  Skin is warm and dry. No rash noted. Psychiatric: Mood and affect are normal. Speech and behavior are normal.  ____________________________________________   LABS (all labs ordered are listed, but only abnormal results are displayed)  Labs Reviewed  BASIC METABOLIC PANEL - Abnormal; Notable for the following components:      Result Value   Glucose, Bld 110 (*)    All other components within normal limits  CBC - Abnormal; Notable for the following components:   RBC 2.21 (*)    Hemoglobin 7.4 (*)    HCT 22.0 (*)    All other components within normal limits  TROPONIN I (HIGH SENSITIVITY) - Abnormal; Notable for the following components:   Troponin I (High Sensitivity) 75 (*)    All other  components within normal limits  SARS CORONAVIRUS 2 BY RT PCR (HOSPITAL ORDER, [facility])  URINALYSIS, COMPLETE (UACMP) WITH MICROSCOPIC  CBG MONITORING, ED  TYPE AND SCREEN  PREPARE RBC (CROSSMATCH)  TROPONIN I (HIGH SENSITIVITY)   ____________________________________________  EKG  ED ECG REPORT I, Arta Silence, the attending physician, personally viewed and interpreted this ECG.  Date: 11/23/2019 EKG Time: 1822 Rate: 85 Rhythm: normal sinus rhythm QRS Axis: normal Intervals: normal ST/T Wave abnormalities: Nonspecific T wave abnormalities laterally Narrative Interpretation: Nonspecific T wave abnormalities laterally, new when compared to most recent EKG from 2014   ____________________________________________  RADIOLOGY    ____________________________________________   PROCEDURES  Procedure(s) performed: No  Procedures  Critical Care performed: Yes  CRITICAL CARE Performed by: Arta Silence   Total critical care  time: 30 minutes  Critical care time was exclusive of separately billable procedures and treating other patients.  Critical care was necessary to treat or prevent imminent or life-threatening deterioration.  Critical care was time spent personally by me on the following activities: development of treatment plan with patient and/or surrogate as well as nursing, discussions with consultants, evaluation of patient's response to treatment, examination of patient, obtaining history from patient or surrogate, ordering and performing treatments and interventions, ordering and review of laboratory studies, ordering and review of radiographic studies, pulse oximetry and re-evaluation of patient's condition. ____________________________________________   INITIAL IMPRESSION / ASSESSMENT AND PLAN / ED COURSE  Pertinent labs & imaging results that were available during my care of the patient were reviewed by me and  considered in my medical decision making (see chart for details).  64 year old male with PMH as noted above presents with generalized fatigue and weakness over the last 5 days, some black stools, and a possible episode of vomiting up with blood clot on the initial day of the symptoms.  I reviewed the past medical records in Epic, but the patient has not been in the ED since 2016.  He was last admitted in 2014 for chest pain with EKG changes and NSTEMI.  On exam, he is pale and weak appearing.  His vital signs are normal.  The abdomen is soft and nontender.  The physical exam is otherwise unremarkable.  His stool is brown but guaiac positive.  Initial lab work-up reveals elevated troponin as well as a hemoglobin of 7.4, which appears to be slightly lower than the patient's baseline.  Overall presentation is concerning for subacute GI bleed, likely upper GI etiology.  The patient has no active hematemesis.  I will start him on a Protonix drip, order transfusion of PRBCs, give a fluid bolus, and plan for GI consultation and admission.  ----------------------------------------- 8:55 PM on 11/23/2019 -----------------------------------------  I discussed the case with Dr. Alice Reichert from gastroenterology.  Given the subacute nature of the symptoms and the patient's stable clinical status, there is no indication for acute procedural intervention.  He agrees with current management of Protonix drip and blood transfusion.  I then discussed the case with Dr. Sidney Ace from the hospitalist service for admission.  ____________________________________________   FINAL CLINICAL IMPRESSION(S) / ED DIAGNOSES  Final diagnoses:  Gastrointestinal hemorrhage, unspecified gastrointestinal hemorrhage type      NEW MEDICATIONS STARTED DURING THIS VISIT:  New Prescriptions   No medications on file     Note:  This document was prepared using Dragon voice recognition software and may include unintentional  dictation errors.    Arta Silence, MD 11/23/19 2056

## 2019-11-24 DIAGNOSIS — E43 Unspecified severe protein-calorie malnutrition: Secondary | ICD-10-CM

## 2019-11-24 DIAGNOSIS — R630 Anorexia: Secondary | ICD-10-CM

## 2019-11-24 DIAGNOSIS — D649 Anemia, unspecified: Secondary | ICD-10-CM

## 2019-11-24 DIAGNOSIS — I25118 Atherosclerotic heart disease of native coronary artery with other forms of angina pectoris: Secondary | ICD-10-CM

## 2019-11-24 DIAGNOSIS — K922 Gastrointestinal hemorrhage, unspecified: Secondary | ICD-10-CM

## 2019-11-24 DIAGNOSIS — Z01810 Encounter for preprocedural cardiovascular examination: Secondary | ICD-10-CM

## 2019-11-24 DIAGNOSIS — R011 Cardiac murmur, unspecified: Secondary | ICD-10-CM

## 2019-11-24 LAB — HEMOGLOBIN AND HEMATOCRIT, BLOOD
HCT: 26.1 % — ABNORMAL LOW (ref 39.0–52.0)
HCT: 29 % — ABNORMAL LOW (ref 39.0–52.0)
Hemoglobin: 9.1 g/dL — ABNORMAL LOW (ref 13.0–17.0)
Hemoglobin: 9.9 g/dL — ABNORMAL LOW (ref 13.0–17.0)

## 2019-11-24 LAB — CBC
HCT: 26 % — ABNORMAL LOW (ref 39.0–52.0)
Hemoglobin: 9 g/dL — ABNORMAL LOW (ref 13.0–17.0)
MCH: 32.1 pg (ref 26.0–34.0)
MCHC: 34.6 g/dL (ref 30.0–36.0)
MCV: 92.9 fL (ref 80.0–100.0)
Platelets: 156 10*3/uL (ref 150–400)
RBC: 2.8 MIL/uL — ABNORMAL LOW (ref 4.22–5.81)
RDW: 16.7 % — ABNORMAL HIGH (ref 11.5–15.5)
WBC: 7.3 10*3/uL (ref 4.0–10.5)
nRBC: 0 % (ref 0.0–0.2)

## 2019-11-24 LAB — BASIC METABOLIC PANEL
Anion gap: 7 (ref 5–15)
BUN: 15 mg/dL (ref 8–23)
CO2: 24 mmol/L (ref 22–32)
Calcium: 8.2 mg/dL — ABNORMAL LOW (ref 8.9–10.3)
Chloride: 111 mmol/L (ref 98–111)
Creatinine, Ser: 0.79 mg/dL (ref 0.61–1.24)
GFR calc Af Amer: >60 mL/min (ref >60)
GFR calc non Af Amer: >60 mL/min (ref >60)
Glucose, Bld: 99 mg/dL (ref 70–99)
Potassium: 3.8 mmol/L (ref 3.5–5.1)
Sodium: 142 mmol/L (ref 135–145)

## 2019-11-24 LAB — LIPID PANEL
Cholesterol: 119 mg/dL (ref 0–200)
HDL: 19 mg/dL — ABNORMAL LOW (ref >40)
LDL Cholesterol: 80 mg/dL (ref 0–99)
Total CHOL/HDL Ratio: 6.3 RATIO
Triglycerides: 99 mg/dL (ref <150)
VLDL: 20 mg/dL (ref 0–40)

## 2019-11-24 LAB — HIV ANTIBODY (ROUTINE TESTING W REFLEX): HIV Screen 4th Generation wRfx: NONREACTIVE

## 2019-11-24 LAB — TSH: TSH: 2.366 u[IU]/mL (ref 0.350–4.500)

## 2019-11-24 LAB — VITAMIN B12: Vitamin B-12: 233 pg/mL (ref 180–914)

## 2019-11-24 LAB — FOLATE: Folate: 10.3 ng/mL (ref >5.9)

## 2019-11-24 LAB — PREPARE RBC (CROSSMATCH)

## 2019-11-24 MED ORDER — SODIUM CHLORIDE 0.9 % IV SOLN
INTRAVENOUS | Status: DC
  Administered 2019-11-26: 1000 mL via INTRAVENOUS

## 2019-11-24 MED ORDER — SODIUM CHLORIDE 0.9% IV SOLUTION: Freq: Once | INTRAVENOUS | Status: AC

## 2019-11-24 MED ORDER — ZOLPIDEM TARTRATE 5 MG PO TABS
5.0000 mg | ORAL_TABLET | Freq: Every evening | ORAL | Status: DC | PRN
  Administered 2019-11-24 – 2019-11-27 (×3): 5 mg via ORAL
  Filled 2019-11-24 (×3): qty 1

## 2019-11-24 NOTE — Plan of Care (Signed)
GI consult completed, and plan is for EGD tomorrow.  Pt moved to clear liquid diet.  Up to BR with stand-by assistance.  Problem: Education: Goal: Knowledge of General Education information will improve Description: Including pain rating scale, medication(s)/side effects and non-pharmacologic comfort measures Outcome: Progressing   Problem: Health Behavior/Discharge Planning: Goal: Ability to manage health-related needs will improve Outcome: Progressing   Problem: Clinical Measurements: Goal: Ability to maintain clinical measurements within normal limits will improve Outcome: Progressing Goal: Will remain free from infection Outcome: Progressing Goal: Diagnostic test results will improve Outcome: Progressing Goal: Respiratory complications will improve Outcome: Progressing Goal: Cardiovascular complication will be avoided Outcome: Progressing   Problem: Activity: Goal: Risk for activity intolerance will decrease Outcome: Progressing   Problem: Nutrition: Goal: Adequate nutrition will be maintained Outcome: Progressing   Problem: Coping: Goal: Level of anxiety will decrease Outcome: Progressing   Problem: Elimination: Goal: Will not experience complications related to bowel motility Outcome: Progressing Goal: Will not experience complications related to urinary retention Outcome: Progressing   Problem: Pain Managment: Goal: General experience of comfort will improve Outcome: Progressing   Problem: Safety: Goal: Ability to remain free from injury will improve Outcome: Progressing   Problem: Skin Integrity: Goal: Risk for impaired skin integrity will decrease Outcome: Progressing   Problem: Education: Goal: Ability to identify signs and symptoms of gastrointestinal bleeding will improve Outcome: Progressing   Problem: Bowel/Gastric: Goal: Will show no signs and symptoms of gastrointestinal bleeding Outcome: Progressing   Problem: Fluid Volume: Goal: Will  show no signs and symptoms of excessive bleeding Outcome: Progressing   Problem: Clinical Measurements: Goal: Complications related to the disease process, condition or treatment will be avoided or minimized Outcome: Progressing

## 2019-11-24 NOTE — Progress Notes (Signed)
PROGRESS NOTE    Vernon Patterson  VZC:[id number] DOB: [birth year] DOA: 11/23/2019 PCP: Albina Billet, MD   Assessment & Plan:   Active Problems:   GI bleeding  GI bleeding: etiology unclear, possible upper GI bleed. Will go for EGD tomorrow as per GI. Clear liquid diet and then NPO after midnight. Continue on IV protonix. Continue to hold aspirin. GI following and recs apprec. Cardio consulted for cardiac clearance as per GI request. Will continue to monitor H&H   Elevated troponins: no chest pain. Trending down. Likely due to demand ischemia   Acute blood loss anemia: due to GI bleeding as well as chronic normocytic anemia.  S/p 2 units of pRBCs.   Hypertension: continue on metoprolol    DVT prophylaxis: SCDs Code Status: full  Family Communication:  Disposition Plan: likely d/c back home   Consultants:   GI  Cardio    Procedures:    Antimicrobials:    Subjective: Pt c/o being thirsty   Objective: Vitals:   11/24/19 0118 11/24/19 0347 11/24/19 0539 11/24/19 0757  BP: (!) 105/56 136/87 127/71 131/75  Pulse: (!) 52 (!) 53 (!) 55 (!) 53  Resp: 18 18  18   Temp: 98.3 F (36.8 C) 97.9 F (36.6 C) 98.5 F (36.9 C) 98 F (36.7 C)  TempSrc: Oral Oral    SpO2: 100% 100% 100% 98%  Weight:   58.9 kg   Height:        Intake/Output Summary (Last 24 hours) at 11/24/2019 0804 Last data filed at 11/24/2019 0552 Gross per 24 hour  Intake 1652.43 ml  Output 100 ml  Net 1552.43 ml   Filed Weights   11/23/19 1816 11/23/19 2325 11/24/19 0539  Weight: 63 kg 58.9 kg 58.9 kg    Examination:  General exam: Appears calm and comfortable  Respiratory system: Clear to auscultation. Respiratory effort normal. No rales, wheezes Cardiovascular system: S1 & S2+. No rubs, gallops or clicks. Gastrointestinal system: Abdomen is nondistended, soft and nontender. Hypoactive bowel sounds heard. Central nervous system: Alert and oriented. Moves all 4 extremities  Psychiatry:  Judgement and insight appear normal. Mood & affect appropriate.     Data Reviewed: I have personally reviewed following labs and imaging studies  CBC: Recent Labs  Lab 11/23/19 1819 11/24/19 0513  WBC 8.1 7.3  HGB 7.4* 9.0*  HCT 22.0* 26.0*  MCV 99.5 92.9  PLT 213 128   Basic Metabolic Panel: Recent Labs  Lab 11/23/19 1819 11/24/19 0513  NA 141 142  K 3.5 3.8  CL 107 111  CO2 22 24  GLUCOSE 110* 99  BUN 15 15  CREATININE 0.81 0.79  CALCIUM 9.0 8.2*   GFR: Estimated Creatinine Clearance: 75.7 mL/min (by C-G formula based on SCr of 0.79 mg/dL). Liver Function Tests: No results for input(s): AST, ALT, ALKPHOS, BILITOT, PROT, ALBUMIN in the last 168 hours. No results for input(s): LIPASE, AMYLASE in the last 168 hours. No results for input(s): AMMONIA in the last 168 hours. Coagulation Profile: No results for input(s): INR, PROTIME in the last 168 hours. Cardiac Enzymes: No results for input(s): CKTOTAL, CKMB, CKMBINDEX, TROPONINI in the last 168 hours. BNP (last 3 results) No results for input(s): PROBNP in the last 8760 hours. HbA1C: No results for input(s): HGBA1C in the last 72 hours. CBG: No results for input(s): GLUCAP in the last 168 hours. Lipid Profile: No results for input(s): CHOL, HDL, LDLCALC, TRIG, CHOLHDL, LDLDIRECT in the last 72 hours. Thyroid Function Tests: No  results for input(s): TSH, T4TOTAL, FREET4, T3FREE, THYROIDAB in the last 72 hours. Anemia Panel: No results for input(s): VITAMINB12, FOLATE, FERRITIN, TIBC, IRON, RETICCTPCT in the last 72 hours. Sepsis Labs: No results for input(s): PROCALCITON, LATICACIDVEN in the last 168 hours.  Recent Results (from the past 240 hour(s))  SARS Coronavirus 2 by RT PCR (hospital order, performed in [facility] hospital lab) Nasopharyngeal Nasopharyngeal Swab     Status: None   Collection Time: 11/23/19  8:10 PM   Specimen: Nasopharyngeal Swab  Result Value Ref Range Status   SARS Coronavirus 2  NEGATIVE NEGATIVE Final    Comment: (NOTE) SARS-CoV-2 target nucleic acids are NOT DETECTED.  The SARS-CoV-2 RNA is generally detectable in upper and lower respiratory specimens during the acute phase of infection. The lowest concentration of SARS-CoV-2 viral copies this assay can detect is 250 copies / mL. A negative result does not preclude SARS-CoV-2 infection and should not be used as the sole basis for treatment or other patient management decisions.  A negative result may occur with improper specimen collection / handling, submission of specimen other than nasopharyngeal swab, presence of viral mutation(s) within the areas targeted by this assay, and inadequate number of viral copies (<250 copies / mL). A negative result must be combined with clinical observations, patient history, and epidemiological information.  Fact Sheet for Patients:   StrictlyIdeas.no  Fact Sheet for Healthcare Providers: BankingDealers.co.za  This test is not yet approved or  cleared by the [place] FDA and has been authorized for detection and/or diagnosis of SARS-CoV-2 by FDA under an Emergency Use Authorization (EUA).  This EUA will remain in effect (meaning this test can be used) for the duration of the COVID-19 declaration under Section 564(b)(1) of the Act, 21 U.S.C. section 360bbb-3(b)(1), unless the authorization is terminated or revoked sooner.  Performed at [facility], [address], [place], [place] [address]          Radiology Studies: No results found.      Scheduled Meds: . metoprolol tartrate  25 mg Oral Daily  . traZODone  50 mg Oral QHS   Continuous Infusions: . sodium chloride 100 mL/hr at 11/23/19 2333  . pantoprozole (PROTONIX) infusion 8 mg/hr (11/23/19 2141)     LOS: 1 day    Time spent: 31 mins    Wyvonnia Dusky, MD [facility] Pager [phone]  If 7PM-7AM, please contact  night-coverage [email or web address] 11/24/2019, 8:04 AM

## 2019-11-24 NOTE — Consult Note (Signed)
Vernon Patterson , MD [address], [place], [place], [place], [address] [address] [place], [place], [place], [address] Phone: [phone]  Fax: [phone]  Consultation  Referring Provider:   ER  Primary Care Physician:  Albina Billet, MD Primary Gastroenterologist:  None          Reason for Consultation:     GI bleed  Date of Admission:  11/23/2019 Date of Consultation:  11/24/2019         HPI:   Vernon Patterson is a 64 y.o. male presented to the ER with fatigue and weakness for a week, 5 days back nausea with vomiting and one episode when ?blood clot seen. Stools been black . EKG T wave inversion noted in lateral leads. On 2 asprins daily   On admission Hb 7.4 grams with MCV 99 . BUN/Cr ratio normal. This morning Hb 9 grams post transfusion   He states that about a week back he had some vomiting with a small quantity of blood and one episode of the vomitus.  Subsequently developed some melena for a few days and stopped.  Last episode was a few days back.  Very mild upper abdominal pain.  Denies any NSAID use.  Not had a bowel movement for over a day.  Not in any pain or distress.  No other complaints.  He says that he does not drink any alcohol in excess quantity.  Past Medical History:  Diagnosis Date  . Anxiety   . Coronary artery disease   . Depression   . Heart murmur   . Hypertension     Past Surgical History:  Procedure Laterality Date  . CARDIAC CATHETERIZATION    . CORONARY ARTERY BYPASS GRAFT    . CORONARY ARTERY BYPASS GRAFT N/A 04/18/2013   Procedure: CORONARY ARTERY BYPASS GRAFTING (CABG);  Surgeon: Gaye Pollack, MD;  Location: [place];  Service: Open Heart Surgery;  Laterality: N/A;  Times  3  using left internal mammary artery and endoscopically harvested right saphenous vein  . ENDOVEIN HARVEST OF GREATER SAPHENOUS VEIN Right 04/18/2013   Procedure: ENDOVEIN HARVEST OF GREATER SAPHENOUS VEIN;  Surgeon: Gaye Pollack, MD;  Location: [place];  Service: Open  Heart Surgery;  Laterality: Right;  Some of the saphenous vein from the lower leg also used.  . INTRAOPERATIVE TRANSESOPHAGEAL ECHOCARDIOGRAM N/A 04/18/2013   Procedure: INTRAOPERATIVE TRANSESOPHAGEAL ECHOCARDIOGRAM;  Surgeon: Gaye Pollack, MD;  Location: [facility] OR;  Service: Open Heart Surgery;  Laterality: N/A;    Prior to Admission medications   Medication Sig Start Date End Date Taking? Authorizing Provider  aspirin EC 325 MG EC tablet Take 1 tablet (325 mg total) by mouth daily. 04/24/13  Yes Lars Pinks M, PA-C  metoprolol tartrate (LOPRESSOR) 25 MG tablet Take 25 mg by mouth daily.   Yes [provider]  traZODone (DESYREL) 50 MG tablet Take 50 mg by mouth at bedtime. 11/15/19  Yes [provider]    History reviewed. No pertinent family history.   Social History   Tobacco Use  . Smoking status: Current Every Day Smoker    Types: Cigars  . Smokeless tobacco: Never Used  . Tobacco comment: 2-3 cigars/day  Substance Use Topics  . Alcohol use: Yes    Comment: 5 bottles of wine/week  . Drug use: No    Allergies as of 11/23/2019  . (No Known Allergies)    Review of Systems:    All systems reviewed and negative  except where noted in HPI.   Physical Exam:  Vital signs in last 24 hours: Temp:  [97.7 F (36.5 C)-98.5 F (36.9 C)] 98 F (36.7 C) (07/15 0757) Pulse Rate:  [52-77] 53 (07/15 0757) Resp:  [10-20] 18 (07/15 0757) BP: (87-136)/(56-97) 131/75 (07/15 0757) SpO2:  [98 %-100 %] 98 % (07/15 0757) Weight:  [58.9 kg-63 kg] 58.9 kg (07/15 0539)   General:   Pleasant, cooperative in NAD Head:  Normocephalic and atraumatic. Eyes:   No icterus.   Conjunctiva pink. PERRLA. Ears:  Normal auditory acuity. Neck:  Supple; no masses or thyroidomegaly Lungs: Respirations even and unlabored. Lungs clear to auscultation bilaterally.   No wheezes, crackles, or rhonchi.  Heart:  Regular rate and rhythm;  Without murmur, clicks, rubs or gallops Abdomen:   Soft, nondistended, nontender. Normal bowel sounds. No appreciable masses or hepatomegaly.  No rebound or guarding.  Neurologic:  Alert and oriented x3;  grossly normal neurologically. Skin:  Intact without significant lesions or rashes. Cervical Nodes:  No significant cervical adenopathy. Psych:  Alert and cooperative. Normal affect.  LAB RESULTS: Recent Labs    11/23/19 1819 11/24/19 0513  WBC 8.1 7.3  HGB 7.4* 9.0*  HCT 22.0* 26.0*  PLT 213 156   BMET Recent Labs    11/23/19 1819 11/24/19 0513  NA 141 142  K 3.5 3.8  CL 107 111  CO2 22 24  GLUCOSE 110* 99  BUN 15 15  CREATININE 0.81 0.79  CALCIUM 9.0 8.2*   LFT No results for input(s): PROT, ALBUMIN, AST, ALT, ALKPHOS, BILITOT, BILIDIR, IBILI in the last 72 hours. PT/INR No results for input(s): LABPROT, INR in the last 72 hours.  STUDIES: No results found.    Impression / Plan:   Vernon Patterson is a 64 y.o. y/o male admitted with fatigue, noted to be anemic, recent history of 1 episode of blood clot in  Vomitus and melena   Plan  1. IV PPI 2. EGD tomorrow after cardiac clearance for T wave inversions on EKG in lateral leads and elevated Troponin  3. Monitor CBC and transfuse  4. Check B12 and folate as MCV is 99   I have discussed alternative options, risks & benefits,  which include, but are not limited to, bleeding, infection, perforation,respiratory complication & drug reaction.  The patient agrees with this plan & written consent will be obtained.      Thank you for involving me in the care of this patient.      LOS: 1 day   Vernon Bellows, MD  11/24/2019, 9:33 AM

## 2019-11-24 NOTE — Care Management Important Message (Signed)
Important Message  Patient Details  Name: Vernon Patterson MRN: [id number] Date of Birth: [birth year]   Medicare Important Message Given:  Yes  Initial Medicare IM given by Patient Access Associate on 11/24/2019 at 11:47am.    Dannette Barbara 11/24/2019, 8:01 PM

## 2019-11-24 NOTE — Consult Note (Addendum)
Cardiology Consultation:   Patient ID: Vernon Patterson MRN: [id number]; DOB: 1956/01/08  Admit date: 11/23/2019 Date of Consult: 11/24/2019  Primary Care Provider: Albina Billet, MD Primary Cardiologist: Previously Dr. Humphrey Rolls; [facility], Dr. Rockey Situ rounding Primary Electrophysiologist:  None    Patient Profile:   Vernon Patterson is a 64 y.o. male with a hx of CABG 04/18/2013 (LIMA to LAD, SVG to OM1, and SVG to PDA), alcohol use, current tobacco use, and who is being seen today for the evaluation of preoperative evaluation before endoscopy at the request of Dr. Jimmye Norman.  History of Present Illness:   Mr. [name] is a 64 year old male with history as above.    He has a history CAD s/p CABG 04/18/2013 by Dr. Caffie Pinto.  He reports family history of heart disease, including a father with heart attack in his 47s.  He presented to [facility] 04/2013 with chest pain.  Initial troponin was negative with second troponin 0.25.  He was started on Plavix with LHC same day that showed 90% distal LM stenosis and 70% proximal RCA stenosis with normal LV function.  He had no gradient across the aortic valve.  He was transferred to [facility] for emergent CABG.  He underwent LIMA to LAD, SVG to OM1, and SVG to PDA.  It was noted in his operative note that he required transfusion during CABG due to being on Plavix.  Since that time, he reports that he is not regularly followed up with a cardiologist.  He followed up with Dr. Mohammed Kindle 05/18/2013.  At that time, he was taking ASA 325 mg daily, benazepril 10 mg daily, metoprolol tartrate 12.5 mg twice daily, and pravastatin 40 mg daily. CXR without significant findings. It was felt that he was doing well.  He was recommended to follow-up with Dr. Humphrey Rolls and Dr. Hall Busing but lost to follow-up after that time.  He reports today that he became frustrated with his care due to frequency of testing and insurance coverage.  He prefers to follow-up with [facility] cardiology at this time.  Since then,  he reports medication compliance with ASA, metoprolol, and lisinopril.   He has a history of alcohol use, reportedly stopping ~ 5 months ago.  He reports that he used to drink 4 cups of wine daily but quit February 2021. He reports that he used to sleep well when drinking alcohol and no longer can sleep well at night.  He has a history of current tobacco use but is attempting to cut back.  He reports that he used to smoke approximately 4 cigars daily; however, he is down to 1 cigar daily.  Over the last 3 months, he reports a weight loss of approximately 30 pounds.  He reports GI issues but denies any chest pain since his CABG.  He also denies any associated symptoms of heart failure since his CABG.  Of note, on review of EMR, he has had a history of heart murmur per review of CABG documentation, and also heard on today's exam. Unfortunately, I am unable to locate intraoperative TEE or any previous TTE on review of EMR/CareEverywhere.   On 11/23/2019, he presented to [facility] due to weakness and fatigue.  In addition, he reported coughing up what appeared to be a ball of blood on 11/18/2019.  As above, he denied any recent or current angina or concerning symptoms of worsening heart failure leading up to his admission. No chest pain, palpitations, dyspnea, pnd, orthopnea, n, v, syncope, edema, weight gain, or early  satiety.  He reported weight loss as above over the last 3 months.  He does note chronic dizziness without clear triggers.  No loss of consciousness.Marland Kitchen  He states he has not remained active since his CABG, as even walking from the hospital to his car would cause bilateral leg weakness.  He denied bilateral claudication or pain of the lower extremities, clarifying that it was more a leg weakness that would prevent him from walking.  He denied any shortness of breath or chest pain with walking.  He states he has not recently climbed a flight of stairs but feels that he would be able to do so.  He is able  to complete his daily living activities. He is currently working a part-time job in Leisure centre manager.    Initial labs on presentation to the [facility] ED showed hypokalemia K 3.5, stable renal function, HS Tn 75, severe anemia with Hgb 7.4.  EKG with possible ST changes noted in V3-V6 changes but unclear given significant artifact and baseline wander.  Unclear if poor R wave progression in I-III versus lead placement. Repeat EKG pending. PACs noted on EKG and telemetry.  Chest x-ray without active cardiopulmonary disease.  Ultrasound of the abdomen showed increased echogenicity within the liver, likely fatty infiltration.  Heart Pathway Score:     Past Medical History:  Diagnosis Date  . Anxiety   . Coronary artery disease   . Depression   . Heart murmur   . Hypertension     Past Surgical History:  Procedure Laterality Date  . CARDIAC CATHETERIZATION    . CORONARY ARTERY BYPASS GRAFT    . CORONARY ARTERY BYPASS GRAFT N/A 04/18/2013   Procedure: CORONARY ARTERY BYPASS GRAFTING (CABG);  Surgeon: Gaye Pollack, MD;  Location: [place];  Service: Open Heart Surgery;  Laterality: N/A;  Times  3  using left internal mammary artery and endoscopically harvested right saphenous vein  . ENDOVEIN HARVEST OF GREATER SAPHENOUS VEIN Right 04/18/2013   Procedure: ENDOVEIN HARVEST OF GREATER SAPHENOUS VEIN;  Surgeon: Gaye Pollack, MD;  Location: [place];  Service: Open Heart Surgery;  Laterality: Right;  Some of the saphenous vein from the lower leg also used.  . INTRAOPERATIVE TRANSESOPHAGEAL ECHOCARDIOGRAM N/A 04/18/2013   Procedure: INTRAOPERATIVE TRANSESOPHAGEAL ECHOCARDIOGRAM;  Surgeon: Gaye Pollack, MD;  Location: [facility] OR;  Service: Open Heart Surgery;  Laterality: N/A;     Home Medications:  Prior to Admission medications   Medication Sig Start Date End Date Taking? Authorizing Provider  aspirin EC 325 MG EC tablet Take 1 tablet (325 mg total) by mouth daily. 04/24/13  Yes Lars Pinks M, PA-C  metoprolol  tartrate (LOPRESSOR) 25 MG tablet Take 25 mg by mouth daily.   Yes [provider]  traZODone (DESYREL) 50 MG tablet Take 50 mg by mouth at bedtime. 11/15/19  Yes [provider]    Inpatient Medications: Scheduled Meds: . metoprolol tartrate  25 mg Oral Daily  . traZODone  50 mg Oral QHS   Continuous Infusions: . sodium chloride 100 mL/hr at 11/24/19 1013  . pantoprozole (PROTONIX) infusion 8 mg/hr (11/24/19 1013)   PRN Meds: acetaminophen **OR** acetaminophen, ondansetron **OR** ondansetron (ZOFRAN) IV, zolpidem  Allergies:   No Known Allergies  Social History:   Social History   Socioeconomic History  . Marital status: Married    Spouse name: Not on file  . Number of children: Not on file  . Years of education: Not on file  . Highest  education level: Not on file  Occupational History  . Not on file  Tobacco Use  . Smoking status: Current Every Day Smoker    Types: Cigars  . Smokeless tobacco: Never Used  . Tobacco comment: 2-3 cigars/day  Substance and Sexual Activity  . Alcohol use: Yes    Comment: 5 bottles of wine/week  . Drug use: No  . Sexual activity: Never  Other Topics Concern  . Not on file  Social History Narrative  . Not on file   Social Determinants of Health   Financial Resource Strain:   . Difficulty of Paying Living Expenses:   Food Insecurity:   . Worried About Charity fundraiser in the Last Year:   . Arboriculturist in the Last Year:   Transportation Needs:   . Film/video editor (Medical):   Marland Kitchen Lack of Transportation (Non-Medical):   Physical Activity:   . Days of Exercise per Week:   . Minutes of Exercise per Session:   Stress:   . Feeling of Stress :   Social Connections:   . Frequency of Communication with Friends and Family:   . Frequency of Social Gatherings with Friends and Family:   . Attends Religious Services:   . Active Member of Clubs or Organizations:   . Attends Archivist Meetings:   Marland Kitchen  Marital Status:   Intimate Partner Violence:   . Fear of Current or Ex-Partner:   . Emotionally Abused:   Marland Kitchen Physically Abused:   . Sexually Abused:     Family History:   History reviewed. No pertinent family history.  Father with MI in his 70s  ROS:  Please see the history of present illness.  Review of Systems  Respiratory: Positive for cough and hemoptysis. Negative for shortness of breath.   Cardiovascular: Negative for chest pain, palpitations, orthopnea, claudication, leg swelling and PND.       Leg weakness  Gastrointestinal: Positive for abdominal pain, melena, nausea and vomiting.  Musculoskeletal: Negative for falls.  Neurological: Positive for dizziness.  All other systems reviewed and are negative.   All other ROS reviewed and negative.     Physical Exam/Data:   Vitals:   11/24/19 0347 11/24/19 0539 11/24/19 0757 11/24/19 1207  BP: 136/87 127/71 131/75 118/79  Pulse: (!) 53 (!) 55 (!) 53 (!) 59  Resp: 18  18 18   Temp: 97.9 F (36.6 C) 98.5 F (36.9 C) 98 F (36.7 C) 98 F (36.7 C)  TempSrc: Oral  Oral Oral  SpO2: 100% 100% 98% 100%  Weight:  58.9 kg    Height:        Intake/Output Summary (Last 24 hours) at 11/24/2019 1348 Last data filed at 11/24/2019 1215 Gross per 24 hour  Intake 2208.09 ml  Output 675 ml  Net 1533.09 ml   Last 3 Weights 11/24/2019 11/23/2019 11/23/2019  Weight (lbs) 129 lb 14.4 oz 129 lb 14.4 oz 139 lb  Weight (kg) 58.922 kg 58.922 kg 63.05 kg     Body mass index is 19.75 kg/m.  General: Frail male, NAD HEENT: normal Neck: no JVD Vascular: No carotid bruits as suspect systolic murmur radiated into the carotids; radial pulses 2+ bilaterally Cardiac:  normal S1, S2; RRR; ++9/2 systolic murmur heard throughout the cardiac auscultation and best appreciated in the RUSB Lungs:  clear to auscultation bilaterally, ++wheezing, rhonchi or rales  Abd: soft, nontender, no hepatomegaly  Ext: no edema Musculoskeletal:  No deformities, BUE  and BLE  strength normal and equal Skin: warm and dry  Neuro:  No focal abnormalities noted Psych:  Normal affect   EKG:  The EKG was personally reviewed and demonstrates: NSR, PACs, significant baseline wander and artifact, likely down-sloping ST changes in precordial leads V3-V6 but unclear due to wander and artifact. Also unclear if lead placement is an issue in I-III.  Repeat EKG pending. Telemetry:  Telemetry was personally reviewed and demonstrates: NSR, SB, PACs  Relevant CV Studies: Pending ordered echo  Laboratory Data:  High Sensitivity Troponin:   Recent Labs  Lab 11/23/19 1819 11/23/19 2010  TROPONINIHS 75* 70*     Cardiac EnzymesNo results for input(s): TROPONINI in the last 168 hours. No results for input(s): TROPIPOC in the last 168 hours.  Chemistry Recent Labs  Lab 11/23/19 1819 11/24/19 0513  NA 141 142  K 3.5 3.8  CL 107 111  CO2 22 24  GLUCOSE 110* 99  BUN 15 15  CREATININE 0.81 0.79  CALCIUM 9.0 8.2*  GFRNONAA >60 >60  GFRAA >60 >60  ANIONGAP 12 7    No results for input(s): PROT, ALBUMIN, AST, ALT, ALKPHOS, BILITOT in the last 168 hours. Hematology Recent Labs  Lab 11/23/19 1819 11/24/19 0513 11/24/19 0929  WBC 8.1 7.3  --   RBC 2.21* 2.80*  --   HGB 7.4* 9.0* 9.1*  HCT 22.0* 26.0* 26.1*  MCV 99.5 92.9  --   MCH 33.5 32.1  --   MCHC 33.6 34.6  --   RDW 13.2 16.7*  --   PLT 213 156  --    BNPNo results for input(s): BNP, PROBNP in the last 168 hours.  DDimer No results for input(s): DDIMER in the last 168 hours.   Radiology/Studies:  No results found.  Assessment and Plan:   Preoperative cardiac evaluation --Scheduled for endoscopy tomorrow 11/25/2019. Surgery specific risk low.  --No reported angina or recent history of angina or concerning sx.   --Euvolemic and well compensated on exam.   --Functional capacity low at  ~4 METS as above in HPI.  --Recommend repeat EKG given quality of initial EKG. --Given 2/6 systolic murmur on  exam, echocardiogram ordered, pending. --Calculated RCRI score 1, Class II risk, 0.9% risk of major cardiac event.  --Pending echo and repeat EKG. Given the severity of his anemia and current GIB, reasonable to proceed with endoscopy without additional testing or intervention. If reduced EF, further ischemic workup needed once recovered from GIB.  No plan for emergent or further ischemic workup at this time. Recommend discontinuing ASA given GIB with restart at 81mg  daily once felt safe to do so by GI team. Unless history of CVA, would caution against further high dose ASA administration. Following this admission, recommend establishing with our office and regular follow-up.  CAD s/p CABG --No current CP. H/o 2014 CABG as above. Lost to follow-up. Reports medications include ASA 325 mg, lisinopril, and metoprolol.  He was not discharged on Plavix on review of EMR. Given current admission with GIB, recommend discontinue ASA 325mg  and restart per surgery at reduced 81mg  daily, as 352mg  daily increases risk of recurrent GIB. He does not report being on a statin at this time. Per GDMT, recommend addition of statin and PRN SL nitro. Echo pending as above. Further ischemic workup recommended if reduced EF or acute structural changes. Strong recommendation for office follow-up after this admission.   Elevated HS Tn --No CP. Likely supply demand ischemia. Minimally elevated and flat trending. Repeat EKG pending.  Echo pending. No need for emergent ischemic workup at this time.   Systolic murmur --Echo pending.   Anemia --Consider transfusion below 8.0. Likely 2/2 GIB. Restart of ASA per GI and recommend 81mg  over 325mg . Per IM/GI.  Remainder per IM    For questions or updates, please contact [place] Please consult [email or web address] for contact info under     Signed, Arvil Chaco, PA-C  11/24/2019 1:48 PM

## 2019-11-25 ENCOUNTER — Telehealth: Payer: Self-pay | Admitting: *Deleted

## 2019-11-25 ENCOUNTER — Encounter: Payer: Self-pay | Admitting: Anesthesiology

## 2019-11-25 ENCOUNTER — Encounter: Payer: Self-pay | Admitting: *Deleted

## 2019-11-25 ENCOUNTER — Inpatient Hospital Stay (HOSPITAL_COMMUNITY)
Admit: 2019-11-25 | Discharge: 2019-11-25 | Disposition: A | Discharge status: Home / Self Care | Payer: Medicare Other | Attending: Physician Assistant | Admitting: Physician Assistant

## 2019-11-25 DIAGNOSIS — I350 Nonrheumatic aortic (valve) stenosis: Secondary | ICD-10-CM

## 2019-11-25 DIAGNOSIS — D500 Iron deficiency anemia secondary to blood loss (chronic): Secondary | ICD-10-CM

## 2019-11-25 DIAGNOSIS — I351 Nonrheumatic aortic (valve) insufficiency: Secondary | ICD-10-CM

## 2019-11-25 DIAGNOSIS — I340 Nonrheumatic mitral (valve) insufficiency: Secondary | ICD-10-CM

## 2019-11-25 LAB — TYPE AND SCREEN
ABO/RH(D): O NEG
Antibody Screen: NEGATIVE
Unit division: 0
Unit division: 0

## 2019-11-25 LAB — ECHOCARDIOGRAM COMPLETE
AR max vel: 0.82 cm2
AV Area VTI: 0.86 cm2
AV Area mean vel: 0.95 cm2
AV Mean grad: 38 mmHg
AV Peak grad: 64.6 mmHg
Ao pk vel: 4.02 m/s
Area-P 1/2: 4.04 cm2
Height: 68 in
S' Lateral: 2.13 cm
Weight: 2078.4 oz

## 2019-11-25 LAB — BPAM RBC
Blood Product Expiration Date: 202107152359
Blood Product Expiration Date: 202107202359
ISSUE DATE / TIME: 202107142115
ISSUE DATE / TIME: 202107150037
Unit Type and Rh: 9500
Unit Type and Rh: 9500

## 2019-11-25 LAB — HEMOGLOBIN A1C
Hgb A1c MFr Bld: 5.1 % (ref 4.8–5.6)
Mean Plasma Glucose: 99.67 mg/dL

## 2019-11-25 MED ORDER — ENSURE ENLIVE PO LIQD
237.0000 mL | Freq: Three times a day (TID) | ORAL | Status: DC
  Administered 2019-11-27: 237 mL via ORAL

## 2019-11-25 MED ORDER — VITAMIN B-12 1000 MCG PO TABS
1000.0000 ug | ORAL_TABLET | Freq: Every day | ORAL | Status: DC
  Administered 2019-11-25 – 2019-11-28 (×4): 1000 ug via ORAL
  Filled 2019-11-25 (×4): qty 1

## 2019-11-25 NOTE — Plan of Care (Signed)
  Problem: Nutrition: Goal: Adequate nutrition will be maintained Outcome: Not Progressing  Refusing nutritional supplements Problem: Pain Managment: Goal: General experience of comfort will improve Outcome: Progressing   Problem: Education: Goal: Ability to identify signs and symptoms of gastrointestinal bleeding will improve Outcome: Progressing   Problem: Fluid Volume: Goal: Will show no signs and symptoms of excessive bleeding Outcome: Progressing

## 2019-11-25 NOTE — Progress Notes (Addendum)
PROGRESS NOTE    Vernon Patterson  VEL:[id number] DOB: 1956/02/21 DOA: 11/23/2019 PCP: Albina Billet, MD   Assessment & Plan:   Active Problems:   GI bleeding  GI bleeding: etiology unclear, possible upper GI bleed.  EGD today was canceled secondary to the water issue and hopefully EGD will be done tomorrow. Clear liquid diet and then NPO after midnight. Continue on IV protonix. Continue to hold aspirin. GI following and recs apprec.  Will continue to monitor H&H   Alcohol abuse: alcohol cessation counseling. Pt states if it "makes me feel good, I'll continue to drink it"   Severe aortic stenosis: as per echo. Will need cardiac cath & likely TAVR which was discussed w/ pt by cardio. Work-up will continue as an outpatient   Elevated troponins: no chest pain. Trending down. Likely due to demand ischemia   Acute blood loss anemia: due to GI bleeding as well as chronic normocytic anemia.  S/p 2 units of pRBCs. Will continue to monitor H&H  Hypertension: continue on metoprolol   Severe protein calorie nutrition: will start ensure. Will consult nutrition    Failure to thrive: pt has difficulty completing ADLs & has had recent significant weight loss. Pt evidently survived the last 2 weeks on ice cream only. CM aware and will see the pt to potential resources    DVT prophylaxis: SCDs Code Status: full  Family Communication:  Disposition Plan: likely d/c back home   Consultants:   GI  Cardio    Procedures:    Antimicrobials:    Subjective: Pt c/o fatigue  Objective: Vitals:   11/24/19 1525 11/24/19 2035 11/25/19 0404 11/25/19 0712  BP: (!) 153/71 (!) 156/84 (!) 156/78 (!) 143/81  Pulse: (!) 109 (!) 57 62 (!) 57  Resp: 18 18 18 16   Temp: 98.2 F (36.8 C) 97.7 F (36.5 C) 98 F (36.7 C) 98.2 F (36.8 C)  TempSrc: Oral Oral  Oral  SpO2: 96% 100% 100% 98%  Weight:      Height:        Intake/Output Summary (Last 24 hours) at 11/25/2019 0739 Last data filed at  11/25/2019 0500 Gross per 24 hour  Intake 2992.58 ml  Output 1000 ml  Net 1992.58 ml   Filed Weights   11/23/19 1816 11/23/19 2325 11/24/19 0539  Weight: 63 kg 58.9 kg 58.9 kg    Examination:  General exam: Appears calm and comfortable  Respiratory system: diminished breath sounds b/l. Respiratory effort normal. No wheezes Cardiovascular system: S1 & S2+. No rubs, gallops or clicks. Gastrointestinal system: Abdomen is nondistended, soft and nontender. Hypoactive bowel sounds heard. Central nervous system: Alert and oriented. Moves all 4 extremities  Psychiatry: Judgement and insight appear normal. Mood & affect appropriate.     Data Reviewed: I have personally reviewed following labs and imaging studies  CBC: Recent Labs  Lab 11/23/19 1819 11/24/19 0513 11/24/19 0929 11/24/19 1534  WBC 8.1 7.3  --   --   HGB 7.4* 9.0* 9.1* 9.9*  HCT 22.0* 26.0* 26.1* 29.0*  MCV 99.5 92.9  --   --   PLT 213 156  --   --    Basic Metabolic Panel: Recent Labs  Lab 11/23/19 1819 11/24/19 0513  NA 141 142  K 3.5 3.8  CL 107 111  CO2 22 24  GLUCOSE 110* 99  BUN 15 15  CREATININE 0.81 0.79  CALCIUM 9.0 8.2*   GFR: Estimated Creatinine Clearance: 75.7 mL/min (by C-G formula based  on SCr of 0.79 mg/dL). Liver Function Tests: No results for input(s): AST, ALT, ALKPHOS, BILITOT, PROT, ALBUMIN in the last 168 hours. No results for input(s): LIPASE, AMYLASE in the last 168 hours. No results for input(s): AMMONIA in the last 168 hours. Coagulation Profile: No results for input(s): INR, PROTIME in the last 168 hours. Cardiac Enzymes: No results for input(s): CKTOTAL, CKMB, CKMBINDEX, TROPONINI in the last 168 hours. BNP (last 3 results) No results for input(s): PROBNP in the last 8760 hours. HbA1C: No results for input(s): HGBA1C in the last 72 hours. CBG: No results for input(s): GLUCAP in the last 168 hours. Lipid Profile: Recent Labs    11/24/19 0938  CHOL 119  HDL 19*    LDLCALC 80  TRIG 99  CHOLHDL 6.3   Thyroid Function Tests: Recent Labs    11/24/19 0938  TSH 2.366   Anemia Panel: Recent Labs    11/24/19 0512 11/24/19 0936  VITAMINB12 233  --   FOLATE  --  10.3   Sepsis Labs: No results for input(s): PROCALCITON, LATICACIDVEN in the last 168 hours.  Recent Results (from the past 240 hour(s))  SARS Coronavirus 2 by RT PCR (hospital order, performed in [facility] hospital lab) Nasopharyngeal Nasopharyngeal Swab     Status: None   Collection Time: 11/23/19  8:10 PM   Specimen: Nasopharyngeal Swab  Result Value Ref Range Status   SARS Coronavirus 2 NEGATIVE NEGATIVE Final    Comment: (NOTE) SARS-CoV-2 target nucleic acids are NOT DETECTED.  The SARS-CoV-2 RNA is generally detectable in upper and lower respiratory specimens during the acute phase of infection. The lowest concentration of SARS-CoV-2 viral copies this assay can detect is 250 copies / mL. A negative result does not preclude SARS-CoV-2 infection and should not be used as the sole basis for treatment or other patient management decisions.  A negative result may occur with improper specimen collection / handling, submission of specimen other than nasopharyngeal swab, presence of viral mutation(s) within the areas targeted by this assay, and inadequate number of viral copies (<250 copies / mL). A negative result must be combined with clinical observations, patient history, and epidemiological information.  Fact Sheet for Patients:   StrictlyIdeas.no  Fact Sheet for Healthcare Providers: BankingDealers.co.za  This test is not yet approved or  cleared by the [place] FDA and has been authorized for detection and/or diagnosis of SARS-CoV-2 by FDA under an Emergency Use Authorization (EUA).  This EUA will remain in effect (meaning this test can be used) for the duration of the COVID-19 declaration under Section 564(b)(1) of  the Act, 21 U.S.C. section 360bbb-3(b)(1), unless the authorization is terminated or revoked sooner.  Performed at [facility], [address] [place], [place] [address]          Radiology Studies: No results found.      Scheduled Meds: . metoprolol tartrate  25 mg Oral Daily  . traZODone  50 mg Oral QHS   Continuous Infusions: . sodium chloride 100 mL/hr at 11/24/19 2037  . sodium chloride    . pantoprozole (PROTONIX) infusion 8 mg/hr (11/24/19 2036)     LOS: 2 days    Time spent: 34 mins    Wyvonnia Dusky, MD [facility] Pager [phone]  If 7PM-7AM, please contact night-coverage [email or web address] 11/25/2019, 7:39 AM

## 2019-11-25 NOTE — Telephone Encounter (Signed)
Spoke with patient who is agreeable to have cath on 12/01/19.  He verbalized understanding of the instructions as listed below. Copy of instructions given to Lorenso Quarry, PA-C to take to patient on rounds over the weekend. Called scheduling and left message with information of the cath as it was after 4:30 pm. Called [facility] testing and patient scheduled for COVID test on Tuesday.   You are scheduled for a Cardiac Catheterization on Thursday, July 22 with Dr. Ida Rogue.  1. Please arrive at the [facility] at 7:30 AM (This time is one hour before your procedure to ensure your preparation). Free valet parking service is available.   Special note: Every effort is made to have your procedure done on time. Please understand that emergencies sometimes delay scheduled procedures.  2. Diet: Do not eat solid foods after midnight.  The patient may have clear liquids until 5am upon the day of the procedure.  3. COVID PRE- TEST: You will need a COVID TEST prior to the procedure:             LOCATION: [address] [facility] site.             DATE/TIME:  Tuesday, November 29, 2019 anytime between 8 am and 1 pm.   4. Medication instructions in preparation for your procedure:   Contrast Allergy: No  On the morning of your procedure, take your Aspirin and any morning medicines NOT listed above.  You may use sips of water.  5. Plan for one night stay--bring personal belongings. 6. Bring a current list of your medications and current insurance cards. 7. You MUST have a responsible person to drive you home. 8. Someone MUST be with you the first 24 hours after you arrive home or your discharge will be delayed. 9. Please wear clothes that are easy to get on and off and wear slip-on shoes.

## 2019-11-25 NOTE — Telephone Encounter (Signed)
-----   Message from Arvil Chaco, PA-C sent at 11/25/2019 11:01 AM EDT ----- Regarding: Outpatient Catheterization Greetings!  This patient is currently admitted in room 231.   He has severe Aortic stenosis.  Please schedule for outpatient left heart cardiac catheterization with Dr. Rockey Situ on Thursday or Friday of next week on 7/22 or 7/23.  Thank you!  JV

## 2019-11-25 NOTE — Progress Notes (Signed)
Vernon Patterson , MD [address] [place], [place], [place], [address] [address], [place], [place], [place], [address] Phone: [phone]  Fax: Welaka C Drew is being followed for melena  Day 1 of follow up   Subjective: Doing well no further melena    Objective: Vital signs in last 24 hours: Vitals:   11/24/19 1525 11/24/19 2035 11/25/19 0404 11/25/19 0712  BP: (!) 153/71 (!) 156/84 (!) 156/78 (!) 143/81  Pulse: (!) 109 (!) 57 62 (!) 57  Resp: 18 18 18 16   Temp: 98.2 F (36.8 C) 97.7 F (36.5 C) 98 F (36.7 C) 98.2 F (36.8 C)  TempSrc: Oral Oral  Oral  SpO2: 96% 100% 100% 98%  Weight:      Height:       Weight change:   Intake/Output Summary (Last 24 hours) at 11/25/2019 6073 Last data filed at 11/25/2019 0500 Gross per 24 hour  Intake 2436.92 ml  Output 800 ml  Net 1636.92 ml     Exam: Ax 0 x 3 not in any pain or distress   Lab Results: @LABTEST2 @ Micro Results: Recent Results (from the past 240 hour(s))  SARS Coronavirus 2 by RT PCR (hospital order, performed in [place] hospital lab) Nasopharyngeal Nasopharyngeal Swab     Status: None   Collection Time: 11/23/19  8:10 PM   Specimen: Nasopharyngeal Swab  Result Value Ref Range Status   SARS Coronavirus 2 NEGATIVE NEGATIVE Final    Comment: (NOTE) SARS-CoV-2 target nucleic acids are NOT DETECTED.  The SARS-CoV-2 RNA is generally detectable in upper and lower respiratory specimens during the acute phase of infection. The lowest concentration of SARS-CoV-2 viral copies this assay can detect is 250 copies / mL. A negative result does not preclude SARS-CoV-2 infection and should not be used as the sole basis for treatment or other patient management decisions.  A negative result may occur with improper specimen collection / handling, submission of specimen other than nasopharyngeal swab, presence of viral mutation(s) within the areas targeted by this assay, and inadequate number  of viral copies (<250 copies / mL). A negative result must be combined with clinical observations, patient history, and epidemiological information.  Fact Sheet for Patients:   StrictlyIdeas.no  Fact Sheet for Healthcare Providers: BankingDealers.co.za  This test is not yet approved or  cleared by the [place] FDA and has been authorized for detection and/or diagnosis of SARS-CoV-2 by FDA under an Emergency Use Authorization (EUA).  This EUA will remain in effect (meaning this test can be used) for the duration of the COVID-19 declaration under Section 564(b)(1) of the Act, 21 U.S.C. section 360bbb-3(b)(1), unless the authorization is terminated or revoked sooner.  Performed at [facility], [address], [place], [place] [address]    Studies/Results: No results found. Medications: I have reviewed the patient's current medications. Scheduled Meds: . metoprolol tartrate  25 mg Oral Daily  . traZODone  50 mg Oral QHS   Continuous Infusions: . sodium chloride 100 mL/hr at 11/24/19 2037  . sodium chloride    . pantoprozole (PROTONIX) infusion 8 mg/hr (11/24/19 2036)   PRN Meds:.acetaminophen **OR** acetaminophen, ondansetron **OR** ondansetron (ZOFRAN) IV, zolpidem   Assessment: Active Problems:   GI bleeding  Vernon Patterson is a 64 y.o. y/o male admitted with fatigue, noted to be anemic, recent history of 1 episode of blood clot in  Vomitus and melena.  Hemoglobin stable.  B12 low.  Folate normal  Plan  1. IV PPI 2.  EGD was initially planned for today but due to the water contamination with E. coli will have to be postponed to tomorrow.  The patient has been seen by Dr. Rockey Situ and has been noted to have severe aortic stenosis.  The plan is for an outpatient cath next week followed by possible TAVR procedure.  I have discussed the patient with Dr. Ronelle Nigh obviously the patient is higher risk than usual due to  the aortic stenosis but probably would have to be done due to history of melena and hematemesis. 3. Monitor CBC and transfuse  4.  He has a low B12 level at 233.  Suggest oral B12 1000 mcg a day.  Recheck in a few weeks if not improving will need parenteral shots      LOS: 2 days   Vernon Bellows, MD 11/25/2019, 9:25 AM

## 2019-11-25 NOTE — Progress Notes (Signed)
*  PRELIMINARY RESULTS* Echocardiogram 2D Echocardiogram has been performed.  Vernon Patterson 11/25/2019, 9:01 AM

## 2019-11-25 NOTE — Progress Notes (Signed)
Progress Note  Patient Name: Vernon Patterson Date of Encounter: 11/25/2019  [facility] Cardiologist: Arvid Right  Subjective   No shortness of breath or chest pain, feels comfortable Would like to go home Echocardiogram during rounds, severe aortic valve stenosis noted  Inpatient Medications    Scheduled Meds: . metoprolol tartrate  25 mg Oral Daily  . traZODone  50 mg Oral QHS  . vitamin B-12  1,000 mcg Oral Daily   Continuous Infusions: . sodium chloride 100 mL/hr at 11/24/19 2037  . sodium chloride    . pantoprozole (PROTONIX) infusion 8 mg/hr (11/24/19 2036)   PRN Meds: acetaminophen **OR** acetaminophen, ondansetron **OR** ondansetron (ZOFRAN) IV, zolpidem   Vital Signs    Vitals:   11/24/19 2035 11/25/19 0404 11/25/19 0712 11/25/19 1106  BP: (!) 156/84 (!) 156/78 (!) 143/81 (!) 150/85  Pulse: (!) 57 62 (!) 57 (!) 57  Resp: 18 18 16 17   Temp: 97.7 F (36.5 C) 98 F (36.7 C) 98.2 F (36.8 C) 98.3 F (36.8 C)  TempSrc: Oral  Oral Oral  SpO2: 100% 100% 98% 100%  Weight:      Height:        Intake/Output Summary (Last 24 hours) at 11/25/2019 1224 Last data filed at 11/25/2019 1111 Gross per 24 hour  Intake 2436.92 ml  Output 475 ml  Net 1961.92 ml   Last 3 Weights 11/24/2019 11/23/2019 11/23/2019  Weight (lbs) 129 lb 14.4 oz 129 lb 14.4 oz 139 lb  Weight (kg) 58.922 kg 58.922 kg 63.05 kg      Telemetry    Normal sinus rhythm- Personally Reviewed  ECG     - Personally Reviewed  Physical Exam   GEN: No acute distress.  Thin Neck: No JVD Cardiac: RRR, 3/6 SEM right sternal border extending to the left No rubs, or gallops.  Respiratory: Clear to auscultation bilaterally. GI: Soft, nontender, non-distended  MS: No edema; No deformity. Neuro:  Nonfocal  Psych: Normal affect   Labs    High Sensitivity Troponin:   Recent Labs  Lab 11/23/19 1819 11/23/19 2010  TROPONINIHS 75* 70*      Chemistry Recent Labs  Lab 11/23/19 1819  11/24/19 0513  NA 141 142  K 3.5 3.8  CL 107 111  CO2 22 24  GLUCOSE 110* 99  BUN 15 15  CREATININE 0.81 0.79  CALCIUM 9.0 8.2*  GFRNONAA >60 >60  GFRAA >60 >60  ANIONGAP 12 7     Hematology Recent Labs  Lab 11/23/19 1819 11/23/19 1819 11/24/19 0513 11/24/19 0929 11/24/19 1534  WBC 8.1  --  7.3  --   --   RBC 2.21*  --  2.80*  --   --   HGB 7.4*   < > 9.0* 9.1* 9.9*  HCT 22.0*   < > 26.0* 26.1* 29.0*  MCV 99.5  --  92.9  --   --   MCH 33.5  --  32.1  --   --   MCHC 33.6  --  34.6  --   --   RDW 13.2  --  16.7*  --   --   PLT 213  --  156  --   --    < > = values in this interval not displayed.    BNPNo results for input(s): BNP, PROBNP in the last 168 hours.   DDimer No results for input(s): DDIMER in the last 168 hours.   Radiology    No results found.  Cardiac Studies   Echocardiogram Normal ejection fraction, severe aortic valve stenosis, peak gradient 4 m/s  Patient Profile     Mr. Lamison is a 64 year old gentleman with history of alcohol abuse, CAD, hx of CABG in 2014 in [place], smoker, presenting to the hospital November 23, 2019 with weakness, fatigue, weight loss, anorexia Note indicating he is scheduled for EGD, preop cardiovascular evaluation requested  Assessment & Plan    Preop EGD Known coronary disease, prior bypass surgery Denies anginal symptoms but is sedentary Nonspecific EKG changes, no recent EKGs available over the past several years -Echocardiogram and severe aortic valve stenosis -Plan for EGD tomorrow  Severe protein calorie malnutrition Dramatic weight loss as above, etiology unclear May need social worker to ensure no food insecurity at home  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  Would likely be a poor candidate for open heart surgery given dramatic weight loss, debility though could reconsider if anorexia improves  Anemia Likely multifactorial, in the setting of anemia of chronic disease, protein calorie malnutrition Need social worker, discussion of food insecurity Daily alcohol like playing a role   Total encounter time more than 35 minutes  Greater than 50% was spent in counseling and coordination of care with the patient   For questions or updates, please contact [place] Please consult [email or web address] for contact info under        Signed, Ida Rogue, MD  11/25/2019, 12:24 PM

## 2019-11-25 NOTE — Progress Notes (Signed)
GI notified via text. EGD cancelled 2/2 to water contamination. MD orders soft diet and NPO after midnight. I will continue to assess.

## 2019-11-26 ENCOUNTER — Encounter
Admission: EM | Disposition: A | Discharge status: Home / Self Care | Payer: Self-pay | Source: Home / Self Care | Attending: Internal Medicine

## 2019-11-26 ENCOUNTER — Other Ambulatory Visit: Payer: Self-pay | Admitting: Physician Assistant

## 2019-11-26 ENCOUNTER — Encounter: Payer: Self-pay | Admitting: Family Medicine

## 2019-11-26 ENCOUNTER — Inpatient Hospital Stay: Payer: Medicare Other | Admitting: Anesthesiology

## 2019-11-26 ENCOUNTER — Inpatient Hospital Stay: Payer: Medicare Other

## 2019-11-26 DIAGNOSIS — D490 Neoplasm of unspecified behavior of digestive system: Secondary | ICD-10-CM

## 2019-11-26 DIAGNOSIS — R05 Cough: Secondary | ICD-10-CM

## 2019-11-26 DIAGNOSIS — E785 Hyperlipidemia, unspecified: Secondary | ICD-10-CM

## 2019-11-26 DIAGNOSIS — R059 Cough, unspecified: Secondary | ICD-10-CM

## 2019-11-26 DIAGNOSIS — Z720 Tobacco use: Secondary | ICD-10-CM

## 2019-11-26 DIAGNOSIS — E876 Hypokalemia: Secondary | ICD-10-CM

## 2019-11-26 DIAGNOSIS — R1310 Dysphagia, unspecified: Secondary | ICD-10-CM

## 2019-11-26 DIAGNOSIS — K222 Esophageal obstruction: Secondary | ICD-10-CM

## 2019-11-26 DIAGNOSIS — K921 Melena: Secondary | ICD-10-CM

## 2019-11-26 DIAGNOSIS — J392 Other diseases of pharynx: Secondary | ICD-10-CM

## 2019-11-26 HISTORY — PX: ESOPHAGOGASTRODUODENOSCOPY (EGD) WITH PROPOFOL: SHX5813

## 2019-11-26 LAB — BASIC METABOLIC PANEL
Anion gap: 5 (ref 5–15)
BUN: 9 mg/dL (ref 8–23)
CO2: 21 mmol/L — ABNORMAL LOW (ref 22–32)
Calcium: 7.8 mg/dL — ABNORMAL LOW (ref 8.9–10.3)
Chloride: 113 mmol/L — ABNORMAL HIGH (ref 98–111)
Creatinine, Ser: 0.61 mg/dL (ref 0.61–1.24)
GFR calc Af Amer: >60 mL/min (ref >60)
GFR calc non Af Amer: >60 mL/min (ref >60)
Glucose, Bld: 99 mg/dL (ref 70–99)
Potassium: 2.8 mmol/L — ABNORMAL LOW (ref 3.5–5.1)
Sodium: 139 mmol/L (ref 135–145)

## 2019-11-26 LAB — CBC
HCT: 27.1 % — ABNORMAL LOW (ref 39.0–52.0)
Hemoglobin: 9 g/dL — ABNORMAL LOW (ref 13.0–17.0)
MCH: 31.5 pg (ref 26.0–34.0)
MCHC: 33.2 g/dL (ref 30.0–36.0)
MCV: 94.8 fL (ref 80.0–100.0)
Platelets: 164 10*3/uL (ref 150–400)
RBC: 2.86 MIL/uL — ABNORMAL LOW (ref 4.22–5.81)
RDW: 17.7 % — ABNORMAL HIGH (ref 11.5–15.5)
WBC: 7.3 10*3/uL (ref 4.0–10.5)
nRBC: 0 % (ref 0.0–0.2)

## 2019-11-26 LAB — HEPATIC FUNCTION PANEL
ALT: 13 U/L (ref 0–44)
AST: 21 U/L (ref 15–41)
Albumin: 2.2 g/dL — ABNORMAL LOW (ref 3.5–5.0)
Alkaline Phosphatase: 40 U/L (ref 38–126)
Bilirubin, Direct: 0.3 mg/dL — ABNORMAL HIGH (ref 0.0–0.2)
Indirect Bilirubin: 0.6 mg/dL (ref 0.3–0.9)
Total Bilirubin: 0.9 mg/dL (ref 0.3–1.2)
Total Protein: 5 g/dL — ABNORMAL LOW (ref 6.5–8.1)

## 2019-11-26 LAB — IRON AND TIBC
Iron: 23 ug/dL — ABNORMAL LOW (ref 45–182)
Saturation Ratios: 16 % — ABNORMAL LOW (ref 17.9–39.5)
TIBC: 147 ug/dL — ABNORMAL LOW (ref 250–450)
UIBC: 124 ug/dL

## 2019-11-26 LAB — FERRITIN: Ferritin: 229 ng/mL (ref 24–336)

## 2019-11-26 LAB — VITAMIN B12: Vitamin B-12: 299 pg/mL (ref 180–914)

## 2019-11-26 LAB — RETIC PANEL
Immature Retic Fract: 25.4 % — ABNORMAL HIGH (ref 2.3–15.9)
RBC.: 2.81 MIL/uL — ABNORMAL LOW (ref 4.22–5.81)
Retic Count, Absolute: 92.4 10*3/uL (ref 19.0–186.0)
Retic Ct Pct: 3.3 % — ABNORMAL HIGH (ref 0.4–3.1)
Reticulocyte Hemoglobin: 35.4 pg (ref >27.9)

## 2019-11-26 LAB — MAGNESIUM: Magnesium: 1.6 mg/dL — ABNORMAL LOW (ref 1.7–2.4)

## 2019-11-26 SURGERY — ESOPHAGOGASTRODUODENOSCOPY (EGD) WITH PROPOFOL
Anesthesia: Monitor Anesthesia Care

## 2019-11-26 SURGERY — ESOPHAGOGASTRODUODENOSCOPY (EGD) WITH PROPOFOL
Anesthesia: General

## 2019-11-26 MED ORDER — PROPOFOL 500 MG/50ML IV EMUL
INTRAVENOUS | Status: DC | PRN
  Administered 2019-11-26: 200 ug/kg/min via INTRAVENOUS
  Administered 2019-11-26: 30 mg via INTRAVENOUS

## 2019-11-26 MED ORDER — MAGNESIUM SULFATE 2 GM/50ML IV SOLN
2.0000 g | Freq: Once | INTRAVENOUS | Status: AC
  Administered 2019-11-26: 2 g via INTRAVENOUS
  Filled 2019-11-26: qty 50

## 2019-11-26 MED ORDER — ADULT MULTIVITAMIN W/MINERALS CH
1.0000 | ORAL_TABLET | Freq: Every day | ORAL | Status: DC
  Administered 2019-11-27 – 2019-11-28 (×2): 1 via ORAL
  Filled 2019-11-26 (×2): qty 1

## 2019-11-26 MED ORDER — PHENYLEPHRINE HCL (PRESSORS) 10 MG/ML IV SOLN
INTRAVENOUS | Status: DC | PRN
  Administered 2019-11-26: 100 ug via INTRAVENOUS
  Administered 2019-11-26 (×11): 40 ug via INTRAVENOUS

## 2019-11-26 MED ORDER — PROPOFOL 500 MG/50ML IV EMUL
INTRAVENOUS | Status: AC
  Filled 2019-11-26: qty 50

## 2019-11-26 MED ORDER — LIDOCAINE HCL (PF) 2 % IJ SOLN
INTRAMUSCULAR | Status: AC
  Filled 2019-11-26: qty 5

## 2019-11-26 MED ORDER — LIDOCAINE HCL (CARDIAC) PF 100 MG/5ML IV SOSY
PREFILLED_SYRINGE | INTRAVENOUS | Status: DC | PRN
  Administered 2019-11-26: 140 mg via INTRAVENOUS

## 2019-11-26 MED ORDER — SODIUM CHLORIDE 0.9% FLUSH
3.0000 mL | Freq: Two times a day (BID) | INTRAVENOUS | Status: AC
  Filled 2019-11-26: qty 3

## 2019-11-26 NOTE — Consult Note (Signed)
Sian, Rj [id number] Sep 17, 1955 Wyvonnia Dusky, MD  Reason for Consult: Evaluate for right piriform sinus mass found on EGD  HPI: The patient is a 64 year old white male who has had significant medical issues including coronary artery disease, hypertension, depression and anxiety, who had had several days of fatigue, loss of appetite, dark stools, and nausea and vomiting.  He was admitted to the hospital 3 days ago and was found to have a hemoglobin of 7.4 with positive blood in his stool.  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  This was biopsied.  This is also causing some esophageal stenosis.  A second lesion was noted in the submucosa of the left piriform sinus but not obstructing the airway.  Consultation is placed for evaluation of this for further assessment.  The patient drinks alcohol daily and smokes a cigar every day as well.  He denies having any pain in his throat until this morning after his EGD it feels a little scratchy.  Allergies: No Known Allergies  ROS: Review of systems normal other than 12 systems except per HPI.  PMH:  Past Medical History:  Diagnosis Date  . Anxiety   . Coronary artery disease   . Depression   . Heart murmur   . Hypertension     FH: History reviewed. No pertinent family history.  SH:  Social History   Socioeconomic History  . Marital status: Married    Spouse name: Not on file  . Number of children: Not on file  . Years of education: Not on file  . Highest education level: Not on file  Occupational History  . Not on file  Tobacco Use  . Smoking status: Current Every Day Smoker    Types: Cigars  . Smokeless tobacco: Never Used  . Tobacco comment: 2-3 cigars/day  Substance and Sexual Activity  . Alcohol use: Yes    Comment: 5 bottles of wine/week  . Drug use: No  . Sexual activity: Never  Other Topics Concern  . Not on file  Social History Narrative  . Not on file    Social Determinants of Health   Financial Resource Strain:   . Difficulty of Paying Living Expenses:   Food Insecurity:   . Worried About Charity fundraiser in the Last Year:   . Arboriculturist in the Last Year:   Transportation Needs:   . Film/video editor (Medical):   Marland Kitchen Lack of Transportation (Non-Medical):   Physical Activity:   . Days of Exercise per Week:   . Minutes of Exercise per Session:   Stress:   . Feeling of Stress :   Social Connections:   . Frequency of Communication with Friends and Family:   . Frequency of Social Gatherings with Friends and Family:   . Attends Religious Services:   . Active Member of Clubs or Organizations:   . Attends Archivist Meetings:   Marland Kitchen Marital Status:   Intimate Partner Violence:   . Fear of Current or Ex-Partner:   . Emotionally Abused:   Marland Kitchen Physically Abused:   . Sexually Abused:     PSH:  Past Surgical History:  Procedure Laterality Date  . CARDIAC CATHETERIZATION    . CORONARY ARTERY BYPASS GRAFT    . CORONARY ARTERY BYPASS GRAFT N/A 04/18/2013   Procedure: CORONARY ARTERY BYPASS GRAFTING (CABG);  Surgeon: Gaye Pollack, MD;  Location: [facility];  Service: Open  Heart Surgery;  Laterality: N/A;  Times  3  using left internal mammary artery and endoscopically harvested right saphenous vein  . ENDOVEIN HARVEST OF GREATER SAPHENOUS VEIN Right 04/18/2013   Procedure: ENDOVEIN HARVEST OF GREATER SAPHENOUS VEIN;  Surgeon: Gaye Pollack, MD;  Location: [facility];  Service: Open Heart Surgery;  Laterality: Right;  Some of the saphenous vein from the lower leg also used.  . INTRAOPERATIVE TRANSESOPHAGEAL ECHOCARDIOGRAM N/A 04/18/2013   Procedure: INTRAOPERATIVE TRANSESOPHAGEAL ECHOCARDIOGRAM;  Surgeon: Gaye Pollack, MD;  Location: [facility];  Service: Open Heart Surgery;  Laterality: N/A;    Physical  Exam: The patient has a clear oropharynx with no oral lesions.  His posterior pharynx appears to be clear.  His neck is negative  for any nodes or masses.  He is relatively thin and you can feel his landmarks well.  I do not have a clean scope to be able to look at his larynx today.   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  There is some stenosis of bleeding from an ulcerative lesion.  Biopsies were taken and we are waiting the path report of this.  A lesion was also noted in the left piriform sinus that is small and submucosal with a little ulceration.  This is asymptomatic for him may represent a contiguous primary. He will need to be discharged to get his PET scan done.  I will try to make arrangements for him to get an office visit when he is discharged so that we can put a scope in his larynx and visualize this area better to get some assessment of size and likely pathology.  By then we should have the pathology report back from the original biopsy and the PET scan to try to make decision about whether we need to biopsy this area or not.  Biopsy will require a small outpatient procedure but under general anesthesia.  If needed to be done we will try to arrange it as soon as possible.   Elon Alas Cintia Gleed 11/26/2019 6:54 PM

## 2019-11-26 NOTE — Anesthesia Postprocedure Evaluation (Addendum)
Anesthesia Post Note  Patient: Vernon Patterson  Procedure(s) Performed: ESOPHAGOGASTRODUODENOSCOPY (EGD) WITH PROPOFOL (N/A )  Patient location during evaluation: Endoscopy Anesthesia Type: General Level of consciousness: awake and alert Pain management: pain level controlled Vital Signs Assessment: post-procedure vital signs reviewed and stable Respiratory status: spontaneous breathing, nonlabored ventilation, respiratory function stable and patient connected to nasal cannula oxygen Cardiovascular status: blood pressure returned to baseline and stable Postop Assessment: no apparent nausea or vomiting Anesthetic complications: no   No complications documented.   Last Vitals:  Vitals:   11/26/19 1314 11/26/19 1528  BP: (!) 143/73 139/84  Pulse: (!) 51 (!) 52  Resp: 18 20  Temp:  36.7 C  SpO2: 100% 100%    Last Pain:  Vitals:   11/26/19 1528  TempSrc: Oral  PainSc:                  Precious Haws Chanise Habeck

## 2019-11-26 NOTE — Progress Notes (Signed)
Initial Nutrition Assessment  DOCUMENTATION CODES:   Not applicable  INTERVENTION:   Ensure Enlive po TID, each supplement provides 350 kcal and 20 grams of protein  Depending on poc, pt may ultimately benefit from permanent feeding tube placement for sole source vs supplemental nutrition given possible new dx of GI cancer  Add MVI with Minerals   NUTRITION DIAGNOSIS:   Inadequate oral intake related to cancer and cancer related treatments, decreased appetite, dysphagia as evidenced by per patient/family report.  GOAL:   Patient will meet greater than or equal to 90% of their needs  MONITOR:   PO intake, Diet advancement, Supplement acceptance, Labs, Weight trends  REASON FOR ASSESSMENT:   Consult Poor PO  ASSESSMENT:   64 yo male with iron def anemia secondary to chronic blood loss, dysphagia and found to have possible malignant masses in stomach, esophagus and pyriform sinus. PMH includes EtOH abuse   RD working remotely. Unable to reach patient via telephone.   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 Pt has also been experiencing weakness and difficulty performing ADLs   PO intake on CL diet on 7/15 75-90%  Current wt 64.9 kg. No recent weight encounters; last previous weight from 2016 (70 kg), Weight of 80 kg in 2015.   Pt is at VERY HIGH nutritional risk. Pt very likely meets clinical characteristics for malnutrition but not enough information at this time to make recommendation for diagnosis. Needs Nutrition focused physical exam on follow-up  Labs: potassium 2.8 (L) Meds: NS at 100 ml/hr, B-12   NUTRITION - FOCUSED  PHYSICAL EXAM:  Deferred  Diet Order:   Diet Order            Diet clear liquid Room service appropriate? Yes; Fluid consistency: Thin  Diet effective now                 EDUCATION NEEDS:   Not appropriate for education at this time  Skin:  Skin Assessment: Reviewed RN Assessment  Last BM:  7/14  Height:   Ht Readings from Last 1 Encounters:  11/26/19 5\' 8"  (1.727 m)    Weight:   Wt Readings from Last 1 Encounters:  11/26/19 64.9 kg     BMI:  Body mass index is 21.74 kg/m.  Estimated Nutritional Needs:   Kcal:  2200-2400 kcals  Protein:  110-120 g  Fluid:  >/= 2 L  Kerman Passey MS, RDN, LDN, CNSC Registered Dietitian III Clinical Nutrition RD Pager and On-Julin Pager Number Located in [place]

## 2019-11-26 NOTE — Progress Notes (Signed)
PROGRESS NOTE    Vernon Patterson  HCW:[id number] DOB: 1955-11-27 DOA: 11/23/2019 PCP: Vernon Billet, MD   Assessment & Plan:   Active Problems:   GI bleeding  GI bleeding: etiology unclear.  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 Continue on IV protonix. Fluid liquid diet as per GI. Continue to hold aspirin. GI following and recs apprec.  Will continue to monitor H&H   Alcohol abuse: alcohol cessation counseling. Pt states if it "makes me feel good, I'll continue to drink it"   Severe aortic stenosis: as per echo. Will need cardiac cath & likely TAVR which was discussed w/ pt by cardio. Work-up will continue as an outpatient   Elevated troponins: no chest pain. Trending down. Likely due to demand ischemia   Acute blood loss anemia: due to GI bleeding as well as chronic normocytic anemia.  S/p 2 units of pRBCs. Will continue to monitor H&H  Hypertension: continue on metoprolol   Severe protein calorie nutrition: will continue ensure.  Nutrition consulted   Failure to thrive: likely secondary to possible malignancy. Pt has difficulty completing ADLs & has had recent significant weight loss. Pt evidently survived the last 2 weeks on ice cream only. CM aware and will see the pt to potential resources.   DVT prophylaxis: SCDs Code Status: full  Family Communication:  Disposition Plan: likely Patterson/c back home   Consultants:   GI  Cardio    Procedures:    Antimicrobials:    Subjective: Pt c/o fatigue  Objective: Vitals:   11/25/19 1106 11/25/19 1546 11/25/19 1916 11/26/19 0521  BP: (!) 150/85 120/71 112/75 114/71  Pulse: (!) 57 (!) 58 (!) 57 (!) 58  Resp: 17 16 20 20   Temp: 98.3 F (36.8 C) 97.9 F (36.6 C) 97.6 F (36.4 C) 98.5 F (36.9 C)  TempSrc: Oral  Oral   SpO2: 100% 100% 100% 100%  Weight:    64.9 kg  Height:        Intake/Output Summary (Last 24 hours) at  11/26/2019 0804 Last data filed at 11/26/2019 0531 Gross per 24 hour  Intake --  Output 250 ml  Net -250 ml   Filed Weights   11/23/19 2325 11/24/19 0539 11/26/19 0521  Weight: 58.9 kg 58.9 kg 64.9 kg    Examination:  General exam: Appears calm and comfortable. Disheveled  Respiratory system: decreased breath sounds b/l. No wheezes Cardiovascular system: S1 & S2+. No rubs, gallops or clicks. Gastrointestinal system: Abdomen is nondistended, soft and nontender. Hypoactive bowel sounds heard. Central nervous system: Alert and oriented. Moves all 4 extremities  Psychiatry: Judgement and insight appear normal. Mood & affect appropriate.     Data Reviewed: I have personally reviewed following labs and imaging studies  CBC: Recent Labs  Lab 11/23/19 1819 11/24/19 0513 11/24/19 0929 11/24/19 1534  WBC 8.1 7.3  --   --   HGB 7.4* 9.0* 9.1* 9.9*  HCT 22.0* 26.0* 26.1* 29.0*  MCV 99.5 92.9  --   --   PLT 213 156  --   --    Basic Metabolic Panel: Recent Labs  Lab 11/23/19 1819 11/24/19 0513  NA 141 142  K 3.5 3.8  CL 107 111  CO2 22 24  GLUCOSE 110* 99  BUN 15 15  CREATININE 0.81 0.79  CALCIUM 9.0 8.2*   GFR: Estimated Creatinine Clearance: 83.4 mL/min (by C-G formula based on SCr of  0.79 mg/dL). Liver Function Tests: No results for input(s): AST, ALT, ALKPHOS, BILITOT, PROT, ALBUMIN in the last 168 hours. No results for input(s): LIPASE, AMYLASE in the last 168 hours. No results for input(s): AMMONIA in the last 168 hours. Coagulation Profile: No results for input(s): INR, PROTIME in the last 168 hours. Cardiac Enzymes: No results for input(s): CKTOTAL, CKMB, CKMBINDEX, TROPONINI in the last 168 hours. BNP (last 3 results) No results for input(s): PROBNP in the last 8760 hours. HbA1C: Recent Labs    11/24/19 1534  HGBA1C 5.1   CBG: No results for input(s): GLUCAP in the last 168 hours. Lipid Profile: Recent Labs    11/24/19 0938  CHOL 119  HDL 19*   LDLCALC 80  TRIG 99  CHOLHDL 6.3   Thyroid Function Tests: Recent Labs    11/24/19 0938  TSH 2.366   Anemia Panel: Recent Labs    11/24/19 0512 11/24/19 0936  VITAMINB12 233  --   FOLATE  --  10.3   Sepsis Labs: No results for input(s): PROCALCITON, LATICACIDVEN in the last 168 hours.  Recent Results (from the past 240 hour(s))  SARS Coronavirus 2 by RT PCR (hospital order, performed in [facility] hospital lab) Nasopharyngeal Nasopharyngeal Swab     Status: None   Collection Time: 11/23/19  8:10 PM   Specimen: Nasopharyngeal Swab  Result Value Ref Range Status   SARS Coronavirus 2 NEGATIVE NEGATIVE Final    Comment: (NOTE) SARS-CoV-2 target nucleic acids are NOT DETECTED.  The SARS-CoV-2 RNA is generally detectable in upper and lower respiratory specimens during the acute phase of infection. The lowest concentration of SARS-CoV-2 viral copies this assay can detect is 250 copies / mL. A negative result does not preclude SARS-CoV-2 infection and should not be used as the sole basis for treatment or other patient management decisions.  A negative result may occur with improper specimen collection / handling, submission of specimen other than nasopharyngeal swab, presence of viral mutation(s) within the areas targeted by this assay, and inadequate number of viral copies (<250 copies / mL). A negative result must be combined with clinical observations, patient history, and epidemiological information.  Fact Sheet for Patients:   StrictlyIdeas.no  Fact Sheet for Healthcare Providers: BankingDealers.co.za  This test is not yet approved or  cleared by the [place] FDA and has been authorized for detection and/or diagnosis of SARS-CoV-2 by FDA under an Emergency Use Authorization (EUA).  This EUA will remain in effect (meaning this test can be used) for the duration of the COVID-19 declaration under Section 564(b)(1) of  the Act, 21 U.S.C. section 360bbb-3(b)(1), unless the authorization is terminated or revoked sooner.  Performed at [facility], [address] [place], [place] [address]          Radiology Studies: ECHOCARDIOGRAM COMPLETE  Result Date: 11/25/2019    ECHOCARDIOGRAM REPORT   Patient Name:   Vernon LAVELL Date of Exam: 11/25/2019 Medical Rec #:  [id number]      Height:       68.0 in Accession #:    [id number]     Weight:       129.9 lb Date of Birth:  May 02, 1956      BSA:          1.701 m Patient Age:    1 years       BP:           143/81 mmHg Patient Gender: M  HR:           58 bpm. Exam Location:  [facility] Procedure: 2D Echo, Color Doppler and Cardiac Doppler Indications:     R01.2 Abnormal Heart Sounds Nec  History:         Patient has no prior history of Echocardiogram examinations.                  CAD; Prior CABG.  Sonographer:     Charmayne Sheer RDCS (AE) Referring Phys:  [id number] Arvil Chaco Diagnosing Phys: Ida Rogue MD IMPRESSIONS  1. Left ventricular ejection fraction, by estimation, is 60 to 65%. The left ventricle has normal function. The left ventricle has no regional wall motion abnormalities. Left ventricular diastolic parameters are consistent with Grade II diastolic dysfunction (pseudonormalization).  2. Right ventricular systolic function is normal. The right ventricular size is normal. There is mildly elevated pulmonary artery systolic pressure.  3. Left atrial size was mildly dilated.  4. The aortic valve is heavily calcified. Aortic valve regurgitation is mild. Severe aortic valve stenosis. Aortic valve area, by VTI measures 0.86 cm. Aortic valve mean gradient measures 38.0 mmHg. Aortic valve Vmax measures 4.02 m/s. FINDINGS  Left Ventricle: Left ventricular ejection fraction, by estimation, is 60 to 65%. The left ventricle has normal function. The left ventricle has no regional wall motion abnormalities. The left ventricular internal cavity size was  normal in size. There is  no left ventricular hypertrophy. Left ventricular diastolic parameters are consistent with Grade II diastolic dysfunction (pseudonormalization). Right Ventricle: The right ventricular size is normal. No increase in right ventricular wall thickness. Right ventricular systolic function is normal. There is mildly elevated pulmonary artery systolic pressure. The tricuspid regurgitant velocity is 2.53  m/s, and with an assumed right atrial pressure of 10 mmHg, the estimated right ventricular systolic pressure is 10.2 mmHg. Left Atrium: Left atrial size was mildly dilated. Right Atrium: Right atrial size was normal in size. Pericardium: There is no evidence of pericardial effusion. Mitral Valve: The mitral valve is normal in structure. There is mild thickening of the mitral valve leaflet(s). Normal mobility of the mitral valve leaflets. Mild mitral valve regurgitation. No evidence of mitral valve stenosis. MV peak gradient, 4.2 mmHg. The mean mitral valve gradient is 2.0 mmHg. Tricuspid Valve: The tricuspid valve is normal in structure. Tricuspid valve regurgitation is not demonstrated. No evidence of tricuspid stenosis. Aortic Valve: The aortic valve is normal in structure. Aortic valve regurgitation is mild. Severe aortic stenosis is present. Aortic valve mean gradient measures 38.0 mmHg. Aortic valve peak gradient measures 64.6 mmHg. Aortic valve area, by VTI measures  0.86 cm. Pulmonic Valve: The pulmonic valve was normal in structure. Pulmonic valve regurgitation is not visualized. No evidence of pulmonic stenosis. Aorta: The aortic root is normal in size and structure. Venous: The inferior vena cava is normal in size with greater than 50% respiratory variability, suggesting right atrial pressure of 3 mmHg. IAS/Shunts: No atrial level shunt detected by color flow Doppler.  LEFT VENTRICLE PLAX 2D LVIDd:         3.94 cm  Diastology LVIDs:         2.13 cm  LV e' lateral:   9.90 cm/s LV PW:          1.26 cm  LV E/e' lateral: 10.4 LV IVS:        1.13 cm  LV e' medial:    5.22 cm/s LVOT diam:     2.40 cm  LV  E/e' medial:  19.7 LV SV:         89 LV SV Index:   52 LVOT Area:     4.52 cm  RIGHT VENTRICLE RV Basal diam:  3.15 cm LEFT ATRIUM             Index       RIGHT ATRIUM           Index LA diam:        4.20 cm 2.47 cm/m  RA Area:     12.70 cm LA Vol (A2C):   62.5 ml 36.74 ml/m RA Volume:   27.80 ml  16.34 ml/m LA Vol (A4C):   48.2 ml 28.33 ml/m LA Biplane Vol: 55.2 ml 32.45 ml/m  AORTIC VALVE                    PULMONIC VALVE AV Area (Vmax):    0.82 cm     PV Vmax:       2.01 m/s AV Area (Vmean):   0.95 cm     PV Vmean:      135.000 cm/s AV Area (VTI):     0.86 cm     PV VTI:        0.470 m AV Vmax:           402.00 cm/s  PV Peak grad:  16.2 mmHg AV Vmean:          247.500 cm/s PV Mean grad:  9.0 mmHg AV VTI:            1.040 m AV Peak Grad:      64.6 mmHg AV Mean Grad:      38.0 mmHg LVOT Vmax:         72.50 cm/s LVOT Vmean:        51.900 cm/s LVOT VTI:          0.197 m LVOT/AV VTI ratio: 0.19  AORTA Ao Root diam: 3.80 cm MITRAL VALVE                TRICUSPID VALVE MV Area (PHT): 4.04 cm     TR Peak grad:   25.6 mmHg MV Peak grad:  4.2 mmHg     TR Vmax:        253.00 cm/s MV Mean grad:  2.0 mmHg MV Vmax:       1.02 m/s     SHUNTS MV Vmean:      58.1 cm/s    Systemic VTI:  0.20 m MV Decel Time: 188 msec     Systemic Diam: 2.40 cm MV E velocity: 103.00 cm/s MV A velocity: 45.00 cm/s MV E/A ratio:  2.29 Ida Rogue MD Electronically signed by Ida Rogue MD Signature Date/Time: 11/25/2019/2:19:12 PM    Final         Scheduled Meds: . feeding supplement (ENSURE ENLIVE)  237 mL Oral TID  . metoprolol tartrate  25 mg Oral Daily  . traZODone  50 mg Oral QHS  . vitamin B-12  1,000 mcg Oral Daily   Continuous Infusions: . sodium chloride 100 mL/hr at 11/26/19 0021  . sodium chloride    . pantoprozole (PROTONIX) infusion 8 mg/hr (11/26/19 0021)     LOS: 3 days    Time spent: 32  mins    Wyvonnia Dusky, MD [facility] Pager [phone]  If 7PM-7AM, please contact night-coverage [email or web address] 11/26/2019, 8:04 AM

## 2019-11-26 NOTE — Transfer of Care (Signed)
Immediate Anesthesia Transfer of Care Note  Patient: Vernon Patterson  Procedure(s) Performed: ESOPHAGOGASTRODUODENOSCOPY (EGD) WITH PROPOFOL (N/A )  Patient Location: PACU and Endoscopy Unit  Anesthesia Type:MAC  Level of Consciousness: awake, alert  and oriented  Airway & Oxygen Therapy: Patient Spontanous Breathing and Patient connected to nasal cannula oxygen  Post-op Assessment: Report given to RN and Post -op Vital signs reviewed and stable  Post vital signs: Reviewed and stable  Last Vitals:  Vitals Value Taken Time  BP 100/54   Temp    Pulse 51   Resp 16   SpO2 100     Last Pain:  Vitals:   11/26/19 1206  TempSrc:   PainSc: Asleep         Complications: No complications documented.

## 2019-11-26 NOTE — Anesthesia Postprocedure Evaluation (Deleted)
Anesthesia Post Note  Patient: Vernon Patterson  Procedure(s) Performed: ESOPHAGOGASTRODUODENOSCOPY (EGD) WITH PROPOFOL (N/A )  Anesthesia Type: MAC   No complications documented.   Last Vitals:  Vitals:   11/26/19 1105 11/26/19 1206  BP: 137/80 (!) 87/53  Pulse: (!) 52 (!) 51  Resp: 20 14  Temp: 36.5 C   SpO2: 100% 100%    Last Pain:  Vitals:   11/26/19 1206  TempSrc:   PainSc: Asleep                 Deanna L Short

## 2019-11-26 NOTE — Progress Notes (Addendum)
   11/26/19 1900  Clinical Encounter Type  Visited With Patient and family together  Visit Type Initial  Referral From Nurse  Consult/Referral To Chaplain  Chaplain responded to an OR for an AD. When she arrived at the room she was greeted by patient and family. Patient and family are in agreement with completing AD. Patient says he trust his family. They will complete and notify chaplain when it is done. After AD is completed, a chaplain will look for two volunteers and a notary. One of them asked about a service tomorrow and chaplain gave them time of chaplain service (7:45 am). Patient's daughter will bring him to chaplain service in the morning. Chaplain told them, she looks forward to seeing them.

## 2019-11-26 NOTE — Consult Note (Signed)
Hematology/Oncology Consult note [facility] [phone] [phone]  Patient Care Team: Albina Billet, MD as PCP - General (Internal Medicine) Dionisio David, MD (Cardiology)   Name of the patient: Vernon Patterson  [id number]  [birth year]   Date of visit: 11/26/19 REASON FOR COSULTATION:  GE junction tumor History of presenting illness-  64 y.o. male with PMH  with PMH including CAD, depression, hypertension, anxiety was seen in consultation at the request of  No ref. provider found  for evaluation of progressive fatigue and tiredness, loss of appetite, dark stool, nausea and vomiting. Symptom has been ongoing for a few days. ED course, vital signs were normal except borderline blood pressure.  Elevated troponin.  EKG showed T wave inversion laterally and inferiorly CBC showed anemia with hemoglobin 7.4, positive stool occult by ER physician.  Patient was seen by gastroenterology for work-up of GI bleeding.  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  Medium size raised submucosal and ulcerated mass found in the left piriform sinus not obstructing airway.  Lesion is suspicious for malignancy.  Hematology oncology was consulted for further evaluation and management.  Patient reports significant unintentional weight loss for the past few months.Patient drinks alcohol.  Current everyday smoker-cigar.  He lives in [place] by himself.  His 2 daughters are at bedside.     Review of Systems  Constitutional: Positive for appetite change, fatigue and unexpected weight change. Negative for chills and fever.  HENT:   Negative for hearing loss and voice change.   Eyes: Negative for eye problems and icterus.  Respiratory: Positive for cough. Negative for chest tightness and shortness of breath.   Cardiovascular: Negative for chest pain and leg swelling.    Gastrointestinal: Positive for nausea and vomiting. Negative for abdominal distention and abdominal pain.  Endocrine: Negative for hot flashes.  Genitourinary: Negative for difficulty urinating, dysuria and frequency.   Musculoskeletal: Negative for arthralgias.  Skin: Negative for itching and rash.  Neurological: Negative for light-headedness and numbness.  Hematological: Negative for adenopathy. Does not bruise/bleed easily.  Psychiatric/Behavioral: Negative for confusion.    No Known Allergies  Patient Active Problem List   Diagnosis Date Noted  . GI bleeding 11/23/2019  . CAD (coronary artery disease) 04/18/2013     Past Medical History:  Diagnosis Date  . Anxiety   . Coronary artery disease   . Depression   . Heart murmur   . Hypertension      Past Surgical History:  Procedure Laterality Date  . CARDIAC CATHETERIZATION    . CORONARY ARTERY BYPASS GRAFT    . CORONARY ARTERY BYPASS GRAFT N/A 04/18/2013   Procedure: CORONARY ARTERY BYPASS GRAFTING (CABG);  Surgeon: Gaye Pollack, MD;  Location: [place];  Service: Open Heart Surgery;  Laterality: N/A;  Times  3  using left internal mammary artery and endoscopically harvested right saphenous vein  . ENDOVEIN HARVEST OF GREATER SAPHENOUS VEIN Right 04/18/2013   Procedure: ENDOVEIN HARVEST OF GREATER SAPHENOUS VEIN;  Surgeon: Gaye Pollack, MD;  Location: [place];  Service: Open Heart Surgery;  Laterality: Right;  Some of the saphenous vein from the lower leg also used.  . INTRAOPERATIVE TRANSESOPHAGEAL ECHOCARDIOGRAM N/A 04/18/2013   Procedure: INTRAOPERATIVE TRANSESOPHAGEAL ECHOCARDIOGRAM;  Surgeon: Gaye Pollack, MD;  Location: [facility];  Service: Open Heart Surgery;  Laterality: N/A;    Social History   Socioeconomic History  . Marital status: Married  Spouse name: Not on file  . Number of children: Not on file  . Years of education: Not on file  . Highest education level: Not on file  Occupational History  . Not on  file  Tobacco Use  . Smoking status: Current Every Day Smoker    Types: Cigars  . Smokeless tobacco: Never Used  . Tobacco comment: 2-3 cigars/day  Substance and Sexual Activity  . Alcohol use: Yes    Comment: 5 bottles of wine/week  . Drug use: No  . Sexual activity: Never  Other Topics Concern  . Not on file  Social History Narrative  . Not on file   Social Determinants of Health   Financial Resource Strain:   . Difficulty of Paying Living Expenses:   Food Insecurity:   . Worried About Charity fundraiser in the Last Year:   . Arboriculturist in the Last Year:   Transportation Needs:   . Film/video editor (Medical):   Marland Kitchen Lack of Transportation (Non-Medical):   Physical Activity:   . Days of Exercise per Week:   . Minutes of Exercise per Session:   Stress:   . Feeling of Stress :   Social Connections:   . Frequency of Communication with Friends and Family:   . Frequency of Social Gatherings with Friends and Family:   . Attends Religious Services:   . Active Member of Clubs or Organizations:   . Attends Archivist Meetings:   Marland Kitchen Marital Status:   Intimate Partner Violence:   . Fear of Current or Ex-Partner:   . Emotionally Abused:   Marland Kitchen Physically Abused:   . Sexually Abused:      History reviewed. No pertinent family history.   Current Facility-Administered Medications:  .  0.9 %  sodium chloride infusion, , Intravenous, Continuous, Mansy, Jan A, MD, Last Rate: 100 mL/hr at 11/26/19 1144, Restarted at 11/26/19 1202 .  acetaminophen (TYLENOL) tablet 650 mg, 650 mg, Oral, Q6H PRN **OR** acetaminophen (TYLENOL) suppository 650 mg, 650 mg, Rectal, Q6H PRN, Mansy, Jan A, MD .  feeding supplement (ENSURE ENLIVE) (ENSURE ENLIVE) liquid 237 mL, 237 mL, Oral, TID, Wyvonnia Dusky, MD .  metoprolol tartrate (LOPRESSOR) tablet 25 mg, 25 mg, Oral, Daily, Mansy, Jan A, MD, 25 mg at 11/26/19 0958 .  ondansetron (ZOFRAN) tablet 4 mg, 4 mg, Oral, Q6H PRN **OR**  ondansetron (ZOFRAN) injection 4 mg, 4 mg, Intravenous, Q6H PRN, Mansy, Jan A, MD .  pantoprazole (PROTONIX) 80 mg in sodium chloride 0.9 % 100 mL (0.8 mg/mL) infusion, 8 mg/hr, Intravenous, Continuous, Siadecki, Sebastian, MD, Last Rate: 10 mL/hr at 11/26/19 0021, 8 mg/hr at 11/26/19 0021 .  traZODone (DESYREL) tablet 50 mg, 50 mg, Oral, QHS, Mansy, Jan A, MD, 50 mg at 11/25/19 2118 .  vitamin B-12 (CYANOCOBALAMIN) tablet 1,000 mcg, 1,000 mcg, Oral, Daily, Jonathon Bellows, MD, 1,000 mcg at 11/26/19 0957 .  zolpidem (AMBIEN) tablet 5 mg, 5 mg, Oral, QHS PRN, Mansy, Jan A, MD, 5 mg at 11/24/19 2226  Facility-Administered Medications Ordered in Other Encounters:  .  [START ON 12/01/2019] sodium chloride flush (NS) 0.9 % injection 3 mL, 3 mL, Intravenous, Q12H, Arvil Chaco, [place]   Physical exam:  Vitals:   11/26/19 1216 11/26/19 1226 11/26/19 1236 11/26/19 1314  BP: 113/60 117/66 137/76 (!) 143/73  Pulse: (!) 52 (!) 52 (!) 51 (!) 51  Resp: 16   18  Temp:      TempSrc:  SpO2: 96% 96% 98% 100%  Weight:      Height:       Physical Exam Constitutional:      General: He is not in acute distress.    Appearance: He is not diaphoretic.  HENT:     Head: Normocephalic and atraumatic.     Nose: Nose normal.     Mouth/Throat:     Pharynx: No oropharyngeal exudate.  Eyes:     General: No scleral icterus.    Pupils: Pupils are equal, round, and reactive to light.  Cardiovascular:     Rate and Rhythm: Normal rate and regular rhythm.     Heart sounds: Murmur heard.   Pulmonary:     Effort: Pulmonary effort is normal. No respiratory distress.     Breath sounds: No rales.  Chest:     Chest wall: No tenderness.  Abdominal:     General: There is no distension.     Palpations: Abdomen is soft.     Tenderness: There is no abdominal tenderness.  Musculoskeletal:        General: Normal range of motion.     Cervical back: Normal range of motion and neck supple.  Skin:    General: Skin  is warm and dry.     Findings: No erythema.     Comments: Scattered ecchymosis  Neurological:     Mental Status: He is alert and oriented to person, place, and time.     Cranial Nerves: No cranial nerve deficit.     Motor: No abnormal muscle tone.     Coordination: Coordination normal.  Psychiatric:        Mood and Affect: Affect normal.         CMP Latest Ref Rng & Units 11/26/2019  Glucose 70 - 99 mg/dL 99  BUN 8 - 23 mg/dL 9  Creatinine 0.61 - 1.24 mg/dL 0.61  Sodium 135 - 145 mmol/L 139  Potassium 3.5 - 5.1 mmol/L 2.8(L)  Chloride 98 - 111 mmol/L 113(H)  CO2 22 - 32 mmol/L 21(L)  Calcium 8.9 - 10.3 mg/dL 7.8(L)  Total Protein 6.5 - 8.1 g/dL 5.0(L)  Total Bilirubin 0.3 - 1.2 mg/dL 0.9  Alkaline Phos 38 - 126 U/L 40  AST 15 - 41 U/L 21  ALT 0 - 44 U/L 13   CBC Latest Ref Rng & Units 11/26/2019  WBC 4.0 - 10.5 K/uL 7.3  Hemoglobin 13.0 - 17.0 g/dL 9.0(L)  Hematocrit 39 - 52 % 27.1(L)  Platelets 150 - 400 K/uL 164    RADIOGRAPHIC STUDIES: I have personally reviewed the radiological images as listed and agreed with the findings in the report. ECHOCARDIOGRAM COMPLETE  Result Date: 11/25/2019    ECHOCARDIOGRAM REPORT   Patient Name:   DELRAY REZA Date of Exam: 11/25/2019 Medical Rec #:  [id number]      Height:       68.0 in Accession #:    [id number]     Weight:       129.9 lb Date of Birth:  02-22-1956      BSA:          1.701 m Patient Age:    50 years       BP:           143/81 mmHg Patient Gender: M              HR:           58 bpm. Exam Location:  [place]  Procedure: 2D Echo, Color Doppler and Cardiac Doppler Indications:     R01.2 Abnormal Heart Sounds Nec  History:         Patient has no prior history of Echocardiogram examinations.                  CAD; Prior CABG.  Sonographer:     Charmayne Sheer RDCS (AE) Referring Phys:  [id number] Arvil Chaco Diagnosing Phys: Ida Rogue MD IMPRESSIONS  1. Left ventricular ejection fraction, by estimation, is 60 to 65%. The left  ventricle has normal function. The left ventricle has no regional wall motion abnormalities. Left ventricular diastolic parameters are consistent with Grade II diastolic dysfunction (pseudonormalization).  2. Right ventricular systolic function is normal. The right ventricular size is normal. There is mildly elevated pulmonary artery systolic pressure.  3. Left atrial size was mildly dilated.  4. The aortic valve is heavily calcified. Aortic valve regurgitation is mild. Severe aortic valve stenosis. Aortic valve area, by VTI measures 0.86 cm. Aortic valve mean gradient measures 38.0 mmHg. Aortic valve Vmax measures 4.02 m/s. FINDINGS  Left Ventricle: Left ventricular ejection fraction, by estimation, is 60 to 65%. The left ventricle has normal function. The left ventricle has no regional wall motion abnormalities. The left ventricular internal cavity size was normal in size. There is  no left ventricular hypertrophy. Left ventricular diastolic parameters are consistent with Grade II diastolic dysfunction (pseudonormalization). Right Ventricle: The right ventricular size is normal. No increase in right ventricular wall thickness. Right ventricular systolic function is normal. There is mildly elevated pulmonary artery systolic pressure. The tricuspid regurgitant velocity is 2.53  m/s, and with an assumed right atrial pressure of 10 mmHg, the estimated right ventricular systolic pressure is 16.6 mmHg. Left Atrium: Left atrial size was mildly dilated. Right Atrium: Right atrial size was normal in size. Pericardium: There is no evidence of pericardial effusion. Mitral Valve: The mitral valve is normal in structure. There is mild thickening of the mitral valve leaflet(s). Normal mobility of the mitral valve leaflets. Mild mitral valve regurgitation. No evidence of mitral valve stenosis. MV peak gradient, 4.2 mmHg. The mean mitral valve gradient is 2.0 mmHg. Tricuspid Valve: The tricuspid valve is normal in structure.  Tricuspid valve regurgitation is not demonstrated. No evidence of tricuspid stenosis. Aortic Valve: The aortic valve is normal in structure. Aortic valve regurgitation is mild. Severe aortic stenosis is present. Aortic valve mean gradient measures 38.0 mmHg. Aortic valve peak gradient measures 64.6 mmHg. Aortic valve area, by VTI measures  0.86 cm. Pulmonic Valve: The pulmonic valve was normal in structure. Pulmonic valve regurgitation is not visualized. No evidence of pulmonic stenosis. Aorta: The aortic root is normal in size and structure. Venous: The inferior vena cava is normal in size with greater than 50% respiratory variability, suggesting right atrial pressure of 3 mmHg. IAS/Shunts: No atrial level shunt detected by color flow Doppler.  LEFT VENTRICLE PLAX 2D LVIDd:         3.94 cm  Diastology LVIDs:         2.13 cm  LV e' lateral:   9.90 cm/s LV PW:         1.26 cm  LV E/e' lateral: 10.4 LV IVS:        1.13 cm  LV e' medial:    5.22 cm/s LVOT diam:     2.40 cm  LV E/e' medial:  19.7 LV SV:         89 LV SV  Index:   52 LVOT Area:     4.52 cm  RIGHT VENTRICLE RV Basal diam:  3.15 cm LEFT ATRIUM             Index       RIGHT ATRIUM           Index LA diam:        4.20 cm 2.47 cm/m  RA Area:     12.70 cm LA Vol (A2C):   62.5 ml 36.74 ml/m RA Volume:   27.80 ml  16.34 ml/m LA Vol (A4C):   48.2 ml 28.33 ml/m LA Biplane Vol: 55.2 ml 32.45 ml/m  AORTIC VALVE                    PULMONIC VALVE AV Area (Vmax):    0.82 cm     PV Vmax:       2.01 m/s AV Area (Vmean):   0.95 cm     PV Vmean:      135.000 cm/s AV Area (VTI):     0.86 cm     PV VTI:        0.470 m AV Vmax:           402.00 cm/s  PV Peak grad:  16.2 mmHg AV Vmean:          247.500 cm/s PV Mean grad:  9.0 mmHg AV VTI:            1.040 m AV Peak Grad:      64.6 mmHg AV Mean Grad:      38.0 mmHg LVOT Vmax:         72.50 cm/s LVOT Vmean:        51.900 cm/s LVOT VTI:          0.197 m LVOT/AV VTI ratio: 0.19  AORTA Ao Root diam: 3.80 cm MITRAL VALVE                 TRICUSPID VALVE MV Area (PHT): 4.04 cm     TR Peak grad:   25.6 mmHg MV Peak grad:  4.2 mmHg     TR Vmax:        253.00 cm/s MV Mean grad:  2.0 mmHg MV Vmax:       1.02 m/s     SHUNTS MV Vmean:      58.1 cm/s    Systemic VTI:  0.20 m MV Decel Time: 188 msec     Systemic Diam: 2.40 cm MV E velocity: 103.00 cm/s MV A velocity: 45.00 cm/s MV E/A ratio:  2.29 Ida Rogue MD Electronically signed by Ida Rogue MD Signature Date/Time: 11/25/2019/2:19:12 PM    Final     Assessment and plan- Patient is a 64 y.o. male with chronic history of alcohol use, smoker, CAD, hypertension, depression presents for evaluation of profound weakness, blood in the stool, nausea and vomiting. 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 Awaiting biopsy results. Continue supportive care, antiemetics, PPI, liquid diet  #Left piriform mass, appreciate ENT evaluation #Patient will need outpatient PET scan for further evaluation and staging. Check CEA.  #Normocytic anemia with hemoglobin 9 Folate normal.  Vitamin B12 is pending.  Iron panel showed decreased iron saturation, normal ferritin at 229.  Check reticulocyte panel.  #Coagulopathy, Recent ultrasound abdomen 10/19/2019 showed increased echogenicity with liver likely fatty  infiltration. PT 17.5, PTT 39.   # systolic heart murmur, echo showed LVEF 60-65%. Diastolic dysfunction. Aortic stenosis # cough, check CXR  #Hypokalemia, potassium 2.8, given his chronic alcoholism, recommend to check magnesium-magnesium came back 1.6.  Proceed with IV magnesium sulfate 2 g x 1.   Thank you for allowing me to participate in the care of this patient.  Total face to face encounter time for this patient visit was 70 min. >50% of the time was  spent in counseling and coordination of care.    Earlie Server, MD,  PhD Hematology Oncology [facility] Pager- [phone] 11/26/2019

## 2019-11-26 NOTE — Progress Notes (Signed)
MD notified via text. Pts daughters request an update from the doctor. MD says she will Nourse into the room to update family. I will continue to assess.

## 2019-11-26 NOTE — Progress Notes (Addendum)
Progress Note  Patient Name: Vernon Patterson Date of Encounter: 11/26/2019  Primary Cardiologist: [facility], Dr. Rockey Situ  Subjective   No chest pain, racing heart rate, palpitations.    Does report some nausea but denies any emesis.  Seen today prior to his endoscopy.  Provided with a printout of pre-catheterizations instructions and reviewed the necessary preprocedure guidelines.  Answered questions regarding aortic stenosis and LHC.  *Risks and benefits of cardiac catheterization have been discussed with the patient.  These include bleeding, infection, kidney damage, stroke, heart attack, death.  The patient understands these risks and is willing to proceed. Answered any questions the patient may have about the procedure.*   Inpatient Medications    Scheduled Meds:  feeding supplement (ENSURE ENLIVE)  237 mL Oral TID   metoprolol tartrate  25 mg Oral Daily   traZODone  50 mg Oral QHS   vitamin B-12  1,000 mcg Oral Daily   Continuous Infusions:  sodium chloride 100 mL/hr at 11/26/19 0021   sodium chloride     pantoprozole (PROTONIX) infusion 8 mg/hr (11/26/19 0021)   PRN Meds: acetaminophen **OR** acetaminophen, ondansetron **OR** ondansetron (ZOFRAN) IV, zolpidem   Vital Signs    Vitals:   11/25/19 1106 11/25/19 1546 11/25/19 1916 11/26/19 0521  BP: (!) 150/85 120/71 112/75 114/71  Pulse: (!) 57 (!) 58 (!) 57 (!) 58  Resp: 17 16 20 20   Temp: 98.3 F (36.8 C) 97.9 F (36.6 C) 97.6 F (36.4 C) 98.5 F (36.9 C)  TempSrc: Oral  Oral   SpO2: 100% 100% 100% 100%  Weight:    64.9 kg  Height:        Intake/Output Summary (Last 24 hours) at 11/26/2019 0842 Last data filed at 11/26/2019 0531 Gross per 24 hour  Intake --  Output 250 ml  Net -250 ml   Last 3 Weights 11/26/2019 11/24/2019 11/23/2019  Weight (lbs) 143 lb 129 lb 14.4 oz 129 lb 14.4 oz  Weight (kg) 64.864 kg 58.922 kg 58.922 kg      Telemetry    SB-SR, 50-60s- Personally Reviewed  ECG    No  new tracings- Personally Reviewed  Physical Exam   GEN: No acute distress.   Neck: No JVD Cardiac: bradycardic but regular, 3/6 harsh systolic murmur best appreciated in RUSB but heard throughout the cardiac exam.  No rubs, or gallops.  Respiratory: Clear to auscultation bilaterally. GI: Soft, nontender, non-distended  MS: No bilateral lower extremity edema; No deformity. Neuro:  Nonfocal  Psych: Normal affect   Labs    High Sensitivity Troponin:   Recent Labs  Lab 11/23/19 1819 11/23/19 2010  TROPONINIHS 75* 70*      Chemistry Recent Labs  Lab 11/23/19 1819 11/24/19 0513  NA 141 142  K 3.5 3.8  CL 107 111  CO2 22 24  GLUCOSE 110* 99  BUN 15 15  CREATININE 0.81 0.79  CALCIUM 9.0 8.2*  GFRNONAA >60 >60  GFRAA >60 >60  ANIONGAP 12 7     Hematology Recent Labs  Lab 11/23/19 1819 11/23/19 1819 11/24/19 0513 11/24/19 0929 11/24/19 1534  WBC 8.1  --  7.3  --   --   RBC 2.21*  --  2.80*  --   --   HGB 7.4*   < > 9.0* 9.1* 9.9*  HCT 22.0*   < > 26.0* 26.1* 29.0*  MCV 99.5  --  92.9  --   --   MCH 33.5  --  32.1  --   --  MCHC 33.6  --  34.6  --   --   RDW 13.2  --  16.7*  --   --   PLT 213  --  156  --   --    < > = values in this interval not displayed.    BNPNo results for input(s): BNP, PROBNP in the last 168 hours.   DDimer No results for input(s): DDIMER in the last 168 hours.   Radiology    ECHOCARDIOGRAM COMPLETE  Result Date: 11/25/2019    ECHOCARDIOGRAM REPORT   Patient Name:   Vernon Patterson Date of Exam: 11/25/2019 Medical Rec #:  [id number]      Height:       68.0 in Accession #:    [id number]     Weight:       129.9 lb Date of Birth:  May 28, 1955      BSA:          1.701 m Patient Age:    64 years       BP:           143/81 mmHg Patient Gender: M              HR:           58 bpm. Exam Location:  [facility] Procedure: 2D Echo, Color Doppler and Cardiac Doppler Indications:     R01.2 Abnormal Heart Sounds Nec  History:         Patient has no prior  history of Echocardiogram examinations.                  CAD; Prior CABG.  Sonographer:     Charmayne Sheer RDCS (AE) Referring Phys:  [id number] Arvil Chaco Diagnosing Phys: Ida Rogue MD IMPRESSIONS  1. Left ventricular ejection fraction, by estimation, is 60 to 65%. The left ventricle has normal function. The left ventricle has no regional wall motion abnormalities. Left ventricular diastolic parameters are consistent with Grade II diastolic dysfunction (pseudonormalization).  2. Right ventricular systolic function is normal. The right ventricular size is normal. There is mildly elevated pulmonary artery systolic pressure.  3. Left atrial size was mildly dilated.  4. The aortic valve is heavily calcified. Aortic valve regurgitation is mild. Severe aortic valve stenosis. Aortic valve area, by VTI measures 0.86 cm. Aortic valve mean gradient measures 38.0 mmHg. Aortic valve Vmax measures 4.02 m/s. FINDINGS  Left Ventricle: Left ventricular ejection fraction, by estimation, is 60 to 65%. The left ventricle has normal function. The left ventricle has no regional wall motion abnormalities. The left ventricular internal cavity size was normal in size. There is  no left ventricular hypertrophy. Left ventricular diastolic parameters are consistent with Grade II diastolic dysfunction (pseudonormalization). Right Ventricle: The right ventricular size is normal. No increase in right ventricular wall thickness. Right ventricular systolic function is normal. There is mildly elevated pulmonary artery systolic pressure. The tricuspid regurgitant velocity is 2.53  m/s, and with an assumed right atrial pressure of 10 mmHg, the estimated right ventricular systolic pressure is 67.3 mmHg. Left Atrium: Left atrial size was mildly dilated. Right Atrium: Right atrial size was normal in size. Pericardium: There is no evidence of pericardial effusion. Mitral Valve: The mitral valve is normal in structure. There is mild thickening of  the mitral valve leaflet(s). Normal mobility of the mitral valve leaflets. Mild mitral valve regurgitation. No evidence of mitral valve stenosis. MV peak gradient, 4.2 mmHg. The mean mitral valve gradient is 2.0 mmHg.  Tricuspid Valve: The tricuspid valve is normal in structure. Tricuspid valve regurgitation is not demonstrated. No evidence of tricuspid stenosis. Aortic Valve: The aortic valve is normal in structure. Aortic valve regurgitation is mild. Severe aortic stenosis is present. Aortic valve mean gradient measures 38.0 mmHg. Aortic valve peak gradient measures 64.6 mmHg. Aortic valve area, by VTI measures  0.86 cm. Pulmonic Valve: The pulmonic valve was normal in structure. Pulmonic valve regurgitation is not visualized. No evidence of pulmonic stenosis. Aorta: The aortic root is normal in size and structure. Venous: The inferior vena cava is normal in size with greater than 50% respiratory variability, suggesting right atrial pressure of 3 mmHg. IAS/Shunts: No atrial level shunt detected by color flow Doppler.  LEFT VENTRICLE PLAX 2D LVIDd:         3.94 cm  Diastology LVIDs:         2.13 cm  LV e' lateral:   9.90 cm/s LV PW:         1.26 cm  LV E/e' lateral: 10.4 LV IVS:        1.13 cm  LV e' medial:    5.22 cm/s LVOT diam:     2.40 cm  LV E/e' medial:  19.7 LV SV:         89 LV SV Index:   52 LVOT Area:     4.52 cm  RIGHT VENTRICLE RV Basal diam:  3.15 cm LEFT ATRIUM             Index       RIGHT ATRIUM           Index LA diam:        4.20 cm 2.47 cm/m  RA Area:     12.70 cm LA Vol (A2C):   62.5 ml 36.74 ml/m RA Volume:   27.80 ml  16.34 ml/m LA Vol (A4C):   48.2 ml 28.33 ml/m LA Biplane Vol: 55.2 ml 32.45 ml/m  AORTIC VALVE                    PULMONIC VALVE AV Area (Vmax):    0.82 cm     PV Vmax:       2.01 m/s AV Area (Vmean):   0.95 cm     PV Vmean:      135.000 cm/s AV Area (VTI):     0.86 cm     PV VTI:        0.470 m AV Vmax:           402.00 cm/s  PV Peak grad:  16.2 mmHg AV Vmean:           247.500 cm/s PV Mean grad:  9.0 mmHg AV VTI:            1.040 m AV Peak Grad:      64.6 mmHg AV Mean Grad:      38.0 mmHg LVOT Vmax:         72.50 cm/s LVOT Vmean:        51.900 cm/s LVOT VTI:          0.197 m LVOT/AV VTI ratio: 0.19  AORTA Ao Root diam: 3.80 cm MITRAL VALVE                TRICUSPID VALVE MV Area (PHT): 4.04 cm     TR Peak grad:   25.6 mmHg MV Peak grad:  4.2 mmHg     TR Vmax:  253.00 cm/s MV Mean grad:  2.0 mmHg MV Vmax:       1.02 m/s     SHUNTS MV Vmean:      58.1 cm/s    Systemic VTI:  0.20 m MV Decel Time: 188 msec     Systemic Diam: 2.40 cm MV E velocity: 103.00 cm/s MV A velocity: 45.00 cm/s MV E/A ratio:  2.29 Ida Rogue MD Electronically signed by Ida Rogue MD Signature Date/Time: 11/25/2019/2:19:12 PM    Final     Cardiac Studies   TTE 11/25/2019 1. Left ventricular ejection fraction, by estimation, is 60 to 65%. The  left ventricle has normal function. The left ventricle has no regional  wall motion abnormalities. Left ventricular diastolic parameters are  consistent with Grade II diastolic  dysfunction (pseudonormalization).  2. Right ventricular systolic function is normal. The right ventricular  size is normal. There is mildly elevated pulmonary artery systolic  pressure.  3. Left atrial size was mildly dilated.  4. The aortic valve is heavily calcified. Aortic valve regurgitation is  mild. Severe aortic valve stenosis. Aortic valve area, by VTI measures  0.86 cm. Aortic valve mean gradient measures 38.0 mmHg. Aortic valve Vmax  measures 4.02 m/s.   Patient Profile     64 y.o. male with a hx of CABG 04/18/2013 (LIMA to LAD, SVG to OM1, and SVG to PDA), alcohol use, current tobacco use, severe AS, and who is being seen today for the evaluation of preoperative evaluation before endoscopy.  Assessment & Plan    Preoperative cardiac evaluation --Initially consulted for preoperative cardiac evaluation prior to 7/17 EGD for GIB.  --Surgery  specific risk of EGD low.    --No reported angina.  --Euvolemic and well compensated on exam. --Functional capacity low at  1-4 METS.  --Echo recommended given systolic murmur on initial exam and performed prior to EGD, revealing severe AS.  --Outpatient LHC scheduled 7/22 for evaluation of AS /CAD for possible AVR.  --Reasonable to proceed with EGD today 7/17 without additional testing or intervention. ASA discontinued given GIB. Restart at 81mg  daily once felt safe to do so by GI team. Unless history of CVA, would caution against further high dose ASA administration.   CAD s/p CABG --No current angina.  --H/o 2014 CABG as in consult note. Lost to follow-up after CABG. --Discontinued PTA ASA 325mg .  --Restart of ASA 81mg  following EGD per GI and when felt safe to do so.  --Continue BB.  --Recommend initiation of statin before discharge for risk factor modification.   --Discharge with PRN SL nitro. --Will plan for follow-up LDL and LFTs in 6-8 weeks s/p start of statin. --Further ischemic workup planned via outpatient cardiac cath 7/22 as above.  Elevated HS Tn --No CP.  --Suspect supply demand ischemia.  --HS Tn minimally elevated and flat trending.  --Echo with nl EF.  --No need for emergent ischemic workup this admission. --Outpatient LHC 7/22. Consented as above. Pre-cath paperwork provided.  Severe aortic valve stenosis --Explained AS in detail to patient. Answered several questions from patient and sister (by phone). Discussed TAVR versus open heart surgery in detail.  --Pending outpatient 7/22 LHC as above for further AS workup.   Anemia --Consider transfusion below 8.0. --Pending EGD for GIB. --Restart of ASA at reduced 81mg  daily per GI (PTA ASA 325mg ).  --Daily CBC.  HLD --As above, recommend discharge with statin before discharge.  --Recheck LDL and LFTs in 6-8 weeks after start of statin.   Alcohol use /  Tobacco use --Complete cessation advised.    Remainder per IM  For questions or updates, please contact [place] Please consult [email or web address] for contact info under     Signed, Arvil Chaco, PA-C  11/26/2019, 8:42 AM      Available data reviewed. Agree with findings, assessment, and plan as outlined by Marrianne Mood, PA-C.  The patient is currently in endoscopy.  His daughter is in the room and I had a lengthy discussion with her.  She just arrived in town from the [place] area.  She and her sister have both come in to see their father.  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  I explained the finding of severe aortic stenosis with her and discussed treatment options briefly.  Advised that I will come back tomorrow morning and see him to further discuss outpatient diagnostic studies and further evaluation.  Sherren Mocha, M.D. 11/26/2019 11:15 AM

## 2019-11-26 NOTE — Op Note (Signed)
[facility] Gastroenterology Patient Name: Vernon Patterson Procedure Date: 11/26/2019 10:58 AM MRN: [id number] Account #: [id number] Date of Birth: 17-Oct-1955 Admit Type: Outpatient Age: 64 Room: [facility] ENDO ROOM 4 Gender: Male Note Status: Finalized Procedure:             Upper GI endoscopy Indications:           Iron deficiency anemia secondary to chronic blood                         loss, Dysphagia, Melena Providers:             Lin Landsman MD, MD Medicines:             Monitored Anesthesia Care Complications:         No immediate complications. Estimated blood loss:                         Minimal. Procedure:             Pre-Anesthesia Assessment:                        - Prior to the procedure, a History and Physical was                         performed, and patient medications and allergies were                         reviewed. The patient is competent. The risks and                         benefits of the procedure and the sedation options and                         risks were discussed with the patient. All questions                         were answered and informed consent was obtained.                         Patient identification and proposed procedure were                         verified by the physician, the nurse, the                         anesthesiologist, the anesthetist and the technician                         in the pre-procedure area in the procedure room in the                         endoscopy suite. Mental Status Examination: alert and                         oriented. Airway Examination: normal oropharyngeal                         airway and neck mobility. Respiratory Examination:  clear to auscultation. CV Examination: normal.                         Prophylactic Antibiotics: The patient does not require                         prophylactic antibiotics. Prior Anticoagulants: The                          patient has taken no previous anticoagulant or                         antiplatelet agents. ASA Grade Assessment: IV - A                         patient with severe systemic disease that is a                         constant threat to life. After reviewing the risks and                         benefits, the patient was deemed in satisfactory                         condition to undergo the procedure. The anesthesia                         plan was to use monitored anesthesia care (MAC).                         Immediately prior to administration of medications,                         the patient was re-assessed for adequacy to receive                         sedatives. The heart rate, respiratory rate, oxygen                         saturations, blood pressure, adequacy of pulmonary                         ventilation, and response to care were monitored                         throughout the procedure. The physical status of the                         patient was re-assessed after the procedure.                        After obtaining informed consent, the endoscope was                         passed under direct vision. Throughout the procedure,                         the patient's blood pressure, pulse, and oxygen  saturations were monitored continuously. The Endoscope                         was introduced through the mouth, and advanced to the                         second part of duodenum. The upper GI endoscopy was                         accomplished without difficulty. The patient tolerated                         the procedure well. Findings:      The duodenal bulb and second portion of the duodenum were normal.      A large, infiltrative, submucosal and ulcerated, partially       circumferential mass with oozing bleeding and stigmata of recent       bleeding was found in the cardia, extending below from GE junction at       39cm from incisors. Biopsies  were taken with a cold forceps for       histology.      One malignant-appearing, intrinsic moderate (circumferential scarring or       stenosis; an endoscope may pass) stenosis was found 39 cm from the       incisors. Pill was impacted right above the GE junstion, which was       pushed down with scope with mild resistance. This stenosis measured less       than one cm (in length). The stenosis was traversed. Estimated blood       loss was minimal.      A medium sized raised, submucosal and ulcerative mass was found in the       left pyriform sinus. The mass is not obstructing the airway. Impression:            - Normal duodenal bulb and second portion of the                         duodenum.                        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                         Biopsied.                        - Malignant-appearing esophageal stenosis.                        - Medium sized raised, submucosal and ulcerative mass                         found in the left pyriform sinus, not obstructing the                         airway. This lesion is suspicious for malignancy. Recommendation:        - Return patient to hospital ward for ongoing care.                        -  Full liquid diet today.                        - Continue present medications.                        - Use a proton pump inhibitor PO BID.                        - Refer to an ENT specialist.                        - Refer to an oncologist. Procedure Code(s):     --- Professional ---                        [phone], Esophagogastroduodenoscopy, flexible,                         transoral; with biopsy, single or multiple Diagnosis Code(s):     --- Professional ---                        D49.0, Neoplasm of unspecified behavior of digestive                         system                        K22.2, Esophageal obstruction                        J39.2, Other diseases of pharynx                        D50.0,  Iron deficiency anemia secondary to blood loss                         (chronic)                        R13.10, Dysphagia, unspecified                        K92.1, Melena (includes Hematochezia) CPT copyright 2019 American Medical Association. All rights reserved. The codes documented in this report are preliminary and upon coder review may  be revised to meet current compliance requirements. Dr. Ulyess Mort Lin Landsman MD, MD 11/26/2019 12:14:18 PM This report has been signed electronically. Number of Addenda: 0 Note Initiated On: 11/26/2019 10:58 AM Estimated Blood Loss:  Estimated blood loss was minimal.      [facility]

## 2019-11-26 NOTE — Anesthesia Preprocedure Evaluation (Addendum)
Anesthesia Evaluation  Patient identified by MRN, date of birth, ID band Patient awake    Reviewed: Allergy & Precautions, H&P , NPO status , Patient's Chart, lab work & pertinent test results  Airway Mallampati: III  TM Distance: >3 FB Neck ROM: limited    Dental  (+) Chipped, Caps   Pulmonary Current Smoker,    Pulmonary exam normal        Cardiovascular Exercise Tolerance: Good hypertension, + CAD and + CABG  + Valvular Problems/Murmurs AS  + Systolic murmurs    Neuro/Psych PSYCHIATRIC DISORDERS negative neurological ROS     GI/Hepatic negative GI ROS, Neg liver ROS,   Endo/Other  negative endocrine ROS  Renal/GU negative Renal ROS  negative genitourinary   Musculoskeletal   Abdominal   Peds  Hematology negative hematology ROS (+)   Anesthesia Other Findings Patient has cardiac clearance for this procedure.   Per cardiology the EGD must be done before the patients cardiac catheretization 2/2 his GI bleeding history.  Past Medical History: No date: Anxiety No date: Coronary artery disease No date: Depression No date: Heart murmur No date: Hypertension  Past Surgical History: No date: CARDIAC CATHETERIZATION No date: CORONARY ARTERY BYPASS GRAFT 04/18/2013: CORONARY ARTERY BYPASS GRAFT; N/A     Comment:  Procedure: CORONARY ARTERY BYPASS GRAFTING (CABG);                Surgeon: Gaye Pollack, MD;  Location: [facility];  Service:               Open Heart Surgery;  Laterality: N/A;  Times  3  using               left internal mammary artery and endoscopically harvested              right saphenous vein 04/18/2013: ENDOVEIN HARVEST OF GREATER SAPHENOUS VEIN; Right     Comment:  Procedure: ENDOVEIN HARVEST OF GREATER SAPHENOUS VEIN;                Surgeon: Gaye Pollack, MD;  Location: [facility];  Service:               Open Heart Surgery;  Laterality: Right;  Some of the               saphenous vein from the lower  leg also used. 04/18/2013: INTRAOPERATIVE TRANSESOPHAGEAL ECHOCARDIOGRAM; N/A     Comment:  Procedure: INTRAOPERATIVE TRANSESOPHAGEAL               ECHOCARDIOGRAM;  Surgeon: Gaye Pollack, MD;  Location:               [facility];  Service: Open Heart Surgery;  Laterality: N/A;  BMI    Body Mass Index: 21.74 kg/m      Reproductive/Obstetrics negative OB ROS                           Anesthesia Physical Anesthesia Plan  ASA: IV  Anesthesia Plan: General   Post-op Pain Management:    Induction: Intravenous  PONV Risk Score and Plan: Propofol infusion and TIVA  Airway Management Planned: Natural Airway and Nasal Cannula  Additional Equipment:   Intra-op Plan:   Post-operative Plan:   Informed Consent: I have reviewed the patients History and Physical, chart, labs and discussed the procedure including the risks, benefits and alternatives for the proposed anesthesia with the patient or authorized representative  who has indicated his/her understanding and acceptance.     Dental Advisory Given  Plan Discussed with: Anesthesiologist, CRNA and Surgeon  Anesthesia Plan Comments: (Patient informed that they are higher risk for complications from anesthesia during this procedure due to their medical history.  Patient voiced understanding.  Patient consented for risks of anesthesia including but not limited to:  - adverse reactions to medications - risk of intubation if required - damage to eyes, teeth, lips or other oral mucosa - nerve damage due to positioning  - sore throat or hoarseness - Damage to heart, brain, nerves, lungs, other parts of body or loss of life  Patient voiced understanding.)        Anesthesia Quick Evaluation

## 2019-11-27 LAB — URINALYSIS, ROUTINE W REFLEX MICROSCOPIC
Bacteria, UA: NONE SEEN
Bilirubin Urine: NEGATIVE
Glucose, UA: NEGATIVE mg/dL
Hgb urine dipstick: NEGATIVE
Ketones, ur: NEGATIVE mg/dL
Leukocytes,Ua: NEGATIVE
Nitrite: NEGATIVE
Protein, ur: 30 mg/dL — AB
Specific Gravity, Urine: 1.029 (ref 1.005–1.030)
Squamous Epithelial / HPF: NONE SEEN (ref 0–5)
pH: 5 (ref 5.0–8.0)

## 2019-11-27 LAB — CBC
HCT: 25.4 % — ABNORMAL LOW (ref 39.0–52.0)
Hemoglobin: 9.1 g/dL — ABNORMAL LOW (ref 13.0–17.0)
MCH: 32.9 pg (ref 26.0–34.0)
MCHC: 35.8 g/dL (ref 30.0–36.0)
MCV: 91.7 fL (ref 80.0–100.0)
Platelets: 162 10*3/uL (ref 150–400)
RBC: 2.77 MIL/uL — ABNORMAL LOW (ref 4.22–5.81)
RDW: 17.2 % — ABNORMAL HIGH (ref 11.5–15.5)
WBC: 7 10*3/uL (ref 4.0–10.5)
nRBC: 0 % (ref 0.0–0.2)

## 2019-11-27 LAB — BASIC METABOLIC PANEL
Anion gap: 4 — ABNORMAL LOW (ref 5–15)
BUN: 8 mg/dL (ref 8–23)
CO2: 19 mmol/L — ABNORMAL LOW (ref 22–32)
Calcium: 7.5 mg/dL — ABNORMAL LOW (ref 8.9–10.3)
Chloride: 116 mmol/L — ABNORMAL HIGH (ref 98–111)
Creatinine, Ser: 0.76 mg/dL (ref 0.61–1.24)
GFR calc Af Amer: >60 mL/min (ref >60)
GFR calc non Af Amer: >60 mL/min (ref >60)
Glucose, Bld: 84 mg/dL (ref 70–99)
Potassium: 3 mmol/L — ABNORMAL LOW (ref 3.5–5.1)
Sodium: 139 mmol/L (ref 135–145)

## 2019-11-27 LAB — FOLATE: Folate: 8.3 ng/mL (ref >5.9)

## 2019-11-27 LAB — MAGNESIUM: Magnesium: 2 mg/dL (ref 1.7–2.4)

## 2019-11-27 MED ORDER — POTASSIUM CHLORIDE CRYS ER 20 MEQ PO TBCR
40.0000 meq | EXTENDED_RELEASE_TABLET | Freq: Two times a day (BID) | ORAL | Status: AC
  Administered 2019-11-27 (×2): 40 meq via ORAL
  Filled 2019-11-27 (×2): qty 2

## 2019-11-27 MED ORDER — FERROUS SULFATE 325 (65 FE) MG PO TABS
325.0000 mg | ORAL_TABLET | Freq: Every day | ORAL | Status: DC
  Administered 2019-11-28: 325 mg via ORAL
  Filled 2019-11-27: qty 1

## 2019-11-27 MED ORDER — PANTOPRAZOLE SODIUM 40 MG PO TBEC
40.0000 mg | DELAYED_RELEASE_TABLET | Freq: Two times a day (BID) | ORAL | Status: DC
  Administered 2019-11-27 – 2019-11-28 (×3): 40 mg via ORAL
  Filled 2019-11-27 (×3): qty 1

## 2019-11-27 NOTE — Progress Notes (Signed)
Progress Note  Patient Name: Vernon Patterson Date of Encounter: 11/27/2019  [facility] Cardiologist: No primary care provider on file.   Subjective   Feeling okay.  Denies chest pain or shortness of breath.  Daughter at bedside.  Inpatient Medications    Scheduled Meds:  feeding supplement (ENSURE ENLIVE)  237 mL Oral TID   metoprolol tartrate  25 mg Oral Daily   multivitamin with minerals  1 tablet Oral Daily   pantoprazole  40 mg Oral BID   potassium chloride  40 mEq Oral BID   traZODone  50 mg Oral QHS   vitamin B-12  1,000 mcg Oral Daily   Continuous Infusions:  sodium chloride 100 mL/hr at 11/27/19 0721   PRN Meds: acetaminophen **OR** acetaminophen, ondansetron **OR** ondansetron (ZOFRAN) IV, zolpidem   Vital Signs    Vitals:   11/26/19 1947 11/27/19 0359 11/27/19 0500 11/27/19 0712  BP: 126/90 131/67  (!) 150/76  Pulse: (!) 58 60  (!) 55  Resp: 18 16  19   Temp:  98 F (36.7 C)  97.7 F (36.5 C)  TempSrc:    Oral  SpO2: 100% 100%  96%  Weight:   64.4 kg   Height:        Intake/Output Summary (Last 24 hours) at 11/27/2019 1004 Last data filed at 11/27/2019 0210 Gross per 24 hour  Intake 1148.53 ml  Output 150 ml  Net 998.53 ml   Last 3 Weights 11/27/2019 11/26/2019 11/26/2019  Weight (lbs) 141 lb 15.6 oz 143 lb 143 lb  Weight (kg) 64.4 kg 64.864 kg 64.864 kg      Telemetry    Sinus bradycardia approximately 50 bpm - Personally Reviewed   Physical Exam  Alert, oriented, no distress GEN: No acute distress.   Neck: No JVD Cardiac:  Bradycardic and regular with 3/6 harsh late peaking systolic murmur heard at the right upper sternal border and left lower sternal border Respiratory: Clear to auscultation bilaterally. GI: Soft, nontender, non-distended  MS: No edema; No deformity. Neuro:  Nonfocal  Psych: Normal affect   Labs    High Sensitivity Troponin:   Recent Labs  Lab 11/23/19 1819 11/23/19 2010  TROPONINIHS 75* 70*       Chemistry Recent Labs  Lab 11/24/19 0513 11/26/19 0840 11/27/19 0357  NA 142 139 139  K 3.8 2.8* 3.0*  CL 111 113* 116*  CO2 24 21* 19*  GLUCOSE 99 99 84  BUN 15 9 8   CREATININE 0.79 0.61 0.76  CALCIUM 8.2* 7.8* 7.5*  PROT  --  5.0*  --   ALBUMIN  --  2.2*  --   AST  --  21  --   ALT  --  13  --   ALKPHOS  --  40  --   BILITOT  --  0.9  --   GFRNONAA >60 >60 >60  GFRAA >60 >60 >60  ANIONGAP 7 5 4*     Hematology Recent Labs  Lab 11/24/19 0513 11/24/19 0929 11/24/19 1534 11/26/19 0840 11/27/19 0357  WBC 7.3  --   --  7.3 7.0  RBC 2.80*  --   --  2.86*   2.81* 2.77*  HGB 9.0*   < > 9.9* 9.0* 9.1*  HCT 26.0*   < > 29.0* 27.1* 25.4*  MCV 92.9  --   --  94.8 91.7  MCH 32.1  --   --  31.5 32.9  MCHC 34.6  --   --  33.2 35.8  RDW 16.7*  --   --  17.7* 17.2*  PLT 156  --   --  164 162   < > = values in this interval not displayed.    BNPNo results for input(s): BNP, PROBNP in the last 168 hours.   DDimer No results for input(s): DDIMER in the last 168 hours.   Radiology    DG Chest 2 View  Result Date: 11/26/2019 CLINICAL DATA:  Cough and weakness EXAM: CHEST - 2 VIEW COMPARISON:  Chest radiograph 10/19/2019 FINDINGS: Stable cardiomediastinal contours status post median sternotomy. Minimal atelectasis at the right lung base. No focal consolidation. On lateral view there is a 9 mm nodule inferiorly which is not well seen on the frontal projection. No pneumothorax or pleural effusion. No acute finding in the visualized skeleton. IMPRESSION: 1. No evidence of active disease in the chest. 2. 9 mm nodule inferiorly on the lateral view which is not well seen on the frontal projection. Consider nonemergent chest CT for further evaluation. Electronically Signed   By: Audie Pinto M.D.   On: 11/26/2019 17:12     Patient Profile     64 y.o. male with a hx of CABG 04/18/2013(LIMA to LAD, SVG to OM1, and SVG to PDA), alcohol use, current tobacco use, severe AS, andwho  is being seen today for the evaluation ofpreoperative evaluation before endoscopy.  Assessment & Plan    1.  Severe aortic stenosis, stage C (asymptomatic) 2.  CAD status post CABG with no active angina 3. GI malignancy  Reviewed findings of yesterday's endoscopy.  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  Oncology evaluation reviewed.  Plans noted for PET scan for staging, biopsy results pending.  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  While plan was to proceed with elective right and left heart catheterization for further assessment of aortic stenosis, this should be delayed while the patient is undergoing staging evaluation.  Recommend canceling his outpatient heart catheterization and following up closely as an outpatient.  Plans discussed with patient and family at length.  If he requires extensive surgery for treating his malignancy, will need to consider surgical risk in the context of his severe aortic stenosis.  However, if he is found to have extensive stage cancer with plans for palliative chemotherapy, a conservative approach to his aortic stenosis would be appropriate.  I think it is best to arrange cardiology follow-up in about 3 to 4 weeks which should give him time to undergo further staging and determine a plan for his treatment.  Will arrange an outpatient visit with Dr. Rockey Situ.   [facility] will sign off.   Medication Recommendations:  none Other recommendations (labs, testing, etc):  none Follow up as an outpatient:  Will arrange in 3-4 weeks  For questions or updates, please contact [place] Please consult [email or web address] for contact info under        Signed, Sherren Mocha, MD  11/27/2019, 10:04 AM

## 2019-11-27 NOTE — Progress Notes (Signed)
Hematology/Oncology Progress Note [facility] Telephone:([phone] [phone]  Patient Care Team: Albina Billet, MD as PCP - General (Internal Medicine) Dionisio David, MD (Cardiology)   Name of the patient: Vernon Patterson  [id number]  10/26/1955  Date of visit: 11/27/19   INTERVAL HISTORY-  Patient reports feeling little weak this morning, better now.  No acute overnight events. Appetite is poor.  No nausea or vomiting.  His cough is better.  No shortness of breath. Daughter is at the bedside.    Review of systems- Review of Systems  Constitutional: Positive for appetite change, fatigue and unexpected weight change.  Respiratory: Negative for cough.   Gastrointestinal: Positive for constipation. Negative for abdominal pain and nausea.  Genitourinary: Negative for dysuria.   Musculoskeletal: Negative for back pain.  Skin: Negative for rash.  Neurological: Negative for headaches.  Hematological: Bruises/bleeds easily.  Psychiatric/Behavioral: Negative for confusion.    No Known Allergies  Patient Active Problem List   Diagnosis Date Noted  . Hypomagnesemia   . Cough   . Gastric tumor   . Piriform sinus tumor   . Hypokalemia   . GI bleeding 11/23/2019  . CAD (coronary artery disease) 04/18/2013     Past Medical History:  Diagnosis Date  . Anxiety   . Coronary artery disease   . Depression   . Heart murmur   . Hypertension      Past Surgical History:  Procedure Laterality Date  . CARDIAC CATHETERIZATION    . CORONARY ARTERY BYPASS GRAFT    . CORONARY ARTERY BYPASS GRAFT N/A 04/18/2013   Procedure: CORONARY ARTERY BYPASS GRAFTING (CABG);  Surgeon: Gaye Pollack, MD;  Location: [place];  Service: Open Heart Surgery;  Laterality: N/A;  Times  3  using left internal mammary artery and endoscopically harvested right saphenous vein  . ENDOVEIN HARVEST OF GREATER SAPHENOUS VEIN Right 04/18/2013   Procedure: ENDOVEIN HARVEST OF  GREATER SAPHENOUS VEIN;  Surgeon: Gaye Pollack, MD;  Location: [place];  Service: Open Heart Surgery;  Laterality: Right;  Some of the saphenous vein from the lower leg also used.  . INTRAOPERATIVE TRANSESOPHAGEAL ECHOCARDIOGRAM N/A 04/18/2013   Procedure: INTRAOPERATIVE TRANSESOPHAGEAL ECHOCARDIOGRAM;  Surgeon: Gaye Pollack, MD;  Location: [facility] OR;  Service: Open Heart Surgery;  Laterality: N/A;    Social History   Socioeconomic History  . Marital status: Married    Spouse name: Not on file  . Number of children: Not on file  . Years of education: Not on file  . Highest education level: Not on file  Occupational History  . Not on file  Tobacco Use  . Smoking status: Current Every Day Smoker    Types: Cigars  . Smokeless tobacco: Never Used  . Tobacco comment: 2-3 cigars/day  Substance and Sexual Activity  . Alcohol use: Yes    Comment: 5 bottles of wine/week  . Drug use: No  . Sexual activity: Never  Other Topics Concern  . Not on file  Social History Narrative  . Not on file   Social Determinants of Health   Financial Resource Strain:   . Difficulty of Paying Living Expenses:   Food Insecurity:   . Worried About Charity fundraiser in the Last Year:   . Arboriculturist in the Last Year:   Transportation Needs:   . Film/video editor (Medical):   Marland Kitchen Lack of Transportation (Non-Medical):   Physical Activity:   . Days of  Exercise per Week:   . Minutes of Exercise per Session:   Stress:   . Feeling of Stress :   Social Connections:   . Frequency of Communication with Friends and Family:   . Frequency of Social Gatherings with Friends and Family:   . Attends Religious Services:   . Active Member of Clubs or Organizations:   . Attends Archivist Meetings:   Marland Kitchen Marital Status:   Intimate Partner Violence:   . Fear of Current or Ex-Partner:   . Emotionally Abused:   Marland Kitchen Physically Abused:   . Sexually Abused:      History reviewed. No pertinent family  history.   Current Facility-Administered Medications:  .  0.9 %  sodium chloride infusion, , Intravenous, Continuous, Mansy, Jan A, MD, Last Rate: 100 mL/hr at 11/27/19 0721, New Bag at 11/27/19 0721 .  acetaminophen (TYLENOL) tablet 650 mg, 650 mg, Oral, Q6H PRN, 650 mg at 11/27/19 0731 **OR** acetaminophen (TYLENOL) suppository 650 mg, 650 mg, Rectal, Q6H PRN, Mansy, Jan A, MD .  feeding supplement (ENSURE ENLIVE) (ENSURE ENLIVE) liquid 237 mL, 237 mL, Oral, TID, Wyvonnia Dusky, MD .  Derrill Memo ON 11/28/2019] ferrous sulfate tablet 325 mg, 325 mg, Oral, Q breakfast, Jimmye Norman, Jamiese M, MD .  metoprolol tartrate (LOPRESSOR) tablet 25 mg, 25 mg, Oral, Daily, Mansy, Jan A, MD, 25 mg at 11/27/19 1044 .  multivitamin with minerals tablet 1 tablet, 1 tablet, Oral, Daily, Wyvonnia Dusky, MD, 1 tablet at 11/27/19 1044 .  ondansetron (ZOFRAN) tablet 4 mg, 4 mg, Oral, Q6H PRN **OR** ondansetron (ZOFRAN) injection 4 mg, 4 mg, Intravenous, Q6H PRN, Mansy, Jan A, MD .  pantoprazole (PROTONIX) EC tablet 40 mg, 40 mg, Oral, BID, Wyvonnia Dusky, MD, 40 mg at 11/27/19 1044 .  potassium chloride SA (KLOR-CON) CR tablet 40 mEq, 40 mEq, Oral, BID, Wyvonnia Dusky, MD, 40 mEq at 11/27/19 1044 .  traZODone (DESYREL) tablet 50 mg, 50 mg, Oral, QHS, Mansy, Jan A, MD, 50 mg at 11/25/19 2118 .  vitamin B-12 (CYANOCOBALAMIN) tablet 1,000 mcg, 1,000 mcg, Oral, Daily, Jonathon Bellows, MD, 1,000 mcg at 11/27/19 1044 .  zolpidem (AMBIEN) tablet 5 mg, 5 mg, Oral, QHS PRN, Mansy, Jan A, MD, 5 mg at 11/26/19 2027  Facility-Administered Medications Ordered in Other Encounters:  .  [START ON 12/01/2019] sodium chloride flush (NS) 0.9 % injection 3 mL, 3 mL, Intravenous, Q12H, Arvil Chaco, [place]   Physical exam:  Vitals:   11/27/19 0359 11/27/19 0500 11/27/19 0712 11/27/19 1155  BP: 131/67  (!) 150/76 (!) 153/80  Pulse: 60  (!) 55 (!) 54  Resp: 16  19 19   Temp: 98 F (36.7 C)  97.7 F (36.5 C) 98.1 F  (36.7 C)  TempSrc:   Oral   SpO2: 100%  96% 100%  Weight:  141 lb 15.6 oz (64.4 kg)    Height:       Physical Exam Constitutional:      General: He is not in acute distress.    Appearance: He is not diaphoretic.  HENT:     Head: Normocephalic and atraumatic.     Nose: Nose normal.     Mouth/Throat:     Pharynx: No oropharyngeal exudate.  Eyes:     General: No scleral icterus.    Pupils: Pupils are equal, round, and reactive to light.  Cardiovascular:     Rate and Rhythm: Normal rate and regular rhythm.     Heart  sounds: No murmur heard.   Pulmonary:     Effort: Pulmonary effort is normal. No respiratory distress.     Breath sounds: No rales.  Chest:     Chest wall: No tenderness.  Abdominal:     General: There is no distension.     Palpations: Abdomen is soft.     Tenderness: There is no abdominal tenderness.  Musculoskeletal:        General: Normal range of motion.     Cervical back: Normal range of motion and neck supple.  Skin:    General: Skin is warm and dry.     Findings: No erythema.  Neurological:     Mental Status: He is alert and oriented to person, place, and time.     Cranial Nerves: No cranial nerve deficit.     Motor: No abnormal muscle tone.     Coordination: Coordination normal.  Psychiatric:        Mood and Affect: Affect normal.        CMP Latest Ref Rng & Units 11/27/2019  Glucose 70 - 99 mg/dL 84  BUN 8 - 23 mg/dL 8  Creatinine 0.61 - 1.24 mg/dL 0.76  Sodium 135 - 145 mmol/L 139  Potassium 3.5 - 5.1 mmol/L 3.0(L)  Chloride 98 - 111 mmol/L 116(H)  CO2 22 - 32 mmol/L 19(L)  Calcium 8.9 - 10.3 mg/dL 7.5(L)  Total Protein 6.5 - 8.1 g/dL -  Total Bilirubin 0.3 - 1.2 mg/dL -  Alkaline Phos 38 - 126 U/L -  AST 15 - 41 U/L -  ALT 0 - 44 U/L -   CBC Latest Ref Rng & Units 11/27/2019  WBC 4.0 - 10.5 K/uL 7.0  Hemoglobin 13.0 - 17.0 g/dL 9.1(L)  Hematocrit 39 - 52 % 25.4(L)  Platelets 150 - 400 K/uL 162    RADIOGRAPHIC STUDIES: I have  personally reviewed the radiological images as listed and agreed with the findings in the report. DG Chest 2 View  Result Date: 11/26/2019 CLINICAL DATA:  Cough and weakness EXAM: CHEST - 2 VIEW COMPARISON:  Chest radiograph 10/19/2019 FINDINGS: Stable cardiomediastinal contours status post median sternotomy. Minimal atelectasis at the right lung base. No focal consolidation. On lateral view there is a 9 mm nodule inferiorly which is not well seen on the frontal projection. No pneumothorax or pleural effusion. No acute finding in the visualized skeleton. IMPRESSION: 1. No evidence of active disease in the chest. 2. 9 mm nodule inferiorly on the lateral view which is not well seen on the frontal projection. Consider nonemergent chest CT for further evaluation. Electronically Signed   By: Audie Pinto M.D.   On: 11/26/2019 17:12   ECHOCARDIOGRAM COMPLETE  Result Date: 11/25/2019    ECHOCARDIOGRAM REPORT   Patient Name:   SANJUAN SAWA Date of Exam: 11/25/2019 Medical Rec #:  [id number]      Height:       68.0 in Accession #:    [id number]     Weight:       129.9 lb Date of Birth:  April 17, 1956      BSA:          1.701 m Patient Age:    22 years       BP:           143/81 mmHg Patient Gender: M              HR:           58 bpm.  Exam Location:  [facility] Procedure: 2D Echo, Color Doppler and Cardiac Doppler Indications:     R01.2 Abnormal Heart Sounds Nec  History:         Patient has no prior history of Echocardiogram examinations.                  CAD; Prior CABG.  Sonographer:     Charmayne Sheer RDCS (AE) Referring Phys:  [id number] Arvil Chaco Diagnosing Phys: Ida Rogue MD IMPRESSIONS  1. Left ventricular ejection fraction, by estimation, is 60 to 65%. The left ventricle has normal function. The left ventricle has no regional wall motion abnormalities. Left ventricular diastolic parameters are consistent with Grade II diastolic dysfunction (pseudonormalization).  2. Right ventricular systolic function is  normal. The right ventricular size is normal. There is mildly elevated pulmonary artery systolic pressure.  3. Left atrial size was mildly dilated.  4. The aortic valve is heavily calcified. Aortic valve regurgitation is mild. Severe aortic valve stenosis. Aortic valve area, by VTI measures 0.86 cm. Aortic valve mean gradient measures 38.0 mmHg. Aortic valve Vmax measures 4.02 m/s. FINDINGS  Left Ventricle: Left ventricular ejection fraction, by estimation, is 60 to 65%. The left ventricle has normal function. The left ventricle has no regional wall motion abnormalities. The left ventricular internal cavity size was normal in size. There is  no left ventricular hypertrophy. Left ventricular diastolic parameters are consistent with Grade II diastolic dysfunction (pseudonormalization). Right Ventricle: The right ventricular size is normal. No increase in right ventricular wall thickness. Right ventricular systolic function is normal. There is mildly elevated pulmonary artery systolic pressure. The tricuspid regurgitant velocity is 2.53  m/s, and with an assumed right atrial pressure of 10 mmHg, the estimated right ventricular systolic pressure is 54.0 mmHg. Left Atrium: Left atrial size was mildly dilated. Right Atrium: Right atrial size was normal in size. Pericardium: There is no evidence of pericardial effusion. Mitral Valve: The mitral valve is normal in structure. There is mild thickening of the mitral valve leaflet(s). Normal mobility of the mitral valve leaflets. Mild mitral valve regurgitation. No evidence of mitral valve stenosis. MV peak gradient, 4.2 mmHg. The mean mitral valve gradient is 2.0 mmHg. Tricuspid Valve: The tricuspid valve is normal in structure. Tricuspid valve regurgitation is not demonstrated. No evidence of tricuspid stenosis. Aortic Valve: The aortic valve is normal in structure. Aortic valve regurgitation is mild. Severe aortic stenosis is present. Aortic valve mean gradient measures  38.0 mmHg. Aortic valve peak gradient measures 64.6 mmHg. Aortic valve area, by VTI measures  0.86 cm. Pulmonic Valve: The pulmonic valve was normal in structure. Pulmonic valve regurgitation is not visualized. No evidence of pulmonic stenosis. Aorta: The aortic root is normal in size and structure. Venous: The inferior vena cava is normal in size with greater than 50% respiratory variability, suggesting right atrial pressure of 3 mmHg. IAS/Shunts: No atrial level shunt detected by color flow Doppler.  LEFT VENTRICLE PLAX 2D LVIDd:         3.94 cm  Diastology LVIDs:         2.13 cm  LV e' lateral:   9.90 cm/s LV PW:         1.26 cm  LV E/e' lateral: 10.4 LV IVS:        1.13 cm  LV e' medial:    5.22 cm/s LVOT diam:     2.40 cm  LV E/e' medial:  19.7 LV SV:  89 LV SV Index:   52 LVOT Area:     4.52 cm  RIGHT VENTRICLE RV Basal diam:  3.15 cm LEFT ATRIUM             Index       RIGHT ATRIUM           Index LA diam:        4.20 cm 2.47 cm/m  RA Area:     12.70 cm LA Vol (A2C):   62.5 ml 36.74 ml/m RA Volume:   27.80 ml  16.34 ml/m LA Vol (A4C):   48.2 ml 28.33 ml/m LA Biplane Vol: 55.2 ml 32.45 ml/m  AORTIC VALVE                    PULMONIC VALVE AV Area (Vmax):    0.82 cm     PV Vmax:       2.01 m/s AV Area (Vmean):   0.95 cm     PV Vmean:      135.000 cm/s AV Area (VTI):     0.86 cm     PV VTI:        0.470 m AV Vmax:           402.00 cm/s  PV Peak grad:  16.2 mmHg AV Vmean:          247.500 cm/s PV Mean grad:  9.0 mmHg AV VTI:            1.040 m AV Peak Grad:      64.6 mmHg AV Mean Grad:      38.0 mmHg LVOT Vmax:         72.50 cm/s LVOT Vmean:        51.900 cm/s LVOT VTI:          0.197 m LVOT/AV VTI ratio: 0.19  AORTA Ao Root diam: 3.80 cm MITRAL VALVE                TRICUSPID VALVE MV Area (PHT): 4.04 cm     TR Peak grad:   25.6 mmHg MV Peak grad:  4.2 mmHg     TR Vmax:        253.00 cm/s MV Mean grad:  2.0 mmHg MV Vmax:       1.02 m/s     SHUNTS MV Vmean:      58.1 cm/s    Systemic VTI:  0.20 m  MV Decel Time: 188 msec     Systemic Diam: 2.40 cm MV E velocity: 103.00 cm/s MV A velocity: 45.00 cm/s MV E/A ratio:  2.29 Ida Rogue MD Electronically signed by Ida Rogue MD Signature Date/Time: 11/25/2019/2:19:12 PM    Final     Assessment and plan-  Patient is a 64 y.o. male with chronic history of alcohol use, smoker, CAD, hypertension, depression presents for evaluation of profound weakness, blood in the stool, nausea and vomiting. 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 Awaiting pathology results.  Continue supportive care, antiemetics, PPI, liquid diet. Small lung nodule, Patient will need outpatient PET scan for further evaluation and staging. Check CEA.  # Left piriform mass, has been seen by ENT.  He will follow-up outpatient with Dr. Kathyrn Sheriff  for laryngoscopy  #Normocytic anemia with hemoglobin 9 Folate normal.  Vitamin B12 is at low normal end.  He is  started on oral vitamin B12 supplementation. 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 Recent ultrasound abdomen 10/19/2019 showed increased echogenicity with liver likely fatty infiltration. PT 17.5, PTT 39.   # systolic heart murmur, echo showed LVEF 60-65%. Diastolic dysfunction. Aortic stenosis.  Follow-up with cardiology.  Cardiology note was reviewed.  Depending on his cancer staging work-up, he may proceed with further work-up versus conservative management.  #Hypokalemia, potassium has improved, potassium level at 3.  Managed by hospitalist, hypomagnesemia, improved.  From oncology aspect, patient can be discharged for outpatient follow-up.  Our office will contact patient on 11/28/2019 to schedule him for outpatient PET scan and outpatient follow-up to discuss pathology report.  Thank you for allowing me to participate in  the care of this patient.   Earlie Server, MD, PhD Hematology Oncology [facility] Pager- [phone] 11/27/2019

## 2019-11-27 NOTE — Progress Notes (Signed)
   11/27/19 1500  Clinical Encounter Type  Visited With Patient and family together  Visit Type Follow-up  Referral From Nurse  Consult/Referral To Chaplain  Spiritual Encounters  Spiritual Needs Other (Comment)  [place] assisted family on answering questions regarding AD. Shared that there was no one at hospital to complete the notary but will reach out to [facility] tomorrow to complete document. Pastoral care visit was appreciated.

## 2019-11-27 NOTE — Progress Notes (Signed)
PROGRESS NOTE    Vernon Patterson  WJX:[id number] DOB: Aug 04, 1955 DOA: 11/23/2019 PCP: Albina Billet, MD   Assessment & Plan:   Active Problems:   GI bleeding   Hypomagnesemia   Cough   Gastric tumor   Piriform sinus tumor   Hypokalemia  GI bleeding: etiology unclear.  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 Path pending. Continue on protonix. Advanced diet. Continue to hold aspirin. GI following and recs apprec.  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 Will decide as an outpatient if mass needs to biopsied or not based on GE junction mass biopsy results and PET scan results. ENT recs apprec. Smokes cigars daily   Alcohol abuse: alcohol cessation counseling. Pt states if it "makes me feel good, I'll continue to drink it". Started on vitamin B12 as level is low end of normal  Hypokalemia: KCl repleted. Will continue to monitor  Severe aortic stenosis: as per echo. Will hold off on cardiac cath next week until staging is done. Will f/u w/ cardio in 3-4 weeks as an outpatient.   Elevated troponins: no chest pain. Trending down. Likely due to demand ischemia   Acute blood loss anemia: due to GI bleeding as well as iron deficiency anemia.  S/p 2 units of pRBCs. H&H are stable. Started on iron supplements. Will continue to monitor H&H  Hypertension: continue on metoprolol   Severe protein calorie nutrition: will continue ensure.   Failure to thrive: likely secondary to possible malignancy. Pt has difficulty completing ADLs & has had recent significant weight loss. Pt evidently survived the last 2 weeks on ice cream only.    DVT prophylaxis: SCDs Code Status: full  Family Communication: discussed w/ pt's daughter who is at bedside and answered her questions Disposition Plan: depends on PT/OT recs   Consultants:   GI  Cardio    Onco  ENT   Procedures:  EGD 11/26/19: Normal duodenal bulb and second portion of the duodenum. 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 Biopsied. Malignant-appearing esophageal stenosis. Medium sized raised, submucosal and ulcerative mass found in the left pyriform sinus, not obstructing the airway. This lesion is suspicious for malignancy.   Antimicrobials:    Subjective: Pt c/o weakness  Objective: Vitals:   11/26/19 1947 11/27/19 0359 11/27/19 0500 11/27/19 0712  BP: 126/90 131/67  (!) 150/76  Pulse: (!) 58 60  (!) 55  Resp: 18 16  19   Temp:  98 F (36.7 C)  97.7 F (36.5 C)  TempSrc:    Oral  SpO2: 100% 100%  96%  Weight:   64.4 kg   Height:        Intake/Output Summary (Last 24 hours) at 11/27/2019 0747 Last data filed at 11/27/2019 0210 Gross per 24 hour  Intake 1148.53 ml  Output 200 ml  Net 948.53 ml   Filed Weights   11/26/19 0521 11/26/19 1105 11/27/19 0500  Weight: 64.9 kg 64.9 kg 64.4 kg    Examination:  General exam: Appears calm and comfortable. Disheveled  Respiratory system: diminished breath sounds b/l. No rales  Cardiovascular system: S1 & S2+. No rubs, gallops or clicks. Gastrointestinal system: Abdomen is nondistended, soft and nontender. Hypoactive bowel sounds heard. Central nervous system: Alert and oriented. Moves all 4 extremities  Psychiatry: Judgement and insight appear normal. Mood & affect appropriate.     Data Reviewed: I have personally reviewed  following labs and imaging studies  CBC: Recent Labs  Lab 11/23/19 1819 11/23/19 1819 11/24/19 0513 11/24/19 0929 11/24/19 1534 11/26/19 0840 11/27/19 0357  WBC 8.1  --  7.3  --   --  7.3 7.0  HGB 7.4*   < > 9.0* 9.1* 9.9* 9.0* 9.1*  HCT 22.0*   < > 26.0* 26.1* 29.0* 27.1* 25.4*  MCV 99.5  --  92.9  --   --  94.8 91.7  PLT 213  --  156  --   --  164 162   < > = values in this interval not displayed.   Basic Metabolic Panel: Recent Labs  Lab 11/23/19 1819  11/24/19 0513 11/26/19 0840 11/27/19 0357  NA 141 142 139 139  K 3.5 3.8 2.8* 3.0*  CL 107 111 113* 116*  CO2 22 24 21* 19*  GLUCOSE 110* 99 99 84  BUN 15 15 9 8   CREATININE 0.81 0.79 0.61 0.76  CALCIUM 9.0 8.2* 7.8* 7.5*  MG  --   --  1.6*  --    GFR: Estimated Creatinine Clearance: 82.7 mL/min (by C-G formula based on SCr of 0.76 mg/dL). Liver Function Tests: Recent Labs  Lab 11/26/19 0840  AST 21  ALT 13  ALKPHOS 40  BILITOT 0.9  PROT 5.0*  ALBUMIN 2.2*   No results for input(s): LIPASE, AMYLASE in the last 168 hours. No results for input(s): AMMONIA in the last 168 hours. Coagulation Profile: No results for input(s): INR, PROTIME in the last 168 hours. Cardiac Enzymes: No results for input(s): CKTOTAL, CKMB, CKMBINDEX, TROPONINI in the last 168 hours. BNP (last 3 results) No results for input(s): PROBNP in the last 8760 hours. HbA1C: Recent Labs    11/24/19 1534  HGBA1C 5.1   CBG: No results for input(s): GLUCAP in the last 168 hours. Lipid Profile: Recent Labs    11/24/19 0938  CHOL 119  HDL 19*  LDLCALC 80  TRIG 99  CHOLHDL 6.3   Thyroid Function Tests: Recent Labs    11/24/19 0938  TSH 2.366   Anemia Panel: Recent Labs    11/24/19 0936 11/26/19 0840 11/27/19 0357  VITAMINB12  --  299  --   FOLATE 10.3  --  8.3  FERRITIN  --  229  --   TIBC  --  147*  --   IRON  --  23*  --   RETICCTPCT  --  3.3*  --    Sepsis Labs: No results for input(s): PROCALCITON, LATICACIDVEN in the last 168 hours.  Recent Results (from the past 240 hour(s))  SARS Coronavirus 2 by RT PCR (hospital order, performed in [facility] hospital lab) Nasopharyngeal Nasopharyngeal Swab     Status: None   Collection Time: 11/23/19  8:10 PM   Specimen: Nasopharyngeal Swab  Result Value Ref Range Status   SARS Coronavirus 2 NEGATIVE NEGATIVE Final    Comment: (NOTE) SARS-CoV-2 target nucleic acids are NOT DETECTED.  The SARS-CoV-2 RNA is generally detectable in  upper and lower respiratory specimens during the acute phase of infection. The lowest concentration of SARS-CoV-2 viral copies this assay can detect is 250 copies / mL. A negative result does not preclude SARS-CoV-2 infection and should not be used as the sole basis for treatment or other patient management decisions.  A negative result may occur with improper specimen collection / handling, submission of specimen other than nasopharyngeal swab, presence of viral mutation(s) within the areas targeted by this assay, and inadequate  number of viral copies (<250 copies / mL). A negative result must be combined with clinical observations, patient history, and epidemiological information.  Fact Sheet for Patients:   StrictlyIdeas.no  Fact Sheet for Healthcare Providers: BankingDealers.co.za  This test is not yet approved or  cleared by the [place] FDA and has been authorized for detection and/or diagnosis of SARS-CoV-2 by FDA under an Emergency Use Authorization (EUA).  This EUA will remain in effect (meaning this test can be used) for the duration of the COVID-19 declaration under Section 564(b)(1) of the Act, 21 U.S.C. section 360bbb-3(b)(1), unless the authorization is terminated or revoked sooner.  Performed at [facility], [address], [place], [place] [address]          Radiology Studies: DG Chest 2 View  Result Date: 11/26/2019 CLINICAL DATA:  Cough and weakness EXAM: CHEST - 2 VIEW COMPARISON:  Chest radiograph 10/19/2019 FINDINGS: Stable cardiomediastinal contours status post median sternotomy. Minimal atelectasis at the right lung base. No focal consolidation. On lateral view there is a 9 mm nodule inferiorly which is not well seen on the frontal projection. No pneumothorax or pleural effusion. No acute finding in the visualized skeleton. IMPRESSION: 1. No evidence of active disease in the chest. 2. 9 mm  nodule inferiorly on the lateral view which is not well seen on the frontal projection. Consider nonemergent chest CT for further evaluation. Electronically Signed   By: Audie Pinto M.D.   On: 11/26/2019 17:12   ECHOCARDIOGRAM COMPLETE  Result Date: 11/25/2019    ECHOCARDIOGRAM REPORT   Patient Name:   Vernon Patterson Date of Exam: 11/25/2019 Medical Rec #:  [id number]      Height:       68.0 in Accession #:    [id number]     Weight:       129.9 lb Date of Birth:  [birth year]      BSA:          1.701 m Patient Age:    64 years       BP:           143/81 mmHg Patient Gender: M              HR:           58 bpm. Exam Location:  [facility] Procedure: 2D Echo, Color Doppler and Cardiac Doppler Indications:     R01.2 Abnormal Heart Sounds Nec  History:         Patient has no prior history of Echocardiogram examinations.                  CAD; Prior CABG.  Sonographer:     Charmayne Sheer RDCS (AE) Referring Phys:  [id number] Arvil Chaco Diagnosing Phys: Ida Rogue MD IMPRESSIONS  1. Left ventricular ejection fraction, by estimation, is 60 to 65%. The left ventricle has normal function. The left ventricle has no regional wall motion abnormalities. Left ventricular diastolic parameters are consistent with Grade II diastolic dysfunction (pseudonormalization).  2. Right ventricular systolic function is normal. The right ventricular size is normal. There is mildly elevated pulmonary artery systolic pressure.  3. Left atrial size was mildly dilated.  4. The aortic valve is heavily calcified. Aortic valve regurgitation is mild. Severe aortic valve stenosis. Aortic valve area, by VTI measures 0.86 cm. Aortic valve mean gradient measures 38.0 mmHg. Aortic valve Vmax measures 4.02 m/s. FINDINGS  Left Ventricle: Left ventricular ejection fraction, by estimation, is 60 to 65%.  The left ventricle has normal function. The left ventricle has no regional wall motion abnormalities. The left ventricular internal cavity size was normal  in size. There is  no left ventricular hypertrophy. Left ventricular diastolic parameters are consistent with Grade II diastolic dysfunction (pseudonormalization). Right Ventricle: The right ventricular size is normal. No increase in right ventricular wall thickness. Right ventricular systolic function is normal. There is mildly elevated pulmonary artery systolic pressure. The tricuspid regurgitant velocity is 2.53  m/s, and with an assumed right atrial pressure of 10 mmHg, the estimated right ventricular systolic pressure is 56.2 mmHg. Left Atrium: Left atrial size was mildly dilated. Right Atrium: Right atrial size was normal in size. Pericardium: There is no evidence of pericardial effusion. Mitral Valve: The mitral valve is normal in structure. There is mild thickening of the mitral valve leaflet(s). Normal mobility of the mitral valve leaflets. Mild mitral valve regurgitation. No evidence of mitral valve stenosis. MV peak gradient, 4.2 mmHg. The mean mitral valve gradient is 2.0 mmHg. Tricuspid Valve: The tricuspid valve is normal in structure. Tricuspid valve regurgitation is not demonstrated. No evidence of tricuspid stenosis. Aortic Valve: The aortic valve is normal in structure. Aortic valve regurgitation is mild. Severe aortic stenosis is present. Aortic valve mean gradient measures 38.0 mmHg. Aortic valve peak gradient measures 64.6 mmHg. Aortic valve area, by VTI measures  0.86 cm. Pulmonic Valve: The pulmonic valve was normal in structure. Pulmonic valve regurgitation is not visualized. No evidence of pulmonic stenosis. Aorta: The aortic root is normal in size and structure. Venous: The inferior vena cava is normal in size with greater than 50% respiratory variability, suggesting right atrial pressure of 3 mmHg. IAS/Shunts: No atrial level shunt detected by color flow Doppler.  LEFT VENTRICLE PLAX 2D LVIDd:         3.94 cm  Diastology LVIDs:         2.13 cm  LV e' lateral:   9.90 cm/s LV PW:          1.26 cm  LV E/e' lateral: 10.4 LV IVS:        1.13 cm  LV e' medial:    5.22 cm/s LVOT diam:     2.40 cm  LV E/e' medial:  19.7 LV SV:         89 LV SV Index:   52 LVOT Area:     4.52 cm  RIGHT VENTRICLE RV Basal diam:  3.15 cm LEFT ATRIUM             Index       RIGHT ATRIUM           Index LA diam:        4.20 cm 2.47 cm/m  RA Area:     12.70 cm LA Vol (A2C):   62.5 ml 36.74 ml/m RA Volume:   27.80 ml  16.34 ml/m LA Vol (A4C):   48.2 ml 28.33 ml/m LA Biplane Vol: 55.2 ml 32.45 ml/m  AORTIC VALVE                    PULMONIC VALVE AV Area (Vmax):    0.82 cm     PV Vmax:       2.01 m/s AV Area (Vmean):   0.95 cm     PV Vmean:      135.000 cm/s AV Area (VTI):     0.86 cm     PV VTI:        0.470  m AV Vmax:           402.00 cm/s  PV Peak grad:  16.2 mmHg AV Vmean:          247.500 cm/s PV Mean grad:  9.0 mmHg AV VTI:            1.040 m AV Peak Grad:      64.6 mmHg AV Mean Grad:      38.0 mmHg LVOT Vmax:         72.50 cm/s LVOT Vmean:        51.900 cm/s LVOT VTI:          0.197 m LVOT/AV VTI ratio: 0.19  AORTA Ao Root diam: 3.80 cm MITRAL VALVE                TRICUSPID VALVE MV Area (PHT): 4.04 cm     TR Peak grad:   25.6 mmHg MV Peak grad:  4.2 mmHg     TR Vmax:        253.00 cm/s MV Mean grad:  2.0 mmHg MV Vmax:       1.02 m/s     SHUNTS MV Vmean:      58.1 cm/s    Systemic VTI:  0.20 m MV Decel Time: 188 msec     Systemic Diam: 2.40 cm MV E velocity: 103.00 cm/s MV A velocity: 45.00 cm/s MV E/A ratio:  2.29 Ida Rogue MD Electronically signed by Ida Rogue MD Signature Date/Time: 11/25/2019/2:19:12 PM    Final         Scheduled Meds: . feeding supplement (ENSURE ENLIVE)  237 mL Oral TID  . metoprolol tartrate  25 mg Oral Daily  . multivitamin with minerals  1 tablet Oral Daily  . potassium chloride  40 mEq Oral BID  . traZODone  50 mg Oral QHS  . vitamin B-12  1,000 mcg Oral Daily   Continuous Infusions: . sodium chloride 100 mL/hr at 11/27/19 0721     LOS: 4 days    Time  spent: 32 mins    Wyvonnia Dusky, MD [facility] Pager [phone]  If 7PM-7AM, please contact night-coverage [email or web address] 11/27/2019, 7:47 AM

## 2019-11-27 NOTE — Progress Notes (Signed)
OT Cancellation Note  Patient Details Name: Ulys Favia Lewers MRN: [id number] DOB: 28-Mar-1956   Cancelled Treatment:    Reason Eval/Treat Not Completed: Patient not medically ready. OT order received and chart reviewed. Pt noted to have K+ 3.0 this AM. Per therapy guidelines, will hold evaluation until pt appropriate to participate in physical activity. Will follow up at later time/date as able.  Dessie Coma, M.S. OTR/L  11/27/19, 8:46 AM

## 2019-11-28 ENCOUNTER — Encounter: Payer: Self-pay | Admitting: Gastroenterology

## 2019-11-28 ENCOUNTER — Other Ambulatory Visit: Payer: Self-pay

## 2019-11-28 ENCOUNTER — Telehealth: Payer: Self-pay

## 2019-11-28 DIAGNOSIS — D490 Neoplasm of unspecified behavior of digestive system: Secondary | ICD-10-CM

## 2019-11-28 LAB — MAGNESIUM: Magnesium: 1.8 mg/dL (ref 1.7–2.4)

## 2019-11-28 LAB — BASIC METABOLIC PANEL
Anion gap: 2 — ABNORMAL LOW (ref 5–15)
BUN: 7 mg/dL — ABNORMAL LOW (ref 8–23)
CO2: 19 mmol/L — ABNORMAL LOW (ref 22–32)
Calcium: 7.7 mg/dL — ABNORMAL LOW (ref 8.9–10.3)
Chloride: 117 mmol/L — ABNORMAL HIGH (ref 98–111)
Creatinine, Ser: 0.64 mg/dL (ref 0.61–1.24)
GFR calc Af Amer: >60 mL/min (ref >60)
GFR calc non Af Amer: >60 mL/min (ref >60)
Glucose, Bld: 101 mg/dL — ABNORMAL HIGH (ref 70–99)
Potassium: 3.8 mmol/L (ref 3.5–5.1)
Sodium: 138 mmol/L (ref 135–145)

## 2019-11-28 LAB — CBC
HCT: 27.2 % — ABNORMAL LOW (ref 39.0–52.0)
Hemoglobin: 9.6 g/dL — ABNORMAL LOW (ref 13.0–17.0)
MCH: 33.1 pg (ref 26.0–34.0)
MCHC: 35.3 g/dL (ref 30.0–36.0)
MCV: 93.8 fL (ref 80.0–100.0)
Platelets: 167 10*3/uL (ref 150–400)
RBC: 2.9 MIL/uL — ABNORMAL LOW (ref 4.22–5.81)
RDW: 17.5 % — ABNORMAL HIGH (ref 11.5–15.5)
WBC: 7 10*3/uL (ref 4.0–10.5)
nRBC: 0 % (ref 0.0–0.2)

## 2019-11-28 LAB — PHOSPHORUS: Phosphorus: 2 mg/dL — ABNORMAL LOW (ref 2.5–4.6)

## 2019-11-28 MED ORDER — POTASSIUM & SODIUM PHOSPHATES 280-160-250 MG PO PACK
1.0000 | PACK | Freq: Three times a day (TID) | ORAL | Status: DC
  Administered 2019-11-28 (×2): 1 via ORAL
  Filled 2019-11-28 (×4): qty 1

## 2019-11-28 MED ORDER — FERROUS SULFATE 325 (65 FE) MG PO TABS: 325.0000 mg | ORAL_TABLET | Freq: Every day | ORAL | 0 refills | Status: DC

## 2019-11-28 MED ORDER — CYANOCOBALAMIN 1000 MCG/ML IJ SOLN
1000.0000 ug | Freq: Once | INTRAMUSCULAR | Status: AC
  Administered 2019-11-28: 1000 ug via INTRAMUSCULAR
  Filled 2019-11-28: qty 1

## 2019-11-28 MED ORDER — PANTOPRAZOLE SODIUM 40 MG PO TBEC: 40.0000 mg | DELAYED_RELEASE_TABLET | Freq: Two times a day (BID) | ORAL | 0 refills | Status: AC

## 2019-11-28 NOTE — Telephone Encounter (Signed)
Patients cath was canceled pending further evaluation.

## 2019-11-28 NOTE — Telephone Encounter (Signed)
Noted. Thank you for the update.

## 2019-11-28 NOTE — Evaluation (Signed)
Physical Therapy Evaluation Patient Details Name: Vernon Patterson MRN: [id number] DOB: 04/23/1956 Today's Date: 11/28/2019   History of Present Illness  Vernon Patterson is a 64 y/o male who was admitted for GI bleeding with chief complaints of acute onset of fatigue and tiredness since Fri. July 9th, with diminished appetite, possible dark sttols, N/V, and maroon color vomitus. 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 PMH includes CAD, depression, HTN, anxiety, heart murmur, and history of alcohol use and smoker.  Clinical Impression  Pt lying in bed upon arrival to room and agreeable to participate in PT. Pt's daughters arrived during session and remained present throughout eval. Pt reported feeling bloated in his abdomen. Pt mod I with bed mobility and supervision for sit <> stand transfers. Pt ambulated a total of 300 feet and performed balance activities in standing. Pt ambulated short distance with RW and ambulated without AD. Noted LOB to both L and R when performing dynamic head movements with ambulation requiring min-mod A for balance. At this time, recommend RW for ambulation. Pt would benefit from continued skilled PT to address strength and balance deficits and HHPT at discharge to optimize return to PLOF and maximize functional mobility.    Follow Up Recommendations Home health PT    Equipment Recommendations  Rolling walker with 5" wheels;3in1 (PT)    Recommendations for Other Services       Precautions / Restrictions Precautions Precautions: Fall Restrictions Weight Bearing Restrictions: No      Mobility  Bed Mobility Overal bed mobility: Modified Independent             General bed mobility comments: required increased time to perform  Transfers Overall transfer level: Needs assistance Equipment used: None Transfers: Sit to/from Stand Sit to Stand: Supervision         General transfer comment: SBA  for sit <> stand transfer for safety. Pt slightly unsteady with initial standing requiring CGA for balance.  Ambulation/Gait Ambulation/Gait assistance: Min guard Gait Distance (Feet): 300 Feet Assistive device: Rolling walker (2 wheeled);None Gait Pattern/deviations: Step-through pattern;Narrow base of support Gait velocity: 10' in 7"   General Gait Details: Initially pt utilized RW and with no LOB and SBA/CGA for safety. Ambulated without AD and pt with decreased head movements noted with narrow BOS.   Stairs            Wheelchair Mobility    Modified Rankin (Stroke Patients Only)       Balance Overall balance assessment: Needs assistance Sitting-balance support: Feet supported Sitting balance-Leahy Scale: Good Sitting balance - Comments: no noted LOB while seated EOB   Standing balance support: No upper extremity supported Standing balance-Leahy Scale: Fair Standing balance comment: pt with good balance with CGA however with head movements, pt noted to lose balance to both L and R requiring min-mod A for steadying                             Pertinent Vitals/Pain Pain Assessment: Faces Faces Pain Scale: Hurts little more Pain Location: belly Pain Descriptors / Indicators:  (bloating) Pain Intervention(s): Monitored during session;Limited activity within patient's tolerance    Home Living Family/patient expects to be discharged to:: Private residence Living Arrangements: Alone Available Help at Discharge: Family;Available 24 hours/day;Neighbor;Available PRN/intermittently Type of Home: House Home Access: Stairs to enter Entrance Stairs-Rails: Right Entrance Stairs-Number of Steps: 2 Home Layout: One level Home  Equipment: Walker - standard Additional Comments: Pt initially reporting no rails in bathroom then later changed and reported he does have them. Daughters unclear of correct answer. Daughters report that they will be available to assist pt at  d/c and that he has neighbors that can come PRN.    Prior Function Level of Independence: Independent         Comments: Pt reports independence with ambulation and transfers. Pt reports having someone come to clean his clothes and daughters report that pt will wear clothes for a month at a time and chooses to not bathe frequently. He reports driving and working prior to admission. Pt denies fall history.     Hand Dominance        Extremity/Trunk Assessment   Upper Extremity Assessment Upper Extremity Assessment: Generalized weakness (generally 3+ to 4-/5 bilaterally)    Lower Extremity Assessment Lower Extremity Assessment: Generalized weakness (generally 4-/5 bilaterally)       Communication   Communication: No difficulties  Cognition Arousal/Alertness: Awake/alert Behavior During Therapy: WFL for tasks assessed/performed Overall Cognitive Status: Within Functional Limits for tasks assessed                                 General Comments: Pt A&O x 4      General Comments      Exercises Other Exercises Other Exercises: activities performed in standing with dynamic head movements Other Exercises: sit <> stand x 5 in 28.30 sec   Assessment/Plan    PT Assessment Patient needs continued PT services  PT Problem List Decreased strength;Decreased activity tolerance;Decreased balance;Decreased mobility;Decreased safety awareness;Pain       PT Treatment Interventions DME instruction;Gait training;Stair training;Functional mobility training;Therapeutic activities;Therapeutic exercise;Balance training;Patient/family education    PT Goals (Current goals can be found in the Care Plan section)  Acute Rehab PT Goals Patient Stated Goal: to go home PT Goal Formulation: With patient Time For Goal Achievement: 12/12/19 Potential to Achieve Goals: Good Additional Goals Additional Goal #1: Pt will perform bed mobility and transfers independently. (transfers with  LRAD; to return to PLOF)    Frequency Min 2X/week   Barriers to discharge        Co-evaluation               AM-PAC PT "6 Clicks" Mobility  Outcome Measure Help needed turning from your back to your side while in a flat bed without using bedrails?: None Help needed moving from lying on your back to sitting on the side of a flat bed without using bedrails?: A Little Help needed moving to and from a bed to a chair (including a wheelchair)?: A Little Help needed standing up from a chair using your arms (e.g., wheelchair or bedside chair)?: A Little Help needed to walk in hospital room?: A Little Help needed climbing 3-5 steps with a railing? : A Little 6 Click Score: 19    End of Session Equipment Utilized During Treatment: Gait belt Activity Tolerance: Patient tolerated treatment well Patient left: in chair;with [place] bell/phone within reach;with chair alarm set;with family/visitor present (MD present) Nurse Communication: Mobility status PT Visit Diagnosis: Unsteadiness on feet (R26.81);Muscle weakness (generalized) (M62.81);Pain Pain - part of body:  (abdomen)    Time: 0960-4540 PT Time Calculation (min) (ACUTE ONLY): 36 min   Charges:              Vale Haven, SPT  Vale Haven 11/28/2019, 1:22 PM

## 2019-11-28 NOTE — Telephone Encounter (Signed)
-----   Message from Earlie Server, MD sent at 11/27/2019  2:58 PM EDT ----- Regarding: post hospitalization follow up for GI tumor This patient is likely being discharged on 11/28/2019.  Please arrange him to get outpatient PET ASAP- gastric tumor, also schedule him to follow up with me around 12/01/2019. Thanks. MD only.

## 2019-11-28 NOTE — Telephone Encounter (Signed)
Daughter returned Vernon Patterson stating that he is in the hospital and they are doing scan this week. She states they will Sposito back once that has been done to reschedule his heart cath. She had no further questions at this time.

## 2019-11-28 NOTE — Telephone Encounter (Signed)
Done PET has been sched 1st avail is 12/07/19 @12  I was unable to reach pt by phone. Pt is scheduled to RTC on 12/01/19 @ 2:00 I'll make sure he gets his appt letter then and I was also mail one out to him

## 2019-11-28 NOTE — Progress Notes (Signed)
   11/28/19 1025  Clinical Encounter Type  Visited With Patient;Family  Visit Type Follow-up;Spiritual support;Social support  Referral From Nurse  Consult/Referral To Chaplain  Responded to PG to finish up AD. Ch went to room and talked to family. Ch got two witnesses and a Patent examiner. AD was finished up. Pt family asked Ch to pray for Pt. Ch prayed for Pt and family members. Ch will follow-up.

## 2019-11-28 NOTE — Telephone Encounter (Signed)
-----   Message from Minna Merritts, MD sent at 11/27/2019 11:57 AM EDT ----- We can delay cath, F/u in clinic after he has seen GI, in several weeks Thx TG ----- Message ----- From: Sherren Mocha, MD Sent: 11/27/2019  10:38 AM EDT To: Minna Merritts, MD, Arvil Chaco, PA-C  Tim and Jacquelyn: his EGD yesterday showed gastric and probably esophageal CA. He is going to have an outpatient PET and await biopsy results from yesterday. Plan is for DC tomorrow. I recommended that cath be cancelled and that he have OP cards FU in 3-4 weeks. Should give time to figure out plan for cancer treatment. If he has extensive stage CA wouldn't do anything with his AS. If he needs big GI surgery, would consider TAVR first. Suspect news won't be good for him.   Thx - Coop

## 2019-11-28 NOTE — Progress Notes (Signed)
Hematology/Oncology Progress Note [facility] [phone] [phone]  Patient Care Team: Albina Billet, MD as PCP - General (Internal Medicine) Dionisio David, MD (Cardiology) Clent Jacks, RN as Oncology Nurse Navigator   Name of the patient: Vernon Patterson  [id number]  [birth year]  Date of visit: 11/28/19   INTERVAL HISTORY-  No nausea vomiting and he is eating his lunch. 2 daughter's are at bedside.   Review of systems- Review of Systems  Constitutional: Positive for fatigue and unexpected weight change. Negative for appetite change.  Respiratory: Negative for cough.   Gastrointestinal: Positive for constipation. Negative for abdominal pain and nausea.  Genitourinary: Negative for dysuria.   Musculoskeletal: Negative for back pain.  Skin: Negative for rash.  Neurological: Negative for headaches.  Hematological: Bruises/bleeds easily.  Psychiatric/Behavioral: Negative for confusion.    No Known Allergies  Patient Active Problem List   Diagnosis Date Noted  . Hypomagnesemia   . Cough   . Gastric tumor   . Piriform sinus tumor   . Hypokalemia   . GI bleeding 11/23/2019  . CAD (coronary artery disease) 04/18/2013     Past Medical History:  Diagnosis Date  . Anxiety   . Coronary artery disease   . Depression   . Heart murmur   . Hypertension      Past Surgical History:  Procedure Laterality Date  . CARDIAC CATHETERIZATION    . CORONARY ARTERY BYPASS GRAFT    . CORONARY ARTERY BYPASS GRAFT N/A 04/18/2013   Procedure: CORONARY ARTERY BYPASS GRAFTING (CABG);  Surgeon: Gaye Pollack, MD;  Location: [place];  Service: Open Heart Surgery;  Laterality: N/A;  Times  3  using left internal mammary artery and endoscopically harvested right saphenous vein  . ENDOVEIN HARVEST OF GREATER SAPHENOUS VEIN Right 04/18/2013   Procedure: ENDOVEIN HARVEST OF GREATER SAPHENOUS VEIN;  Surgeon: Gaye Pollack, MD;  Location: [place];   Service: Open Heart Surgery;  Laterality: Right;  Some of the saphenous vein from the lower leg also used.  . ESOPHAGOGASTRODUODENOSCOPY (EGD) WITH PROPOFOL N/A 11/26/2019   Procedure: ESOPHAGOGASTRODUODENOSCOPY (EGD) WITH PROPOFOL;  Surgeon: Lin Landsman, MD;  Location: [place];  Service: Gastroenterology;  Laterality: N/A;  . INTRAOPERATIVE TRANSESOPHAGEAL ECHOCARDIOGRAM N/A 04/18/2013   Procedure: INTRAOPERATIVE TRANSESOPHAGEAL ECHOCARDIOGRAM;  Surgeon: Gaye Pollack, MD;  Location: [place] [facility];  Service: Open Heart Surgery;  Laterality: N/A;    Social History   Socioeconomic History  . Marital status: Married    Spouse name: Not on file  . Number of children: Not on file  . Years of education: Not on file  . Highest education level: Not on file  Occupational History  . Not on file  Tobacco Use  . Smoking status: Current Every Day Smoker    Types: Cigars  . Smokeless tobacco: Never Used  . Tobacco comment: 2-3 cigars/day  Substance and Sexual Activity  . Alcohol use: Yes    Comment: 5 bottles of wine/week  . Drug use: No  . Sexual activity: Never  Other Topics Concern  . Not on file  Social History Narrative  . Not on file   Social Determinants of Health   Financial Resource Strain:   . Difficulty of Paying Living Expenses:   Food Insecurity:   . Worried About Charity fundraiser in the Last Year:   . Arboriculturist in the Last Year:   Transportation Needs:   . Lack of Transportation (  Medical):   Marland Kitchen Lack of Transportation (Non-Medical):   Physical Activity:   . Days of Exercise per Week:   . Minutes of Exercise per Session:   Stress:   . Feeling of Stress :   Social Connections:   . Frequency of Communication with Friends and Family:   . Frequency of Social Gatherings with Friends and Family:   . Attends Religious Services:   . Active Member of Clubs or Organizations:   . Attends Archivist Meetings:   Marland Kitchen Marital Status:   Intimate Partner  Violence:   . Fear of Current or Ex-Partner:   . Emotionally Abused:   Marland Kitchen Physically Abused:   . Sexually Abused:      History reviewed. No pertinent family history.   Current Facility-Administered Medications:  .  0.9 %  sodium chloride infusion, , Intravenous, Continuous, Mansy, Jan A, MD, Last Rate: 100 mL/hr at 11/28/19 0655, New Bag at 11/28/19 0655 .  acetaminophen (TYLENOL) tablet 650 mg, 650 mg, Oral, Q6H PRN, 650 mg at 11/28/19 0849 **OR** acetaminophen (TYLENOL) suppository 650 mg, 650 mg, Rectal, Q6H PRN, Mansy, Jan A, MD .  feeding supplement (ENSURE ENLIVE) (ENSURE ENLIVE) liquid 237 mL, 237 mL, Oral, TID, Wyvonnia Dusky, MD, 237 mL at 11/27/19 2008 .  ferrous sulfate tablet 325 mg, 325 mg, Oral, Q breakfast, Wyvonnia Dusky, MD, 325 mg at 11/28/19 0850 .  metoprolol tartrate (LOPRESSOR) tablet 25 mg, 25 mg, Oral, Daily, Mansy, Jan A, MD, 25 mg at 11/28/19 0856 .  multivitamin with minerals tablet 1 tablet, 1 tablet, Oral, Daily, Wyvonnia Dusky, MD, 1 tablet at 11/28/19 0849 .  ondansetron (ZOFRAN) tablet 4 mg, 4 mg, Oral, Q6H PRN **OR** ondansetron (ZOFRAN) injection 4 mg, 4 mg, Intravenous, Q6H PRN, Mansy, Jan A, MD .  pantoprazole (PROTONIX) EC tablet 40 mg, 40 mg, Oral, BID, Wyvonnia Dusky, MD, 40 mg at 11/28/19 0850 .  potassium & sodium phosphates (PHOS-NAK) 280-160-250 MG packet 1 packet, 1 packet, Oral, TID WC & HS, Wyvonnia Dusky, MD, 1 packet at 11/28/19 1249 .  traZODone (DESYREL) tablet 50 mg, 50 mg, Oral, QHS, Mansy, Jan A, MD, 50 mg at 11/25/19 2118 .  vitamin B-12 (CYANOCOBALAMIN) tablet 1,000 mcg, 1,000 mcg, Oral, Daily, Jonathon Bellows, MD, 1,000 mcg at 11/28/19 0850 .  zolpidem (AMBIEN) tablet 5 mg, 5 mg, Oral, QHS PRN, Mansy, Jan A, MD, 5 mg at 11/27/19 2003  Facility-Administered Medications Ordered in Other Encounters:  .  [START ON 12/01/2019] sodium chloride flush (NS) 0.9 % injection 3 mL, 3 mL, Intravenous, Q12H, Arvil Chaco,  [place]   Physical exam:  Vitals:   11/27/19 2157 11/28/19 0539 11/28/19 0856 11/28/19 1249  BP: (!) 91/58 (!) 143/76 (!) 145/94 111/62  Pulse: (!) 59 60 (!) 58 (!) 52  Resp: 20 18  18   Temp:  98.1 F (36.7 C)  97.9 F (36.6 C)  TempSrc:  Oral  Oral  SpO2: 96% 97%  99%  Weight:      Height:       Physical Exam Constitutional:      General: He is not in acute distress.    Appearance: He is not diaphoretic.  HENT:     Head: Normocephalic and atraumatic.     Nose: Nose normal.     Mouth/Throat:     Pharynx: No oropharyngeal exudate.  Eyes:     General: No scleral icterus.    Pupils: Pupils are equal, round,  and reactive to light.  Cardiovascular:     Rate and Rhythm: Normal rate and regular rhythm.     Heart sounds: No murmur heard.   Pulmonary:     Effort: Pulmonary effort is normal. No respiratory distress.     Breath sounds: No rales.  Chest:     Chest wall: No tenderness.  Abdominal:     General: There is no distension.     Palpations: Abdomen is soft.     Tenderness: There is no abdominal tenderness.  Musculoskeletal:        General: Normal range of motion.     Cervical back: Normal range of motion and neck supple.  Skin:    General: Skin is warm and dry.     Findings: No erythema.  Neurological:     Mental Status: He is alert and oriented to person, place, and time.     Cranial Nerves: No cranial nerve deficit.     Motor: No abnormal muscle tone.     Coordination: Coordination normal.  Psychiatric:        Mood and Affect: Affect normal.        CMP Latest Ref Rng & Units 11/28/2019  Glucose 70 - 99 mg/dL 101(H)  BUN 8 - 23 mg/dL 7(L)  Creatinine 0.61 - 1.24 mg/dL 0.64  Sodium 135 - 145 mmol/L 138  Potassium 3.5 - 5.1 mmol/L 3.8  Chloride 98 - 111 mmol/L 117(H)  CO2 22 - 32 mmol/L 19(L)  Calcium 8.9 - 10.3 mg/dL 7.7(L)  Total Protein 6.5 - 8.1 g/dL -  Total Bilirubin 0.3 - 1.2 mg/dL -  Alkaline Phos 38 - 126 U/L -  AST 15 - 41 U/L -  ALT 0 - 44  U/L -   CBC Latest Ref Rng & Units 11/28/2019  WBC 4.0 - 10.5 K/uL 7.0  Hemoglobin 13.0 - 17.0 g/dL 9.6(L)  Hematocrit 39 - 52 % 27.2(L)  Platelets 150 - 400 K/uL 167    RADIOGRAPHIC STUDIES: I have personally reviewed the radiological images as listed and agreed with the findings in the report. DG Chest 2 View  Result Date: 11/26/2019 CLINICAL DATA:  Cough and weakness EXAM: CHEST - 2 VIEW COMPARISON:  Chest radiograph 10/19/2019 FINDINGS: Stable cardiomediastinal contours status post median sternotomy. Minimal atelectasis at the right lung base. No focal consolidation. On lateral view there is a 9 mm nodule inferiorly which is not well seen on the frontal projection. No pneumothorax or pleural effusion. No acute finding in the visualized skeleton. IMPRESSION: 1. No evidence of active disease in the chest. 2. 9 mm nodule inferiorly on the lateral view which is not well seen on the frontal projection. Consider nonemergent chest CT for further evaluation. Electronically Signed   By: Audie Pinto M.D.   On: 11/26/2019 17:12   ECHOCARDIOGRAM COMPLETE  Result Date: 11/25/2019    ECHOCARDIOGRAM REPORT   Patient Name:   MICHEL HENDON Date of Exam: 11/25/2019 Medical Rec #:  [id number]      Height:       68.0 in Accession #:    [id number]     Weight:       129.9 lb Date of Birth:  Apr 21, 1956      BSA:          1.701 m Patient Age:    64 years       BP:           143/81 mmHg Patient Gender: M  HR:           58 bpm. Exam Location:  [facility] Procedure: 2D Echo, Color Doppler and Cardiac Doppler Indications:     R01.2 Abnormal Heart Sounds Nec  History:         Patient has no prior history of Echocardiogram examinations.                  CAD; Prior CABG.  Sonographer:     Charmayne Sheer RDCS (AE) Referring Phys:  [id number] Arvil Chaco Diagnosing Phys: Ida Rogue MD IMPRESSIONS  1. Left ventricular ejection fraction, by estimation, is 60 to 65%. The left ventricle has normal function. The left  ventricle has no regional wall motion abnormalities. Left ventricular diastolic parameters are consistent with Grade II diastolic dysfunction (pseudonormalization).  2. Right ventricular systolic function is normal. The right ventricular size is normal. There is mildly elevated pulmonary artery systolic pressure.  3. Left atrial size was mildly dilated.  4. The aortic valve is heavily calcified. Aortic valve regurgitation is mild. Severe aortic valve stenosis. Aortic valve area, by VTI measures 0.86 cm. Aortic valve mean gradient measures 38.0 mmHg. Aortic valve Vmax measures 4.02 m/s. FINDINGS  Left Ventricle: Left ventricular ejection fraction, by estimation, is 60 to 65%. The left ventricle has normal function. The left ventricle has no regional wall motion abnormalities. The left ventricular internal cavity size was normal in size. There is  no left ventricular hypertrophy. Left ventricular diastolic parameters are consistent with Grade II diastolic dysfunction (pseudonormalization). Right Ventricle: The right ventricular size is normal. No increase in right ventricular wall thickness. Right ventricular systolic function is normal. There is mildly elevated pulmonary artery systolic pressure. The tricuspid regurgitant velocity is 2.53  m/s, and with an assumed right atrial pressure of 10 mmHg, the estimated right ventricular systolic pressure is 29.5 mmHg. Left Atrium: Left atrial size was mildly dilated. Right Atrium: Right atrial size was normal in size. Pericardium: There is no evidence of pericardial effusion. Mitral Valve: The mitral valve is normal in structure. There is mild thickening of the mitral valve leaflet(s). Normal mobility of the mitral valve leaflets. Mild mitral valve regurgitation. No evidence of mitral valve stenosis. MV peak gradient, 4.2 mmHg. The mean mitral valve gradient is 2.0 mmHg. Tricuspid Valve: The tricuspid valve is normal in structure. Tricuspid valve regurgitation is not  demonstrated. No evidence of tricuspid stenosis. Aortic Valve: The aortic valve is normal in structure. Aortic valve regurgitation is mild. Severe aortic stenosis is present. Aortic valve mean gradient measures 38.0 mmHg. Aortic valve peak gradient measures 64.6 mmHg. Aortic valve area, by VTI measures  0.86 cm. Pulmonic Valve: The pulmonic valve was normal in structure. Pulmonic valve regurgitation is not visualized. No evidence of pulmonic stenosis. Aorta: The aortic root is normal in size and structure. Venous: The inferior vena cava is normal in size with greater than 50% respiratory variability, suggesting right atrial pressure of 3 mmHg. IAS/Shunts: No atrial level shunt detected by color flow Doppler.  LEFT VENTRICLE PLAX 2D LVIDd:         3.94 cm  Diastology LVIDs:         2.13 cm  LV e' lateral:   9.90 cm/s LV PW:         1.26 cm  LV E/e' lateral: 10.4 LV IVS:        1.13 cm  LV e' medial:    5.22 cm/s LVOT diam:     2.40 cm  LV  E/e' medial:  19.7 LV SV:         89 LV SV Index:   52 LVOT Area:     4.52 cm  RIGHT VENTRICLE RV Basal diam:  3.15 cm LEFT ATRIUM             Index       RIGHT ATRIUM           Index LA diam:        4.20 cm 2.47 cm/m  RA Area:     12.70 cm LA Vol (A2C):   62.5 ml 36.74 ml/m RA Volume:   27.80 ml  16.34 ml/m LA Vol (A4C):   48.2 ml 28.33 ml/m LA Biplane Vol: 55.2 ml 32.45 ml/m  AORTIC VALVE                    PULMONIC VALVE AV Area (Vmax):    0.82 cm     PV Vmax:       2.01 m/s AV Area (Vmean):   0.95 cm     PV Vmean:      135.000 cm/s AV Area (VTI):     0.86 cm     PV VTI:        0.470 m AV Vmax:           402.00 cm/s  PV Peak grad:  16.2 mmHg AV Vmean:          247.500 cm/s PV Mean grad:  9.0 mmHg AV VTI:            1.040 m AV Peak Grad:      64.6 mmHg AV Mean Grad:      38.0 mmHg LVOT Vmax:         72.50 cm/s LVOT Vmean:        51.900 cm/s LVOT VTI:          0.197 m LVOT/AV VTI ratio: 0.19  AORTA Ao Root diam: 3.80 cm MITRAL VALVE                TRICUSPID VALVE MV Area  (PHT): 4.04 cm     TR Peak grad:   25.6 mmHg MV Peak grad:  4.2 mmHg     TR Vmax:        253.00 cm/s MV Mean grad:  2.0 mmHg MV Vmax:       1.02 m/s     SHUNTS MV Vmean:      58.1 cm/s    Systemic VTI:  0.20 m MV Decel Time: 188 msec     Systemic Diam: 2.40 cm MV E velocity: 103.00 cm/s MV A velocity: 45.00 cm/s MV E/A ratio:  2.29 Ida Rogue MD Electronically signed by Ida Rogue MD Signature Date/Time: 11/25/2019/2:19:12 PM    Final     Assessment and plan-  Patient is a 64 y.o. male with chronic history of alcohol use, smoker, CAD, hypertension, depression presents for evaluation of profound weakness, blood in the stool, nausea and vomiting. 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 Awaiting pathology results.  Continue supportive care, antiemetics, PPI, liquid diet. Small lung nodule,  outpatient PET scan for further evaluation and staging. CEA is pending.   # Left piriform mass, has been seen by ENT.  He will follow-up outpatient with Dr. Kathyrn Sheriff  for laryngoscopy  #Normocytic anemia with hemoglobin  9 Folate normal.  Vitamin B12 is at low normal end.  He is started on oral vitamin B12 supplementation. Repeat B12 outpatient.   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 Recent ultrasound abdomen 10/19/2019 showed increased echogenicity with liver likely fatty infiltration. PT 17.5, PTT 39.   # systolic heart murmur, echo showed LVEF 60-65%. Diastolic dysfunction. Aortic stenosis.  Follow-up with cardiology.  Cardiology note was reviewed.  Depending on his cancer staging work-up, he may proceed with further work-up versus conservative management.  #Hypokalemia,resolved.  Hypomagnesemia,resolved.   From oncology aspect, patient can be discharged for outpatient follow-up.  Our office will  contact patient to schedule him for outpatient PET scan and outpatient follow-up to discuss pathology report.  Thank you for allowing me to participate in the care of this patient.   Earlie Server, MD, PhD Hematology Oncology [facility] Pager- [phone] 11/28/2019

## 2019-11-28 NOTE — TOC Transition Note (Signed)
Transition of Care [facility]) - CM/SW Discharge Note   Patient Details  Name: Vernon Patterson MRN: [id number] Date of Birth: 03/15/1956  [facility] Care [facility] CM/SW Contact:  Candie Chroman, LCSW Phone Number: 11/28/2019, 1:03 PM   Clinical Narrative: CSW met with patient. Daughters at bedside. CSW introduced role and explained that PT recommendations would be discussed. Patient is not interested in home health at this time. CSW provided CMS scores for agencies that serve his zip code and encouraged him to contact his PCP if he changes his mind. Discussed DME recommendations. He already has a walker at home and he declined the 3-in-1. No further concerns. Patient has orders to discharge home today. CSW signing off.    Final next level of care: Home/Self Care Barriers to Discharge: No Barriers Identified   Patient Goals and CMS Choice   CMS Medicare.gov Compare Post Acute Care list provided to:: Patient (Daughters at bedside.)    Discharge Placement                       Discharge Plan and Services     Post Acute Care Choice: NA                               Social Determinants of Health (SDOH) Interventions     Readmission Risk Interventions No flowsheet data found.

## 2019-11-28 NOTE — Care Management Important Message (Signed)
Important Message  Patient Details  Name: Vernon Patterson MRN: [id number] Date of Birth: 1955/06/16   Medicare Important Message Given:  Yes     Dannette Barbara 11/28/2019, 12:31 PM

## 2019-11-28 NOTE — Discharge Summary (Signed)
Physician Discharge Summary  Nainoa Woldt Haith ATF:[id number] DOB: 31-Jan-1956 DOA: 11/23/2019  PCP: Albina Billet, MD  Admit date: 11/23/2019 Discharge date: 11/28/2019  Admitted From: home Disposition:  home  Recommendations for Outpatient Follow-up:  1. Follow up with PCP in 1-2 weeks 2. F/u onco, Dr. Tasia Catchings, w/in 1 week 3. F/ cardio, Dr. Burt Knack, in 3-4 weeks  4. F/u ENT, Dr. Kathyrn Sheriff, in 1-2 weeks   Home Health: no as pt refused  Equipment/Devices: pt refused   Discharge Condition: stable CODE STATUS: full  Diet recommendation: Heart Healthy   Brief/Interim Summary: HPI was taken from Dr. Sidney Ace: Legrand Como Saliba  is a 64 y.o. Caucasian male with a known history of coronary artery disease, depression, hypertension and anxiety, who presented to the emergency room with acute onset of fatigue and tiredness since Friday with significant diminished appetite.  He noticed to big dark stools and had nausea and vomiting.  He believes he may have seen a maroon color vomitus last night that could have been a clot.  He denied any significant abdominal pain.  No chest pain or dyspnea or cough or wheezing.  No fever or chills.  No hematuria or hemoptysis or other bleeding diathesis.  He takes 2 aspirin daily.  Upon presentation to the emergency room, vital signs were within normal except for borderline blood pressure of 97/67.  Labs revealed borderline potassium of 3.5 and a high-sensitivity troponin I of 75 and later 70.  CBC showed anemia with hemoglobin of 7.4 hematocrit 22 compared to 9.1 and 26.1 on 04/21/2013.  COVID-19 PCR came back negative and EKG showed sinus rhythm with a rate of 85 with PVCs and T wave inversion laterally and inferiorly.  He had positive stool Hemoccult by the ER physician.  The patient was given IV Protonix bolus and drip 1 L bolus of IV normal saline and was typed and crossmatched and was being transfused packed red blood cells.  He will be admitted to a progressive unit bed for  further evaluation and management.  Hospital Course from Dr. 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 Path was pending at the time of d/c. Oncology was consulted and recommend pt get a PET scan an outpatient to help with staging. Also, ENT saw the pt for the left piriform sinus mass and pt will f/u outpatient after to determine if the piriform sinus still needs to be biopsied after above stated path has resulted. Pt smokes cigars daily and drinks alcohol often. Furthermore, pt was found to have loud systolic murmur on exam and an echo was done which showed severe aortic stenosis. Pt will f/u outpatient w/ cardio, Dr. Burt Knack, in 3-4 weeks to determine if pt will be managed conservatively or will need cardiac cath & TAVR. All of the above and below stated information was discussed w/ the pt and pt's daughter, Miquel Dunn and they verbalized their understanding. For more information, please see other progress notes    Discharge Diagnoses:  Active Problems:   GI bleeding   Hypomagnesemia   Cough   Gastric tumor   Piriform sinus tumor   Hypokalemia  GI bleeding: etiology unclear.  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 Path pending. Continue on protonix. Advanced diet. Continue to hold aspirin. GI following and recs apprec.  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 Will decide as an outpatient if mass needs to biopsied or not based on GE junction mass biopsy results and PET scan results. ENT recs apprec. Smokes cigars daily   Alcohol abuse: alcohol cessation counseling. Pt states if it "makes me feel good, I'll continue to drink it". Started on vitamin B12 as level is low end of normal  Hypokalemia: WNL today.  Will continue to monitor  Severe aortic stenosis: as per echo. Will hold off on cardiac cath next week until staging is done. Will f/u w/ cardio in 3-4 weeks as an outpatient.   Elevated troponins: no chest pain. Trending down. Likely due to demand ischemia   Acute blood loss anemia: secondary GE junction tumor, likely malignant as well as iron deficiency anemia. S/p 2 units of pRBCs. H&H are stable. Started on iron supplements. Will continue to monitor H&H  Hypertension: continue on metoprolol   Severe protein calorie nutrition: will continue ensure.   Failure to thrive: likely secondary to possible malignancy. Pt has difficulty completing ADLs & has had recent significant weight loss. Pt evidently survived the last 2 weeks on ice cream only.    Discharge Instructions  Discharge Instructions    Diet - low sodium heart healthy   Complete by: As directed    Discharge instructions   Complete by: As directed    F/u PCP in 1-2 weeks. F/u w/ oncology, Dr. Tasia Catchings, within 1 week. F/u cardio, Dr. Burt Knack, in 3-4 weeks   Increase activity slowly   Complete by: As directed      Allergies as of 11/28/2019   No Known Allergies     Medication List    STOP taking these medications   aspirin 325 MG EC tablet     TAKE these medications   ferrous sulfate 325 (65 FE) MG tablet Take 1 tablet (325 mg total) by mouth daily with breakfast. Start taking on: November 29, 2019   metoprolol tartrate 25 MG tablet Commonly known as: LOPRESSOR Take 25 mg by mouth daily.   pantoprazole 40 MG tablet Commonly known as: PROTONIX Take 1 tablet (40 mg total) by mouth 2 (two) times daily.   traZODone 50 MG tablet Commonly known as: DESYREL Take 50 mg by mouth at bedtime.            Durable Medical Equipment  (From admission, onward)         Start     Ordered   11/28/19 0957  For home use only DME 3 n 1  Once        11/28/19 0956   11/28/19 0957  For home use only DME Walker rolling  Once        Question Answer Comment  Walker: With [place] Wheels   Patient needs a walker to treat with the following condition Generalized weakness      11/28/19 0956          Follow-up Information    Earlie Server, MD Follow up in 1 week(s).   Specialty: Oncology Why: f/u w/ Dr. Tasia Catchings within 1 week. Dr. Tasia Catchings will arrange for your PET scan outpatient  Contact information: [place] [address] [phone]        Sherren Mocha, MD Follow up in 4 week(s).   Specialty: Cardiology Why: f/u in 3-4 weeks  Contact information: [place] [address] [phone]  Albina Billet, MD Follow up in 2 week(s).   Specialty: Internal Medicine Contact information: [address] [place] [address] [phone]        Margaretha Sheffield, MD Follow up in 2 week(s).   Specialty: Otolaryngology Why: f/u w/ ENT in 1-2 weeks Contact information: [place]. [place] [address] [phone]              No Known Allergies  Consultations:  ENT  GI  Onco  Cardio   Procedures/Studies: DG Chest 2 View  Result Date: 11/26/2019 CLINICAL DATA:  Cough and weakness EXAM: CHEST - 2 VIEW COMPARISON:  Chest radiograph 10/19/2019 FINDINGS: Stable cardiomediastinal contours status post median sternotomy. Minimal atelectasis at the right lung base. No focal consolidation. On lateral view there is a 9 mm nodule inferiorly which is not well seen on the frontal projection. No pneumothorax or pleural effusion. No acute finding in the visualized skeleton. IMPRESSION: 1. No evidence of active disease in the chest. 2. 9 mm nodule inferiorly on the lateral view which is not well seen on the frontal projection. Consider nonemergent chest CT for further evaluation. Electronically Signed   By: Audie Pinto M.D.   On: 11/26/2019 17:12   ECHOCARDIOGRAM COMPLETE  Result Date: 11/25/2019    ECHOCARDIOGRAM REPORT   Patient  Name:   FREAD KOTTKE Date of Exam: 11/25/2019 Medical Rec #:  [id number]      Height:       68.0 in Accession #:    [id number]     Weight:       129.9 lb Date of Birth:  May 05, 1956      BSA:          1.701 m Patient Age:    64 years       BP:           143/81 mmHg Patient Gender: M              HR:           58 bpm. Exam Location:  [facility] Procedure: 2D Echo, Color Doppler and Cardiac Doppler Indications:     R01.2 Abnormal Heart Sounds Nec  History:         Patient has no prior history of Echocardiogram examinations.                  CAD; Prior CABG.  Sonographer:     Charmayne Sheer RDCS (AE) Referring Phys:  [id number] Arvil Chaco Diagnosing Phys: Ida Rogue MD IMPRESSIONS  1. Left ventricular ejection fraction, by estimation, is 60 to 65%. The left ventricle has normal function. The left ventricle has no regional wall motion abnormalities. Left ventricular diastolic parameters are consistent with Grade II diastolic dysfunction (pseudonormalization).  2. Right ventricular systolic function is normal. The right ventricular size is normal. There is mildly elevated pulmonary artery systolic pressure.  3. Left atrial size was mildly dilated.  4. The aortic valve is heavily calcified. Aortic valve regurgitation is mild. Severe aortic valve stenosis. Aortic valve area, by VTI measures 0.86 cm. Aortic valve mean gradient measures 38.0 mmHg. Aortic valve Vmax measures 4.02 m/s. FINDINGS  Left Ventricle: Left ventricular ejection fraction, by estimation, is 60 to 65%. The left ventricle has normal function. The left ventricle has no regional wall motion abnormalities. The left ventricular internal cavity size was normal in size. There is  no left ventricular hypertrophy. Left ventricular diastolic parameters are consistent with  Grade II diastolic dysfunction (pseudonormalization). Right Ventricle: The right ventricular size is normal. No increase in right ventricular wall thickness. Right ventricular systolic function is  normal. There is mildly elevated pulmonary artery systolic pressure. The tricuspid regurgitant velocity is 2.53  m/s, and with an assumed right atrial pressure of 10 mmHg, the estimated right ventricular systolic pressure is 78.2 mmHg. Left Atrium: Left atrial size was mildly dilated. Right Atrium: Right atrial size was normal in size. Pericardium: There is no evidence of pericardial effusion. Mitral Valve: The mitral valve is normal in structure. There is mild thickening of the mitral valve leaflet(s). Normal mobility of the mitral valve leaflets. Mild mitral valve regurgitation. No evidence of mitral valve stenosis. MV peak gradient, 4.2 mmHg. The mean mitral valve gradient is 2.0 mmHg. Tricuspid Valve: The tricuspid valve is normal in structure. Tricuspid valve regurgitation is not demonstrated. No evidence of tricuspid stenosis. Aortic Valve: The aortic valve is normal in structure. Aortic valve regurgitation is mild. Severe aortic stenosis is present. Aortic valve mean gradient measures 38.0 mmHg. Aortic valve peak gradient measures 64.6 mmHg. Aortic valve area, by VTI measures  0.86 cm. Pulmonic Valve: The pulmonic valve was normal in structure. Pulmonic valve regurgitation is not visualized. No evidence of pulmonic stenosis. Aorta: The aortic root is normal in size and structure. Venous: The inferior vena cava is normal in size with greater than 50% respiratory variability, suggesting right atrial pressure of 3 mmHg. IAS/Shunts: No atrial level shunt detected by color flow Doppler.  LEFT VENTRICLE PLAX 2D LVIDd:         3.94 cm  Diastology LVIDs:         2.13 cm  LV e' lateral:   9.90 cm/s LV PW:         1.26 cm  LV E/e' lateral: 10.4 LV IVS:        1.13 cm  LV e' medial:    5.22 cm/s LVOT diam:     2.40 cm  LV E/e' medial:  19.7 LV SV:         89 LV SV Index:   52 LVOT Area:     4.52 cm  RIGHT VENTRICLE RV Basal diam:  3.15 cm LEFT ATRIUM             Index       RIGHT ATRIUM           Index LA diam:         4.20 cm 2.47 cm/m  RA Area:     12.70 cm LA Vol (A2C):   62.5 ml 36.74 ml/m RA Volume:   27.80 ml  16.34 ml/m LA Vol (A4C):   48.2 ml 28.33 ml/m LA Biplane Vol: 55.2 ml 32.45 ml/m  AORTIC VALVE                    PULMONIC VALVE AV Area (Vmax):    0.82 cm     PV Vmax:       2.01 m/s AV Area (Vmean):   0.95 cm     PV Vmean:      135.000 cm/s AV Area (VTI):     0.86 cm     PV VTI:        0.470 m AV Vmax:           402.00 cm/s  PV Peak grad:  16.2 mmHg AV Vmean:          247.500 cm/s PV Mean grad:  9.0 mmHg AV VTI:            1.040 m AV Peak Grad:      64.6 mmHg AV Mean Grad:      38.0 mmHg LVOT Vmax:         72.50 cm/s LVOT Vmean:        51.900 cm/s LVOT VTI:          0.197 m LVOT/AV VTI ratio: 0.19  AORTA Ao Root diam: 3.80 cm MITRAL VALVE                TRICUSPID VALVE MV Area (PHT): 4.04 cm     TR Peak grad:   25.6 mmHg MV Peak grad:  4.2 mmHg     TR Vmax:        253.00 cm/s MV Mean grad:  2.0 mmHg MV Vmax:       1.02 m/s     SHUNTS MV Vmean:      58.1 cm/s    Systemic VTI:  0.20 m MV Decel Time: 188 msec     Systemic Diam: 2.40 cm MV E velocity: 103.00 cm/s MV A velocity: 45.00 cm/s MV E/A ratio:  2.29 Ida Rogue MD Electronically signed by Ida Rogue MD Signature Date/Time: 11/25/2019/2:19:12 PM    Final      Subjective: Pt c/o fatigue   Discharge Exam: Vitals:   11/28/19 0856 11/28/19 1249  BP: (!) 145/94 111/62  Pulse: (!) 58 (!) 52  Resp:  18  Temp:  97.9 F (36.6 C)  SpO2:  99%   Vitals:   11/27/19 2157 11/28/19 0539 11/28/19 0856 11/28/19 1249  BP: (!) 91/58 (!) 143/76 (!) 145/94 111/62  Pulse: (!) 59 60 (!) 58 (!) 52  Resp: 20 18  18   Temp:  98.1 F (36.7 C)  97.9 F (36.6 C)  TempSrc:  Oral  Oral  SpO2: 96% 97%  99%  Weight:      Height:        General: Pt is alert, awake, not in acute distress Cardiovascular:  T5/V7 +, systolic murmur at least 3/6. No rubs, no gallops Respiratory: diminished breath sounds b/l. No rales Abdominal: Soft, NT, ND, bowel sounds  + Extremities: no cyanosis    The results of significant diagnostics from this hospitalization (including imaging, microbiology, ancillary and laboratory) are listed below for reference.     Microbiology: Recent Results (from the past 240 hour(s))  SARS Coronavirus 2 by RT PCR (hospital order, performed in [facility] hospital lab) Nasopharyngeal Nasopharyngeal Swab     Status: None   Collection Time: 11/23/19  8:10 PM   Specimen: Nasopharyngeal Swab  Result Value Ref Range Status   SARS Coronavirus 2 NEGATIVE NEGATIVE Final    Comment: (NOTE) SARS-CoV-2 target nucleic acids are NOT DETECTED.  The SARS-CoV-2 RNA is generally detectable in upper and lower respiratory specimens during the acute phase of infection. The lowest concentration of SARS-CoV-2 viral copies this assay can detect is 250 copies / mL. A negative result does not preclude SARS-CoV-2 infection and should not be used as the sole basis for treatment or other patient management decisions.  A negative result may occur with improper specimen collection / handling, submission of specimen other than nasopharyngeal swab, presence of viral mutation(s) within the areas targeted by this assay, and inadequate number of viral copies (<250 copies / mL). A negative result must be combined with clinical observations, patient history, and epidemiological information.  Fact Sheet for Patients:  StrictlyIdeas.no  Fact Sheet for Healthcare Providers: BankingDealers.co.za  This test is not yet approved or  cleared by the [place] FDA and has been authorized for detection and/or diagnosis of SARS-CoV-2 by FDA under an Emergency Use Authorization (EUA).  This EUA will remain in effect (meaning this test can be used) for the duration of the COVID-19 declaration under Section 564(b)(1) of the Act, 21 U.S.C. section 360bbb-3(b)(1), unless the authorization is terminated or revoked  sooner.  Performed at [facility], [address] [place], [place] [address]      Labs: BNP (last 3 results) No results for input(s): BNP in the last 8760 hours. Basic Metabolic Panel: Recent Labs  Lab 11/23/19 1819 11/24/19 0513 11/26/19 0840 11/27/19 0357 11/28/19 0535  NA 141 142 139 139 138  K 3.5 3.8 2.8* 3.0* 3.8  CL 107 111 113* 116* 117*  CO2 22 24 21* 19* 19*  GLUCOSE 110* 99 99 84 101*  BUN 15 15 9 8  7*  CREATININE 0.81 0.79 0.61 0.76 0.64  CALCIUM 9.0 8.2* 7.8* 7.5* 7.7*  MG  --   --  1.6* 2.0 1.8  PHOS  --   --   --   --  2.0*   Liver Function Tests: Recent Labs  Lab 11/26/19 0840  AST 21  ALT 13  ALKPHOS 40  BILITOT 0.9  PROT 5.0*  ALBUMIN 2.2*   No results for input(s): LIPASE, AMYLASE in the last 168 hours. No results for input(s): AMMONIA in the last 168 hours. CBC: Recent Labs  Lab 11/23/19 1819 11/23/19 1819 11/24/19 0513 11/24/19 0513 11/24/19 0929 11/24/19 1534 11/26/19 0840 11/27/19 0357 11/28/19 0535  WBC 8.1  --  7.3  --   --   --  7.3 7.0 7.0  HGB 7.4*   < > 9.0*   < > 9.1* 9.9* 9.0* 9.1* 9.6*  HCT 22.0*   < > 26.0*   < > 26.1* 29.0* 27.1* 25.4* 27.2*  MCV 99.5  --  92.9  --   --   --  94.8 91.7 93.8  PLT 213  --  156  --   --   --  164 162 167   < > = values in this interval not displayed.   Cardiac Enzymes: No results for input(s): CKTOTAL, CKMB, CKMBINDEX, TROPONINI in the last 168 hours. BNP: Invalid input(s): POCBNP CBG: No results for input(s): GLUCAP in the last 168 hours. D-Dimer No results for input(s): DDIMER in the last 72 hours. Hgb A1c No results for input(s): HGBA1C in the last 72 hours. Lipid Profile No results for input(s): CHOL, HDL, LDLCALC, TRIG, CHOLHDL, LDLDIRECT in the last 72 hours. Thyroid function studies No results for input(s): TSH, T4TOTAL, T3FREE, THYROIDAB in the last 72 hours.  Invalid input(s): FREET3 Anemia work up Recent Labs    11/26/19 0840 11/27/19 0357   VITAMINB12 299  --   FOLATE  --  8.3  FERRITIN 229  --   TIBC 147*  --   IRON 23*  --   RETICCTPCT 3.3*  --    Urinalysis    Component Value Date/Time   COLORURINE AMBER (A) 11/27/2019 2052   APPEARANCEUR HAZY (A) 11/27/2019 2052   LABSPEC 1.029 11/27/2019 2052   PHURINE 5.0 11/27/2019 2052   [place] NEGATIVE 11/27/2019 2052   HGBUR NEGATIVE 11/27/2019 2052   [place] NEGATIVE 11/27/2019 2052   KETONESUR NEGATIVE 11/27/2019 2052   PROTEINUR 30 (A) 11/27/2019 2052   NITRITE NEGATIVE  11/27/2019 2052   LEUKOCYTESUR NEGATIVE 11/27/2019 2052   Sepsis Labs Invalid input(s): PROCALCITONIN,  WBC,  LACTICIDVEN Microbiology Recent Results (from the past 240 hour(s))  SARS Coronavirus 2 by RT PCR (hospital order, performed in [facility] hospital lab) Nasopharyngeal Nasopharyngeal Swab     Status: None   Collection Time: 11/23/19  8:10 PM   Specimen: Nasopharyngeal Swab  Result Value Ref Range Status   SARS Coronavirus 2 NEGATIVE NEGATIVE Final    Comment: (NOTE) SARS-CoV-2 target nucleic acids are NOT DETECTED.  The SARS-CoV-2 RNA is generally detectable in upper and lower respiratory specimens during the acute phase of infection. The lowest concentration of SARS-CoV-2 viral copies this assay can detect is 250 copies / mL. A negative result does not preclude SARS-CoV-2 infection and should not be used as the sole basis for treatment or other patient management decisions.  A negative result may occur with improper specimen collection / handling, submission of specimen other than nasopharyngeal swab, presence of viral mutation(s) within the areas targeted by this assay, and inadequate number of viral copies (<250 copies / mL). A negative result must be combined with clinical observations, patient history, and epidemiological information.  Fact Sheet for Patients:   StrictlyIdeas.no  Fact Sheet for Healthcare  Providers: BankingDealers.co.za  This test is not yet approved or  cleared by the [place] FDA and has been authorized for detection and/or diagnosis of SARS-CoV-2 by FDA under an Emergency Use Authorization (EUA).  This EUA will remain in effect (meaning this test can be used) for the duration of the COVID-19 declaration under Section 564(b)(1) of the Act, 21 U.S.C. section 360bbb-3(b)(1), unless the authorization is terminated or revoked sooner.  Performed at [facility], [address], [place], [place] [address]      Time coordinating discharge: Over 30 minutes  SIGNED:   Wyvonnia Dusky, MD  [facility] 11/28/2019, 1:27 PM Pager   If 7PM-7AM, please contact night-coverage [email or web address]

## 2019-11-28 NOTE — Progress Notes (Signed)
Vernon Patterson and O x4. VSS. Pt tolerating diet well. No complaints of nausea or vomiting. IV removed intact, prescriptions given. Pt voices understanding of discharge instructions with no further questions. Patient discharged via wheelchair with NT  Allergies as of 11/28/2019   No Known Allergies     Medication List    STOP taking these medications   aspirin 325 MG EC tablet     TAKE these medications   ferrous sulfate 325 (65 FE) MG tablet Take 1 tablet (325 mg total) by mouth daily with breakfast. Start taking on: November 29, 2019   metoprolol tartrate 25 MG tablet Commonly known as: LOPRESSOR Take 25 mg by mouth daily.   pantoprazole 40 MG tablet Commonly known as: PROTONIX Take 1 tablet (40 mg total) by mouth 2 (two) times daily.   traZODone 50 MG tablet Commonly known as: DESYREL Take 50 mg by mouth at bedtime.            Durable Medical Equipment  (From admission, onward)         Start     Ordered   11/28/19 0957  For home use only DME 3 n 1  Once        11/28/19 0956   11/28/19 0957  For home use only DME Walker rolling  Once       Question Answer Comment  Walker: With [place] Wheels   Patient needs Patterson walker to treat with the following condition Generalized weakness      11/28/19 0956          Vitals:   11/28/19 0856 11/28/19 1249  BP: (!) 145/94 111/62  Pulse: (!) 58 (!) 52  Resp:  18  Temp:  97.9 F (36.6 C)  SpO2:  99%    Vernon Patterson

## 2019-11-28 NOTE — Evaluation (Signed)
Occupational Therapy Evaluation Patient Details Name: Vernon Patterson Bour MRN: [id number] DOB: 1956-01-04 Today's Date: 11/28/2019    History of Present Illness Vernon Patterson is a 64 y/o male who was admitted for GI bleeding with chief complaints of acute onset of fatigue and tiredness since Fri. July 9th, with diminished appetite, possible dark sttols, N/V, and maroon color vomitus. 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 PMH includes CAD, depression, HTN, anxiety, heart murmur, and history of alcohol use and smoker.   Clinical Impression   Pt presents this morning, awake with his two daughters present. 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 Per pt, he was independent in all ADL/IADL, with a weekly cleaning service to assist. Per pt's daughters, he wears the same pair of clothes every day and does not bathe daily, per pt choice. He has been eating ice cream only for the past two weeks due to difficulties swallowing. His daughters are concerned about pt's safety, cognition (they report he has occasional hallucinations) and his ability to care for himself. During evaluation, pt is alert and oriented to all questions, completes bed mobility and donning socks both with MOD I, requiring increased time/effort. He requires SUPERVISION for STS transfer without AD and CGA when weight-shifting while standing to prevent LOB. Pt will benefit from skilled acute OT services to address activity tolerance, balance and safety in the context of ADL. Recommend HHOT to maximize pt independence and safety in his home environment. Recommend daily, intermittent supervision.        Follow Up Recommendations  Home health OT;Supervision - Intermittent    Equipment Recommendations  Tub/shower seat    Recommendations for Other Services       Precautions / Restrictions  Precautions Precautions: Fall Restrictions Weight Bearing Restrictions: No      Mobility Bed Mobility Overal bed mobility: Modified Independent             General bed mobility comments: required increased time to perform  Transfers Overall transfer level: Needs assistance Equipment used: None Transfers: Sit to/from Stand Sit to Stand: Supervision         General transfer comment: SBA for sit <> stand transfer for safety    Balance Overall balance assessment: Needs assistance Sitting-balance support: Feet supported Sitting balance-Leahy Scale: Good Sitting balance - Comments: no noted LOB while seated EOB, reaching outside BOS   Standing balance support: No upper extremity supported Standing balance-Leahy Scale: Fair Standing balance comment: Pt slightly unsteady when weight shifting requiring CGA for balance.                           ADL either performed or assessed with clinical judgement   ADL Overall ADL's : Needs assistance/impaired     Grooming: Wash/dry hands;Standing;Min guard                               Functional mobility during ADLs: Min guard (took 3-4 steps to the sink) General ADL Comments: Donned both socks while seated EOB with MOD I (increased effort and time). Simulates UB dressing INDEPENDENTLY.     Vision Baseline Vision/History: Wears glasses Wears Glasses: Reading only Patient Visual Report: No change from baseline       Perception     Praxis  Pertinent Vitals/Pain Pain Assessment: No/denies pain Faces Pain Scale: Hurts little more Pain Location: belly Pain Descriptors / Indicators:  (bloating) Pain Intervention(s): Monitored during session;Limited activity within patient's tolerance     Hand Dominance     Extremity/Trunk Assessment Upper Extremity Assessment Upper Extremity Assessment: Overall WFL for tasks assessed;Generalized weakness (4-/5 bilaterally)   Lower Extremity  Assessment Lower Extremity Assessment: Generalized weakness       Communication Communication Communication: No difficulties   Cognition Arousal/Alertness: Awake/alert Behavior During Therapy: WFL for tasks assessed/performed Overall Cognitive Status: Within Functional Limits for tasks assessed                                 General Comments: Alert and oriented to self, place and situation. Uses humor frequently. Says he is at [facility]." Daughters report that pt has some occasional general confusion and hallucinations, as recently as two days ago.   General Comments       Exercises Exercises: Other exercises Other Exercises Other Exercises: Pt and caregivers educated re: role of OT in acute care, DME use for energy conservation, discharge and care planning for pt safety and caregiver comfort and home/routine modifications. Caregivers appreciative and receptive. Other Exercises: sit <> stand x 5 in 28.30 sec   Shoulder Instructions      Home Living Family/patient expects to be discharged to:: Private residence Living Arrangements: Alone Available Help at Discharge: Family;Available 24 hours/day;Neighbor;Available PRN/intermittently (daughters live out of town, will stay to assist as pt needs) Type of Home: House Home Access: Stairs to enter [employer] of Steps: 2 Entrance Stairs-Rails: Right Home Layout: One level     Bathroom Shower/Tub: Teacher, early years/pre: Standard Bathroom Accessibility: No   Home Equipment: Walker - standard   Additional Comments: Has removable grab bars (per daughters, they are old). Daughters report that they will be available to assist pt at d/c and that he has neighbors that can come PRN.      Prior Functioning/Environment Level of Independence: Independent        Comments: Pt reports independence with ambulation and transfers. Pt reports having someone come to clean his clothes and  daughters report that pt will wear clothes for a month at a time and chooses to not bathe frequently. He reports driving and working prior to admission. Pt denies fall history.        OT Problem List: Decreased strength;Decreased activity tolerance;Impaired balance (sitting and/or standing)      OT Treatment/Interventions: Self-care/ADL training;Therapeutic exercise;Therapeutic activities;Energy conservation;DME and/or AE instruction;Patient/family education;Balance training    OT Goals(Current goals can be found in the care plan section) Acute Rehab OT Goals Patient Stated Goal: to go home safely OT Goal Formulation: With patient/family Time For Goal Achievement: 12/12/19 Potential to Achieve Goals: Good ADL Goals Pt Will Perform Grooming: with supervision;standing (x15 min to maximize endurance and activity tolerance) Pt Will Transfer to Toilet: with supervision;ambulating;regular height toilet (using LRAD)  OT Frequency: Min 1X/week   Barriers to D/C:            Co-evaluation              AM-PAC OT "6 Clicks" Daily Activity     Outcome Measure Help from another person eating meals?: None Help from another person taking care of personal grooming?: None Help from another person toileting, which includes using toliet, bedpan, or urinal?: None Help from another person bathing (  including washing, rinsing, drying)?: A Little Help from another person to put on and taking off regular upper body clothing?: None Help from another person to put on and taking off regular lower body clothing?: A Little 6 Click Score: 22   End of Session Equipment Utilized During Treatment: Gait belt Nurse Communication: Other (comment) (Daughters' concern of pt's hallucinations)  Activity Tolerance: Patient tolerated treatment well Patient left: in bed;with Krasinski bell/phone within reach;with bed alarm set;with family/visitor present;with nursing/sitter in room  OT Visit Diagnosis: Unsteadiness on  feet (R26.81);Muscle weakness (generalized) (M62.81);Other symptoms and signs involving cognitive function;Adult, failure to thrive (R62.7)                Time: 9449-6759 OT Time Calculation (min): 38 min Charges:  OT General Charges $OT Visit: 1 Visit OT Evaluation $OT Eval Moderate Complexity: 1 Mod OT Treatments $Self Care/Home Management : 23-37 mins   Jerilynn Birkenhead, OTS 11/28/19, 1:54 PM

## 2019-11-28 NOTE — Telephone Encounter (Signed)
PET order entered.

## 2019-11-28 NOTE — Telephone Encounter (Signed)
Left voicemail message to [place] back. Called scheduling for cath lab to alert them of change and Dr. Rockey Situ called to alert specials as well that procedure has been canceled.

## 2019-11-29 ENCOUNTER — Inpatient Hospital Stay: Payer: Medicare Other | Admitting: Oncology

## 2019-11-29 ENCOUNTER — Telehealth: Payer: Self-pay | Admitting: *Deleted

## 2019-11-29 ENCOUNTER — Inpatient Hospital Stay: Payer: Medicare Other

## 2019-11-29 ENCOUNTER — Telehealth: Payer: Self-pay

## 2019-11-29 DIAGNOSIS — F5000 Anorexia nervosa, unspecified: Secondary | ICD-10-CM

## 2019-11-29 DIAGNOSIS — R1010 Upper abdominal pain, unspecified: Secondary | ICD-10-CM

## 2019-11-29 DIAGNOSIS — R5383 Other fatigue: Secondary | ICD-10-CM

## 2019-11-29 DIAGNOSIS — R531 Weakness: Secondary | ICD-10-CM

## 2019-11-29 DIAGNOSIS — D490 Neoplasm of unspecified behavior of digestive system: Secondary | ICD-10-CM

## 2019-11-29 LAB — CEA: CEA: 1.6 ng/mL (ref 0.0–4.7)

## 2019-11-29 NOTE — Telephone Encounter (Signed)
Spoke to patient's daughter.  She is concerned that he is sleeping a lot since coming home from the hospital.  Does admit that he was requiring more sleep than usual before the admission but it seems to be even more since d/c.  I explained that maybe he is need the additioanal rest due to the restless sleep that may happen during a hospilization.  No other major concerns at this time and advised her to Shelley the office if needed.

## 2019-11-29 NOTE — Telephone Encounter (Signed)
Per Dr Tasia Catchings patient to come in for labs (cbc, CMP, MG, Phos), doctor, IV fluids today. Appointment accepted by Miquel Dunn and will be in clinic in about 30 minutes

## 2019-11-29 NOTE — Telephone Encounter (Signed)
Attempted to schedule no ans no vm  

## 2019-11-29 NOTE — Telephone Encounter (Signed)
Patient daughter Vernon Patterson called reporting that patient was discharged from hospital yesterday and that he has slept since he got home. She also reports that he fell when getting into car with assistance form nurses. She states that he is not as talkative and feels he is declining. He is complaining of pain in his stomach radiating into his back and he has nothing for pain except Tylenol. He got choked on one of his pills which caused him to vomit. He is not eating or drinking much.  She does not know if he has any fever or not, but will check his temp and let us know when we Wolke back.She is concerned about him and knows that he has an appointment Thursday for hospital follow up with Dr Tasia Catchings. She is asking what can be done for him. Please advise

## 2019-11-29 NOTE — Telephone Encounter (Signed)
Daughter called back  and states that patient refused to come in to office today. She said he told her he is sleeping and feels a little better since sleeping and taking Tylenol. She is asking what she can do next. Please return her Coles

## 2019-11-29 NOTE — Telephone Encounter (Signed)
-----   Message from Arvil Chaco, PA-C sent at 11/28/2019  8:03 AM EDT ----- Regarding: New patient visit Hi there,  This patient was seen in the hospital and initially needed catheterization; however, he underwent EGD and cancer was found with recommendation to cancel outpatient cath.  I have cancelled the orders.  Can we ensure that outpatient new patient appointment is scheduled within the next month?  Thank you, JV

## 2019-11-30 ENCOUNTER — Telehealth: Payer: Self-pay | Admitting: Cardiovascular Disease

## 2019-11-30 ENCOUNTER — Other Ambulatory Visit: Payer: Self-pay

## 2019-11-30 ENCOUNTER — Other Ambulatory Visit: Payer: Self-pay | Admitting: Oncology

## 2019-11-30 DIAGNOSIS — D490 Neoplasm of unspecified behavior of digestive system: Secondary | ICD-10-CM

## 2019-11-30 NOTE — Telephone Encounter (Signed)
Left voicemail message for patient that we need to schedule hospital follow up appointment here in our office to discuss plan of care and if we need to reschedule his procedure with our number.

## 2019-11-30 NOTE — Telephone Encounter (Signed)
Marcie Bal called to confirm if heart cath was still needed for this patient. Reviewed notes documenting that it was canceled pending his follow up with our office after plan for his cancer treatment has been determined. She verbalized understanding and they are removing from their board. No further questions at this time.

## 2019-12-01 ENCOUNTER — Ambulatory Visit: Admission: RE | Admit: 2019-12-01 | Payer: Medicare Other | Source: Home / Self Care | Admitting: Cardiovascular Disease

## 2019-12-01 ENCOUNTER — Encounter: Payer: Self-pay | Admitting: Oncology

## 2019-12-01 ENCOUNTER — Encounter: Admission: RE | Payer: Self-pay | Source: Home / Self Care

## 2019-12-01 ENCOUNTER — Inpatient Hospital Stay: Payer: Medicare Other

## 2019-12-01 ENCOUNTER — Other Ambulatory Visit: Payer: Self-pay

## 2019-12-01 ENCOUNTER — Inpatient Hospital Stay: Payer: Medicare Other | Attending: Oncology | Admitting: Oncology

## 2019-12-01 VITALS — BP 111/78 | HR 81 | Temp 95.6°F | Resp 18 | Wt 153.9 lb

## 2019-12-01 DIAGNOSIS — F329 Major depressive disorder, single episode, unspecified: Secondary | ICD-10-CM | POA: Diagnosis not present

## 2019-12-01 DIAGNOSIS — Z79899 Other long term (current) drug therapy: Secondary | ICD-10-CM | POA: Insufficient documentation

## 2019-12-01 DIAGNOSIS — I10 Essential (primary) hypertension: Secondary | ICD-10-CM | POA: Insufficient documentation

## 2019-12-01 DIAGNOSIS — Z7189 Other specified counseling: Secondary | ICD-10-CM | POA: Insufficient documentation

## 2019-12-01 DIAGNOSIS — R634 Abnormal weight loss: Secondary | ICD-10-CM

## 2019-12-01 DIAGNOSIS — R188 Other ascites: Secondary | ICD-10-CM | POA: Diagnosis not present

## 2019-12-01 DIAGNOSIS — R112 Nausea with vomiting, unspecified: Secondary | ICD-10-CM | POA: Diagnosis not present

## 2019-12-01 DIAGNOSIS — C160 Malignant neoplasm of cardia: Secondary | ICD-10-CM | POA: Diagnosis not present

## 2019-12-01 DIAGNOSIS — R011 Cardiac murmur, unspecified: Secondary | ICD-10-CM | POA: Insufficient documentation

## 2019-12-01 DIAGNOSIS — I2510 Atherosclerotic heart disease of native coronary artery without angina pectoris: Secondary | ICD-10-CM | POA: Diagnosis not present

## 2019-12-01 DIAGNOSIS — Z87891 Personal history of nicotine dependence: Secondary | ICD-10-CM | POA: Insufficient documentation

## 2019-12-01 DIAGNOSIS — I340 Nonrheumatic mitral (valve) insufficiency: Secondary | ICD-10-CM | POA: Insufficient documentation

## 2019-12-01 DIAGNOSIS — Z951 Presence of aortocoronary bypass graft: Secondary | ICD-10-CM | POA: Insufficient documentation

## 2019-12-01 DIAGNOSIS — D490 Neoplasm of unspecified behavior of digestive system: Secondary | ICD-10-CM

## 2019-12-01 DIAGNOSIS — I352 Nonrheumatic aortic (valve) stenosis with insufficiency: Secondary | ICD-10-CM | POA: Diagnosis not present

## 2019-12-01 HISTORY — DX: Nausea with vomiting, unspecified: R11.2

## 2019-12-01 LAB — CBC WITH DIFFERENTIAL/PLATELET
Abs Immature Granulocytes: 0.02 10*3/uL (ref 0.00–0.07)
Basophils Absolute: 0 10*3/uL (ref 0.0–0.1)
Basophils Relative: 0 %
Eosinophils Absolute: 0.1 10*3/uL (ref 0.0–0.5)
Eosinophils Relative: 1 %
HCT: 31.3 % — ABNORMAL LOW (ref 39.0–52.0)
Hemoglobin: 10.7 g/dL — ABNORMAL LOW (ref 13.0–17.0)
Immature Granulocytes: 0 %
Lymphocytes Relative: 19 %
Lymphs Abs: 1.6 10*3/uL (ref 0.7–4.0)
MCH: 32 pg (ref 26.0–34.0)
MCHC: 34.2 g/dL (ref 30.0–36.0)
MCV: 93.7 fL (ref 80.0–100.0)
Monocytes Absolute: 0.9 10*3/uL (ref 0.1–1.0)
Monocytes Relative: 11 %
Neutro Abs: 5.9 10*3/uL (ref 1.7–7.7)
Neutrophils Relative %: 69 %
Platelets: 246 10*3/uL (ref 150–400)
RBC: 3.34 MIL/uL — ABNORMAL LOW (ref 4.22–5.81)
RDW: 16.1 % — ABNORMAL HIGH (ref 11.5–15.5)
WBC: 8.5 10*3/uL (ref 4.0–10.5)
nRBC: 0 % (ref 0.0–0.2)

## 2019-12-01 LAB — COMPREHENSIVE METABOLIC PANEL
ALT: 14 U/L (ref 0–44)
AST: 22 U/L (ref 15–41)
Albumin: 2.5 g/dL — ABNORMAL LOW (ref 3.5–5.0)
Alkaline Phosphatase: 65 U/L (ref 38–126)
Anion gap: 6 (ref 5–15)
BUN: 7 mg/dL — ABNORMAL LOW (ref 8–23)
CO2: 21 mmol/L — ABNORMAL LOW (ref 22–32)
Calcium: 8.2 mg/dL — ABNORMAL LOW (ref 8.9–10.3)
Chloride: 108 mmol/L (ref 98–111)
Creatinine, Ser: 0.61 mg/dL (ref 0.61–1.24)
GFR calc Af Amer: >60 mL/min (ref >60)
GFR calc non Af Amer: >60 mL/min (ref >60)
Glucose, Bld: 114 mg/dL — ABNORMAL HIGH (ref 70–99)
Potassium: 4.2 mmol/L (ref 3.5–5.1)
Sodium: 135 mmol/L (ref 135–145)
Total Bilirubin: 1 mg/dL (ref 0.3–1.2)
Total Protein: 5.9 g/dL — ABNORMAL LOW (ref 6.5–8.1)

## 2019-12-01 LAB — SURGICAL PATHOLOGY

## 2019-12-01 LAB — MAGNESIUM: Magnesium: 1.6 mg/dL — ABNORMAL LOW (ref 1.7–2.4)

## 2019-12-01 SURGERY — RIGHT/LEFT HEART CATH AND CORONARY/GRAFT ANGIOGRAPHY
Anesthesia: Moderate Sedation

## 2019-12-01 MED ORDER — DEXAMETHASONE SODIUM PHOSPHATE 10 MG/ML IJ SOLN
10.0000 mg | Freq: Once | INTRAMUSCULAR | Status: AC
  Administered 2019-12-01: 10 mg via INTRAVENOUS
  Filled 2019-12-01: qty 1

## 2019-12-01 MED ORDER — ONDANSETRON 8 MG PO TBDP: 8.0000 mg | ORAL_TABLET | Freq: Four times a day (QID) | ORAL | 1 refills | Status: AC | PRN

## 2019-12-01 MED ORDER — MAGNESIUM CHLORIDE 64 MG PO TBEC: 1.0000 | DELAYED_RELEASE_TABLET | Freq: Every day | ORAL | 0 refills | Status: AC

## 2019-12-01 MED ORDER — PROCHLORPERAZINE MALEATE 10 MG PO TABS
10.0000 mg | ORAL_TABLET | Freq: Four times a day (QID) | ORAL | 0 refills | Status: AC | PRN
Start: 2019-12-01 — End: ?

## 2019-12-01 MED ORDER — SODIUM CHLORIDE 0.9 % IV SOLN
INTRAVENOUS | Status: DC
  Filled 2019-12-01 (×2): qty 1000

## 2019-12-01 MED ORDER — ONDANSETRON HCL 4 MG/2ML IJ SOLN
8.0000 mg | Freq: Once | INTRAMUSCULAR | Status: AC
  Administered 2019-12-01: 8 mg via INTRAVENOUS
  Filled 2019-12-01: qty 4

## 2019-12-01 NOTE — Telephone Encounter (Signed)
I did alert scheduling both at central scheduling and also front office. We will await his results and then attempt again to get him scheduled once all of that has been done.

## 2019-12-01 NOTE — Telephone Encounter (Signed)
Prior to any work-up of his aortic valve stenosis, needs management of his GI cancer

## 2019-12-01 NOTE — Progress Notes (Addendum)
Hematology/Oncology Follow Up Note [facility]  Telephone:([phone] [phone]  Patient Care Team: Albina Billet, MD as PCP - General (Internal Medicine) Dionisio David, MD (Cardiology) Clent Jacks, RN as Oncology Nurse Navigator Earlie Server, MD as Consulting Physician (Oncology)   Name of the patient: Vernon Patterson  [id number]  1955-12-07   REASON FOR VISIT  follow-up for squamous cell carcinoma  INTERVAL HISTORY 64 y.o. 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   Patient was recently admitted due to profound weakness, unintentional weight loss, dark stool, nausea and vomiting. EGD showed gastric cardia mass, malignant appearing GE junction stenosis, left piriform sinus ulceration.  Gastric cardia mass was biopsied.  CBC showed hemoglobin of 7.4.  He received PRBC transfusion. Physical examination reviewed significant systolic murmur.  Patient was seen by cardiologist. Patient was given supportive care including IV fluid hydration, antiemetics, PPI. Also was evaluated by ENT.  Today patient presents to discuss pathology report, follow-up. Patient's daughter called 2 days ago reporting that patient is feeling very weak.  Patient was advised to go back to emergency room and he refused.  Patient reports feeling nauseated and vomited during today's visit.  Per daughter, patient also fell and scraped his left lower extremity. His appetite is very poor.  No fever or chills. Very poor oral intake.  Denies any pain.   Review of Systems  Constitutional: Positive for appetite change, fatigue and unexpected weight change. Negative for chills and fever.  HENT:   Negative for hearing loss and voice change.   Eyes: Negative for eye problems and icterus.  Respiratory: Negative for chest tightness, cough and  shortness of breath.   Cardiovascular: Negative for chest pain and leg swelling.  Gastrointestinal: Positive for nausea and vomiting. Negative for abdominal distention and abdominal pain.  Endocrine: Negative for hot flashes.  Genitourinary: Negative for difficulty urinating, dysuria and frequency.   Musculoskeletal: Negative for arthralgias.  Skin: Negative for itching and rash.  Neurological: Negative for light-headedness and numbness.  Hematological: Negative for adenopathy. Does not bruise/bleed easily.  Psychiatric/Behavioral: Negative for confusion.      No Known Allergies   Past Medical History:  Diagnosis Date  . Anxiety   . Coronary artery disease   . Depression   . Heart murmur   . Hypertension   . Nausea and vomiting 12/01/2019     Past Surgical History:  Procedure Laterality Date  . CARDIAC CATHETERIZATION    . CORONARY ARTERY BYPASS GRAFT    . CORONARY ARTERY BYPASS GRAFT N/A 04/18/2013   Procedure: CORONARY ARTERY BYPASS GRAFTING (CABG);  Surgeon: Gaye Pollack, MD;  Location: [place];  Service: Open Heart Surgery;  Laterality: N/A;  Times  3  using left internal mammary artery and endoscopically harvested right saphenous vein  . ENDOVEIN HARVEST OF GREATER SAPHENOUS VEIN Right 04/18/2013   Procedure: ENDOVEIN HARVEST OF GREATER SAPHENOUS VEIN;  Surgeon: Gaye Pollack, MD;  Location: [place];  Service: Open Heart Surgery;  Laterality: Right;  Some of the saphenous vein from the lower leg also used.  . ESOPHAGOGASTRODUODENOSCOPY (EGD) WITH PROPOFOL N/A 11/26/2019   Procedure: ESOPHAGOGASTRODUODENOSCOPY (EGD) WITH PROPOFOL;  Surgeon: Lin Landsman, MD;  Location: [place];  Service: Gastroenterology;  Laterality: N/A;  . INTRAOPERATIVE TRANSESOPHAGEAL ECHOCARDIOGRAM N/A 04/18/2013   Procedure: INTRAOPERATIVE TRANSESOPHAGEAL ECHOCARDIOGRAM;  Surgeon: Gaye Pollack, MD;  Location:  [place];  Service: Open Heart Surgery;  Laterality: N/A;    Social History    Socioeconomic History  . Marital status: Married    Spouse name: Not on file  . Number of children: Not on file  . Years of education: Not on file  . Highest education level: Not on file  Occupational History  . Occupation: not employed     Fish farm manager: [employer]  Tobacco Use  . Smoking status: Former Smoker    Types: Cigars    Quit date: 11/24/2019    Years since quitting: 0.0  . Smokeless tobacco: Never Used  . Tobacco comment: 1-2 cigars/day  Vaping Use  . Vaping Use: Never used  Substance and Sexual Activity  . Alcohol use: Yes    Comment: 5 bottles of wine/week/ Quit drinking about 5 months   . Drug use: No  . Sexual activity: Never  Other Topics Concern  . Not on file  Social History Narrative  . Not on file   Social Determinants of Health   Financial Resource Strain:   . Difficulty of Paying Living Expenses:   Food Insecurity:   . Worried About Charity fundraiser in the Last Year:   . Arboriculturist in the Last Year:   Transportation Needs:   . Film/video editor (Medical):   Marland Kitchen Lack of Transportation (Non-Medical):   Physical Activity:   . Days of Exercise per Week:   . Minutes of Exercise per Session:   Stress:   . Feeling of Stress :   Social Connections:   . Frequency of Communication with Friends and Family:   . Frequency of Social Gatherings with Friends and Family:   . Attends Religious Services:   . Active Member of Clubs or Organizations:   . Attends Archivist Meetings:   Marland Kitchen Marital Status:   Intimate Partner Violence:   . Fear of Current or Ex-Partner:   . Emotionally Abused:   Marland Kitchen Physically Abused:   . Sexually Abused:     Family History  Problem Relation Age of Onset  . Diabetes Mother      Current Outpatient Medications:  .  acetaminophen (TYLENOL) 325 MG tablet, Take 650 mg by mouth every 6 (six) hours as needed., Disp: , Rfl:  .  ferrous sulfate 325 (65 FE) MG tablet, Take 1 tablet (325 mg total) by mouth daily  with breakfast., Disp: 30 tablet, Rfl: 0 .  metoprolol tartrate (LOPRESSOR) 25 MG tablet, Take 25 mg by mouth daily., Disp: , Rfl:  .  pantoprazole (PROTONIX) 40 MG tablet, Take 1 tablet (40 mg total) by mouth 2 (two) times daily., Disp: 60 tablet, Rfl: 0 .  traZODone (DESYREL) 50 MG tablet, Take 50 mg by mouth at bedtime., Disp: , Rfl:  .  magnesium chloride (SLOW-MAG) 64 MG TBEC SR tablet, Take 1 tablet (64 mg total) by mouth daily., Disp: 30 tablet, Rfl: 0 .  ondansetron (ZOFRAN ODT) 8 MG disintegrating tablet, Take 1 tablet (8 mg total) by mouth every 6 (six) hours as needed for nausea or vomiting., Disp: 60 tablet, Rfl: 1 .  prochlorperazine (COMPAZINE) 10 MG tablet, Take 1 tablet (10 mg total) by mouth every 6 (six) hours as needed for nausea or vomiting., Disp: 60 tablet, Rfl: 0  Current Facility-Administered Medications:  .  sodium chloride 0.9 % 1,000 mL with magnesium sulfate 2 g infusion, , Intravenous, Continuous, Earlie Server, MD, Paused at 12/01/19 1649  Facility-Administered Medications Ordered  in Other Visits:  .  sodium chloride flush (NS) 0.9 % injection 3 mL, 3 mL, Intravenous, Q12H, Arvil Chaco, PA-C  Physical exam:  Vitals:   12/01/19 1422  BP: 111/78  Pulse: 81  Resp: 18  Temp: (!) 95.6 F (35.3 C)  Weight: 153 lb 14.4 oz (69.8 kg)   Physical Exam Constitutional:      General: He is not in acute distress.    Appearance: He is ill-appearing.     Comments: Cachectic patient sits in the wheelchair  HENT:     Head: Normocephalic and atraumatic.  Eyes:     General: No scleral icterus. Cardiovascular:     Rate and Rhythm: Normal rate and regular rhythm.     Heart sounds: Murmur heard.   Pulmonary:     Effort: Pulmonary effort is normal. No respiratory distress.     Breath sounds: Normal breath sounds. No wheezing.  Abdominal:     General: Bowel sounds are normal. There is no distension.     Palpations: Abdomen is soft.  Musculoskeletal:        General:  Swelling present. No deformity. Normal range of motion.     Cervical back: Normal range of motion and neck supple.     Comments: Bilateral pedal edema 2+  Skin:    General: Skin is warm and dry.     Findings: No erythema or rash.  Neurological:     Mental Status: He is alert and oriented to person, place, and time. Mental status is at baseline.     Cranial Nerves: No cranial nerve deficit.     Coordination: Coordination normal.  Psychiatric:        Mood and Affect: Mood normal.     CMP Latest Ref Rng & Units 12/01/2019  Glucose 70 - 99 mg/dL 114(H)  BUN 8 - 23 mg/dL 7(L)  Creatinine 0.61 - 1.24 mg/dL 0.61  Sodium 135 - 145 mmol/L 135  Potassium 3.5 - 5.1 mmol/L 4.2  Chloride 98 - 111 mmol/L 108  CO2 22 - 32 mmol/L 21(L)  Calcium 8.9 - 10.3 mg/dL 8.2(L)  Total Protein 6.5 - 8.1 g/dL 5.9(L)  Total Bilirubin 0.3 - 1.2 mg/dL 1.0  Alkaline Phos 38 - 126 U/L 65  AST 15 - 41 U/L 22  ALT 0 - 44 U/L 14   CBC Latest Ref Rng & Units 12/01/2019  WBC 4.0 - 10.5 K/uL 8.5  Hemoglobin 13.0 - 17.0 g/dL 10.7(L)  Hematocrit 39 - 52 % 31.3(L)  Platelets 150 - 400 K/uL 246    RADIOGRAPHIC STUDIES: I have personally reviewed the radiological images as listed and agreed with the findings in the report. DG Chest 2 View  Result Date: 11/26/2019 CLINICAL DATA:  Cough and weakness EXAM: CHEST - 2 VIEW COMPARISON:  Chest radiograph 10/19/2019 FINDINGS: Stable cardiomediastinal contours status post median sternotomy. Minimal atelectasis at the right lung base. No focal consolidation. On lateral view there is a 9 mm nodule inferiorly which is not well seen on the frontal projection. No pneumothorax or pleural effusion. No acute finding in the visualized skeleton. IMPRESSION: 1. No evidence of active disease in the chest. 2. 9 mm nodule inferiorly on the lateral view which is not well seen on the frontal projection. Consider nonemergent chest CT for further evaluation. Electronically Signed   By: Audie Pinto M.D.   On: 11/26/2019 17:12   ECHOCARDIOGRAM COMPLETE  Result Date: 11/25/2019    ECHOCARDIOGRAM REPORT   Patient Name:  Bryston C Castoro Date of Exam: 11/25/2019 Medical Rec #:  [id number]      Height:       68.0 in Accession #:    [id number]     Weight:       129.9 lb Date of Birth:  1956-02-10      BSA:          1.701 m Patient Age:    59 years       BP:           143/81 mmHg Patient Gender: M              HR:           58 bpm. Exam Location:  [facility] Procedure: 2D Echo, Color Doppler and Cardiac Doppler Indications:     R01.2 Abnormal Heart Sounds Nec  History:         Patient has no prior history of Echocardiogram examinations.                  CAD; Prior CABG.  Sonographer:     Charmayne Sheer RDCS (AE) Referring Phys:  [id number] Arvil Chaco Diagnosing Phys: Ida Rogue MD IMPRESSIONS  1. Left ventricular ejection fraction, by estimation, is 60 to 65%. The left ventricle has normal function. The left ventricle has no regional wall motion abnormalities. Left ventricular diastolic parameters are consistent with Grade II diastolic dysfunction (pseudonormalization).  2. Right ventricular systolic function is normal. The right ventricular size is normal. There is mildly elevated pulmonary artery systolic pressure.  3. Left atrial size was mildly dilated.  4. The aortic valve is heavily calcified. Aortic valve regurgitation is mild. Severe aortic valve stenosis. Aortic valve area, by VTI measures 0.86 cm. Aortic valve mean gradient measures 38.0 mmHg. Aortic valve Vmax measures 4.02 m/s. FINDINGS  Left Ventricle: Left ventricular ejection fraction, by estimation, is 60 to 65%. The left ventricle has normal function. The left ventricle has no regional wall motion abnormalities. The left ventricular internal cavity size was normal in size. There is  no left ventricular hypertrophy. Left ventricular diastolic parameters are consistent with Grade II diastolic dysfunction (pseudonormalization). Right  Ventricle: The right ventricular size is normal. No increase in right ventricular wall thickness. Right ventricular systolic function is normal. There is mildly elevated pulmonary artery systolic pressure. The tricuspid regurgitant velocity is 2.53  m/s, and with an assumed right atrial pressure of 10 mmHg, the estimated right ventricular systolic pressure is 64.6 mmHg. Left Atrium: Left atrial size was mildly dilated. Right Atrium: Right atrial size was normal in size. Pericardium: There is no evidence of pericardial effusion. Mitral Valve: The mitral valve is normal in structure. There is mild thickening of the mitral valve leaflet(s). Normal mobility of the mitral valve leaflets. Mild mitral valve regurgitation. No evidence of mitral valve stenosis. MV peak gradient, 4.2 mmHg. The mean mitral valve gradient is 2.0 mmHg. Tricuspid Valve: The tricuspid valve is normal in structure. Tricuspid valve regurgitation is not demonstrated. No evidence of tricuspid stenosis. Aortic Valve: The aortic valve is normal in structure. Aortic valve regurgitation is mild. Severe aortic stenosis is present. Aortic valve mean gradient measures 38.0 mmHg. Aortic valve peak gradient measures 64.6 mmHg. Aortic valve area, by VTI measures  0.86 cm. Pulmonic Valve: The pulmonic valve was normal in structure. Pulmonic valve regurgitation is not visualized. No evidence of pulmonic stenosis. Aorta: The aortic root is normal in size and structure. Venous: The inferior vena cava is normal in  size with greater than 50% respiratory variability, suggesting right atrial pressure of 3 mmHg. IAS/Shunts: No atrial level shunt detected by color flow Doppler.  LEFT VENTRICLE PLAX 2D LVIDd:         3.94 cm  Diastology LVIDs:         2.13 cm  LV e' lateral:   9.90 cm/s LV PW:         1.26 cm  LV E/e' lateral: 10.4 LV IVS:        1.13 cm  LV e' medial:    5.22 cm/s LVOT diam:     2.40 cm  LV E/e' medial:  19.7 LV SV:         89 LV SV Index:   52 LVOT  Area:     4.52 cm  RIGHT VENTRICLE RV Basal diam:  3.15 cm LEFT ATRIUM             Index       RIGHT ATRIUM           Index LA diam:        4.20 cm 2.47 cm/m  RA Area:     12.70 cm LA Vol (A2C):   62.5 ml 36.74 ml/m RA Volume:   27.80 ml  16.34 ml/m LA Vol (A4C):   48.2 ml 28.33 ml/m LA Biplane Vol: 55.2 ml 32.45 ml/m  AORTIC VALVE                    PULMONIC VALVE AV Area (Vmax):    0.82 cm     PV Vmax:       2.01 m/s AV Area (Vmean):   0.95 cm     PV Vmean:      135.000 cm/s AV Area (VTI):     0.86 cm     PV VTI:        0.470 m AV Vmax:           402.00 cm/s  PV Peak grad:  16.2 mmHg AV Vmean:          247.500 cm/s PV Mean grad:  9.0 mmHg AV VTI:            1.040 m AV Peak Grad:      64.6 mmHg AV Mean Grad:      38.0 mmHg LVOT Vmax:         72.50 cm/s LVOT Vmean:        51.900 cm/s LVOT VTI:          0.197 m LVOT/AV VTI ratio: 0.19  AORTA Ao Root diam: 3.80 cm MITRAL VALVE                TRICUSPID VALVE MV Area (PHT): 4.04 cm     TR Peak grad:   25.6 mmHg MV Peak grad:  4.2 mmHg     TR Vmax:        253.00 cm/s MV Mean grad:  2.0 mmHg MV Vmax:       1.02 m/s     SHUNTS MV Vmean:      58.1 cm/s    Systemic VTI:  0.20 m MV Decel Time: 188 msec     Systemic Diam: 2.40 cm MV E velocity: 103.00 cm/s MV A velocity: 45.00 cm/s MV E/A ratio:  2.29 Ida Rogue MD Electronically signed by Ida Rogue MD Signature Date/Time: 11/25/2019/2:19:12 PM    Final      Assessment and plan 1. Nausea and vomiting,  intractability of vomiting not specified, unspecified vomiting type   2. Weight loss   3. 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 Hypomagnesemia   5. Goals of care, counseling/discussion   6. Piriform sinus tumor    #Gastric cardia mass, malignant appearing GE junction stenosis Biopsy showed squamous cell carcinoma. I have discussed with pathologist and ask to add a p16 staining. Patient missed his appointment with ENT for left piriform sinus ulceration biopsy-?  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 PET scan was scheduled on 12/07/2019-earliest appointment Additional management plan depends on PET scan. Patient has poor performance status and multiple comorbidities.  Not medically fit for surgery. If 2 primaries, no distant metastasis, consider concurrent chemotherapy radiation #If distant metastasis, patient may still need palliative radiation due to stenosis.  Refer to radiation oncology will consider systemic chemotherapy. Check NGS Goal of care, prognosis poor given his poor performance status.  I will refer patient to establish care with palliative care service.  #Nausea vomiting and poor oral intake, Proceed with 1 L of IV fluid to be given over 1 hour, IV Zofran 8 mg x 1, IV dexamethasone 10 mg x 1, I sent a prescription of Zofran ODT, #Weight loss, refer to nutritionist. #Hypomagnesia, proceed with IV magnesium 2 g x 1.  I sent a prescription of Slow-Mag once daily.   Orders Placed This Encounter  Procedures  . CBC with Differential/Platelet    Standing Status:   Future    Standing Expiration Date:   11/30/2020  . Comprehensive metabolic panel    Standing Status:   Future    Standing Expiration Date:   11/30/2020  . Magnesium    Standing Status:   Future    Standing Expiration Date:   11/30/2020  . Amb Referral to Nutrition and Diabetic Education    Referral Priority:   Routine    Referral Type:   Consultation    Referral Reason:   Specialty Services Required    Number of Visits Requested:   1  . Ambulatory Referral to Palliative Care    Referral Priority:   Routine    Referral Type:   Consultation    Referred to Provider:   Borders, Kirt Boys, NP    Number of Visits Requested:   1  . Ambulatory referral to Radiation Oncology    Referral Priority:   Routine    Referral Type:   Consultation    Referral Reason:   Specialty Services Required    Requested Specialty:   Radiation Oncology     Number of Visits Requested:   1    Follow-up in 3 to 5 days for reevaluation.   Earlie Server, MD, PhD Hematology Oncology [facility]- [phone] 12/01/2019  # Addendum PET scan is reviewed. At least locally advance N3 disease.  Given his poor performance status, I recommend palliative radiation.  Awaiting ENT biopsy of oropharyngeal ulceration.  If his performance status improves, will consider systemic chemotherapy.   Earlie Server

## 2019-12-01 NOTE — Progress Notes (Signed)
Patient here to establish care. Patient's daughter present during visit. Per daughter, Miquel Dunn, pt has been very weak and tired, energy is low and he has been sleeping a lot.

## 2019-12-01 NOTE — Progress Notes (Signed)
Briefly introduced nurse navigator services and provided contact information for future needs.

## 2019-12-02 ENCOUNTER — Telehealth: Payer: Self-pay

## 2019-12-02 NOTE — Telephone Encounter (Signed)
request for omniseq test faxed to [facility] path   specimen ID: [id number] collected on 11/26/19

## 2019-12-05 ENCOUNTER — Inpatient Hospital Stay (HOSPITAL_BASED_OUTPATIENT_CLINIC_OR_DEPARTMENT_OTHER): Payer: Medicare Other | Admitting: Hospice and Palliative Medicine

## 2019-12-05 ENCOUNTER — Ambulatory Visit
Admission: RE | Admit: 2019-12-05 | Discharge: 2019-12-05 | Disposition: A | Discharge status: Home / Self Care | Payer: Medicare Other | Source: Ambulatory Visit | Attending: Oncology | Admitting: Oncology

## 2019-12-05 ENCOUNTER — Encounter: Payer: Self-pay | Admitting: Oncology

## 2019-12-05 ENCOUNTER — Inpatient Hospital Stay: Payer: Medicare Other

## 2019-12-05 ENCOUNTER — Inpatient Hospital Stay (HOSPITAL_BASED_OUTPATIENT_CLINIC_OR_DEPARTMENT_OTHER): Payer: Medicare Other | Admitting: Oncology

## 2019-12-05 ENCOUNTER — Telehealth: Payer: Self-pay

## 2019-12-05 ENCOUNTER — Other Ambulatory Visit: Payer: Self-pay

## 2019-12-05 VITALS — BP 139/88 | HR 58 | Temp 96.4°F | Resp 18 | Wt 155.1 lb

## 2019-12-05 DIAGNOSIS — R1010 Upper abdominal pain, unspecified: Secondary | ICD-10-CM

## 2019-12-05 DIAGNOSIS — Z7189 Other specified counseling: Secondary | ICD-10-CM | POA: Diagnosis not present

## 2019-12-05 DIAGNOSIS — R112 Nausea with vomiting, unspecified: Secondary | ICD-10-CM

## 2019-12-05 DIAGNOSIS — D490 Neoplasm of unspecified behavior of digestive system: Secondary | ICD-10-CM

## 2019-12-05 DIAGNOSIS — R531 Weakness: Secondary | ICD-10-CM

## 2019-12-05 DIAGNOSIS — G893 Neoplasm related pain (acute) (chronic): Secondary | ICD-10-CM

## 2019-12-05 DIAGNOSIS — C160 Malignant neoplasm of cardia: Secondary | ICD-10-CM

## 2019-12-05 DIAGNOSIS — Z515 Encounter for palliative care: Secondary | ICD-10-CM | POA: Diagnosis not present

## 2019-12-05 DIAGNOSIS — R140 Abdominal distension (gaseous): Secondary | ICD-10-CM

## 2019-12-05 DIAGNOSIS — R5383 Other fatigue: Secondary | ICD-10-CM

## 2019-12-05 LAB — CBC WITH DIFFERENTIAL/PLATELET
Abs Immature Granulocytes: 0.02 10*3/uL (ref 0.00–0.07)
Basophils Absolute: 0 10*3/uL (ref 0.0–0.1)
Basophils Relative: 0 %
Eosinophils Absolute: 0.1 10*3/uL (ref 0.0–0.5)
Eosinophils Relative: 1 %
HCT: 32.3 % — ABNORMAL LOW (ref 39.0–52.0)
Hemoglobin: 11 g/dL — ABNORMAL LOW (ref 13.0–17.0)
Immature Granulocytes: 0 %
Lymphocytes Relative: 17 %
Lymphs Abs: 1.5 10*3/uL (ref 0.7–4.0)
MCH: 32 pg (ref 26.0–34.0)
MCHC: 34.1 g/dL (ref 30.0–36.0)
MCV: 93.9 fL (ref 80.0–100.0)
Monocytes Absolute: 1 10*3/uL (ref 0.1–1.0)
Monocytes Relative: 12 %
Neutro Abs: 5.9 10*3/uL (ref 1.7–7.7)
Neutrophils Relative %: 70 %
Platelets: 274 10*3/uL (ref 150–400)
RBC: 3.44 MIL/uL — ABNORMAL LOW (ref 4.22–5.81)
RDW: 15.5 % (ref 11.5–15.5)
WBC: 8.5 10*3/uL (ref 4.0–10.5)
nRBC: 0 % (ref 0.0–0.2)

## 2019-12-05 LAB — COMPREHENSIVE METABOLIC PANEL
ALT: 16 U/L (ref 0–44)
AST: 27 U/L (ref 15–41)
Albumin: 2.6 g/dL — ABNORMAL LOW (ref 3.5–5.0)
Alkaline Phosphatase: 66 U/L (ref 38–126)
Anion gap: 8 (ref 5–15)
BUN: 10 mg/dL (ref 8–23)
CO2: 23 mmol/L (ref 22–32)
Calcium: 8.3 mg/dL — ABNORMAL LOW (ref 8.9–10.3)
Chloride: 105 mmol/L (ref 98–111)
Creatinine, Ser: 0.61 mg/dL (ref 0.61–1.24)
GFR calc Af Amer: >60 mL/min (ref >60)
GFR calc non Af Amer: >60 mL/min (ref >60)
Glucose, Bld: 115 mg/dL — ABNORMAL HIGH (ref 70–99)
Potassium: 3.5 mmol/L (ref 3.5–5.1)
Sodium: 136 mmol/L (ref 135–145)
Total Bilirubin: 0.9 mg/dL (ref 0.3–1.2)
Total Protein: 6.1 g/dL — ABNORMAL LOW (ref 6.5–8.1)

## 2019-12-05 LAB — MAGNESIUM: Magnesium: 1.6 mg/dL — ABNORMAL LOW (ref 1.7–2.4)

## 2019-12-05 LAB — PHOSPHORUS: Phosphorus: 3.8 mg/dL (ref 2.5–4.6)

## 2019-12-05 MED ORDER — ONDANSETRON HCL 4 MG/2ML IJ SOLN
8.0000 mg | Freq: Once | INTRAMUSCULAR | Status: AC
  Administered 2019-12-05: 8 mg via INTRAVENOUS
  Filled 2019-12-05: qty 4

## 2019-12-05 MED ORDER — HYDROMORPHONE HCL 1 MG/ML IJ SOLN
0.5000 mg | Freq: Once | INTRAMUSCULAR | Status: DC
  Filled 2019-12-05: qty 0.5

## 2019-12-05 MED ORDER — SODIUM CHLORIDE 0.9 % IV SOLN
10.0000 mg | Freq: Once | INTRAVENOUS | Status: AC
  Administered 2019-12-05: 10 mg via INTRAVENOUS
  Filled 2019-12-05: qty 10

## 2019-12-05 MED ORDER — MAGNESIUM SULFATE 2 GM/50ML IV SOLN
2.0000 g | Freq: Once | INTRAVENOUS | Status: AC
  Administered 2019-12-05: 2 g via INTRAVENOUS
  Filled 2019-12-05: qty 50

## 2019-12-05 MED ORDER — SENNA 8.6 MG PO TABS: 2.0000 | ORAL_TABLET | Freq: Two times a day (BID) | ORAL | 0 refills | Status: AC

## 2019-12-05 MED ORDER — SODIUM CHLORIDE 0.9 % IV SOLN: Freq: Once | INTRAVENOUS | Status: DC

## 2019-12-05 MED ORDER — SODIUM CHLORIDE 0.9 % IV SOLN
Freq: Once | INTRAVENOUS | Status: AC
  Filled 2019-12-05: qty 250

## 2019-12-05 MED ORDER — OXYCODONE HCL 5 MG PO TABS: 5.0000 mg | ORAL_TABLET | Freq: Four times a day (QID) | ORAL | 0 refills | Status: AC | PRN

## 2019-12-05 MED ORDER — POLYETHYLENE GLYCOL 3350 17 G PO PACK
17.0000 g | PACK | Freq: Every day | ORAL | 0 refills | Status: AC | PRN
Start: 2019-12-05 — End: ?

## 2019-12-05 MED ORDER — FENTANYL 12 MCG/HR TD PT72: 1.0000 | MEDICATED_PATCH | TRANSDERMAL | 0 refills | Status: AC

## 2019-12-05 NOTE — Progress Notes (Addendum)
[facility]  Telephone:([phone] [phone]   Name: Vernon Patterson Date: 12/05/2019 MRN: [id number]  DOB: 1956-01-05  Patient Care Team: Albina Billet, MD as PCP - General (Internal Medicine) Dionisio David, MD (Cardiology) Clent Jacks, RN as Oncology Nurse Navigator Earlie Server, MD as Consulting Physician (Oncology)    REASON FOR CONSULTATION: Vernon Patterson is a 64 y.o. male with multiple medical problems including CAD, severe aortic stenosis, depression/anxiety, who was hospitalized 11/23/2019-11/28/2019 with GI bleed.  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  Additionally, patient was found to have a left piriform sinus mass, which could reflect metastatic disease versus second primary.  Patient was found to have severe aortic stenosis but is not felt to be amenable to treatment at the present time given his ongoing issues with cancer.  He has had significant difficulty with eating and drinking with progressive weight loss.  Patient was referred to palliative care to help address goals and manage ongoing symptoms.  SOCIAL HISTORY:     reports that he quit smoking 11 days ago. His smoking use included cigars. He has never used smokeless tobacco. He reports current alcohol use. He reports that he does not use drugs.  Patient is divorced.  He lives at home alone but his daughter from [place] is currently staying with him.  He has another daughter in [place].  Patient previously worked for [employer].  ADVANCE DIRECTIVES:  On file  CODE STATUS:   PAST MEDICAL HISTORY: Past Medical History:  Diagnosis Date  . Anxiety   . Coronary artery disease   . Depression   . Heart murmur   . Hypertension   . Nausea and vomiting 12/01/2019    PAST SURGICAL HISTORY:  Past Surgical History:  Procedure Laterality Date  . CARDIAC  CATHETERIZATION    . CORONARY ARTERY BYPASS GRAFT    . CORONARY ARTERY BYPASS GRAFT N/A 04/18/2013   Procedure: CORONARY ARTERY BYPASS GRAFTING (CABG);  Surgeon: Gaye Pollack, MD;  Location: [facility];  Service: Open Heart Surgery;  Laterality: N/A;  Times  3  using left internal mammary artery and endoscopically harvested right saphenous vein  . ENDOVEIN HARVEST OF GREATER SAPHENOUS VEIN Right 04/18/2013   Procedure: ENDOVEIN HARVEST OF GREATER SAPHENOUS VEIN;  Surgeon: Gaye Pollack, MD;  Location: [facility];  Service: Open Heart Surgery;  Laterality: Right;  Some of the saphenous vein from the lower leg also used.  . ESOPHAGOGASTRODUODENOSCOPY (EGD) WITH PROPOFOL N/A 11/26/2019   Procedure: ESOPHAGOGASTRODUODENOSCOPY (EGD) WITH PROPOFOL;  Surgeon: Lin Landsman, MD;  Location: [facility];  Service: Gastroenterology;  Laterality: N/A;  . INTRAOPERATIVE TRANSESOPHAGEAL ECHOCARDIOGRAM N/A 04/18/2013   Procedure: INTRAOPERATIVE TRANSESOPHAGEAL ECHOCARDIOGRAM;  Surgeon: Gaye Pollack, MD;  Location: [facility];  Service: Open Heart Surgery;  Laterality: N/A;    HEMATOLOGY/ONCOLOGY HISTORY:  Oncology History   No history exists.    ALLERGIES:  has No Known Allergies.  MEDICATIONS:  Current Outpatient Medications  Medication Sig Dispense Refill  . acetaminophen (TYLENOL) 325 MG tablet Take 650 mg by mouth every 6 (six) hours as needed.    . fentaNYL (DURAGESIC) 12 MCG/HR Place 1 patch onto the skin every 3 (three) days. 5 patch 0  . ferrous sulfate 325 (65 FE) MG tablet Take 1 tablet (325 mg total) by mouth daily with breakfast. 30 tablet  0  . magnesium chloride (SLOW-MAG) 64 MG TBEC SR tablet Take 1 tablet (64 mg total) by mouth daily. 30 tablet 0  . metoprolol tartrate (LOPRESSOR) 25 MG tablet Take 25 mg by mouth daily.    . ondansetron (ZOFRAN ODT) 8 MG disintegrating tablet Take 1 tablet (8 mg total) by mouth every 6 (six) hours as needed for nausea or vomiting. 60 tablet 1  . oxyCODONE (OXY  IR/ROXICODONE) 5 MG immediate release tablet Take 1 tablet (5 mg total) by mouth every 6 (six) hours as needed for severe pain. 30 tablet 0  . pantoprazole (PROTONIX) 40 MG tablet Take 1 tablet (40 mg total) by mouth 2 (two) times daily. 60 tablet 0  . polyethylene glycol (MIRALAX) 17 g packet Take 17 g by mouth daily as needed. 14 each 0  . prochlorperazine (COMPAZINE) 10 MG tablet Take 1 tablet (10 mg total) by mouth every 6 (six) hours as needed for nausea or vomiting. 60 tablet 0  . senna (SENOKOT) 8.6 MG TABS tablet Take 2 tablets (17.2 mg total) by mouth in the morning and at bedtime. 120 tablet 0  . traZODone (DESYREL) 50 MG tablet Take 50 mg by mouth at bedtime.     No current facility-administered medications for this visit.   Facility-Administered Medications Ordered in Other Visits  Medication Dose Route Frequency Provider Last Rate Last Admin  . HYDROmorphone (DILAUDID) injection 0.5 mg  0.5 mg Intravenous Once Earlie Server, MD      . sodium chloride flush (NS) 0.9 % injection 3 mL  3 mL Intravenous Q12H Visser, Jacquelyn D, PA-C        VITAL SIGNS: There were no vitals taken for this visit. There were no vitals filed for this visit.  Estimated body mass index is 23.58 kg/m as calculated from the following:   Height as of 11/26/19: _0  (1.727 m).   Weight as of an earlier encounter on 12/05/19: 155 lb 1.6 oz (70.4 kg).  LABS: CBC:    Component Value Date/Time   WBC 8.5 12/05/2019 0805   HGB 11.0 (L) 12/05/2019 0805   HGB 14.6 04/18/2013 0454   HCT 32.3 (L) 12/05/2019 0805   HCT 42.9 04/16/2013 0419   PLT 274 12/05/2019 0805   PLT 117 (L) 04/18/2013 0454   MCV 93.9 12/05/2019 0805   MCV 101 (H) 04/16/2013 0419   NEUTROABS 5.9 12/05/2019 0805   LYMPHSABS 1.5 12/05/2019 0805   MONOABS 1.0 12/05/2019 0805   EOSABS 0.1 12/05/2019 0805   BASOSABS 0.0 12/05/2019 0805   Comprehensive Metabolic Panel:    Component Value Date/Time   NA 136 12/05/2019 0805   NA 137  04/16/2013 0419   K 3.5 12/05/2019 0805   K 3.8 04/16/2013 0419   CL 105 12/05/2019 0805   CL 105 04/16/2013 0419   CO2 23 12/05/2019 0805   CO2 23 04/16/2013 0419   BUN 10 12/05/2019 0805   BUN 9 04/16/2013 0419   CREATININE 0.61 12/05/2019 0805   CREATININE 0.74 04/16/2013 0419   GLUCOSE 115 (H) 12/05/2019 0805   GLUCOSE 98 04/16/2013 0419   CALCIUM 8.3 (L) 12/05/2019 0805   CALCIUM 8.8 04/16/2013 0419   AST 27 12/05/2019 0805   AST 161 (H) 04/16/2013 0419   ALT 16 12/05/2019 0805   ALT 173 (H) 04/16/2013 0419   ALKPHOS 66 12/05/2019 0805   ALKPHOS 64 04/16/2013 0419   BILITOT 0.9 12/05/2019 0805   BILITOT 0.5 04/16/2013 0419   PROT  6.1 (L) 12/05/2019 0805   PROT 7.2 04/16/2013 0419   ALBUMIN 2.6 (L) 12/05/2019 0805   ALBUMIN 3.5 04/16/2013 0419    RADIOGRAPHIC STUDIES: DG Chest 2 View  Result Date: 11/26/2019 CLINICAL DATA:  Cough and weakness EXAM: CHEST - 2 VIEW COMPARISON:  Chest radiograph 10/19/2019 FINDINGS: Stable cardiomediastinal contours status post median sternotomy. Minimal atelectasis at the right lung base. No focal consolidation. On lateral view there is a 9 mm nodule inferiorly which is not well seen on the frontal projection. No pneumothorax or pleural effusion. No acute finding in the visualized skeleton. IMPRESSION: 1. No evidence of active disease in the chest. 2. 9 mm nodule inferiorly on the lateral view which is not well seen on the frontal projection. Consider nonemergent chest CT for further evaluation. Electronically Signed   By: Audie Pinto M.D.   On: 11/26/2019 17:12   ECHOCARDIOGRAM COMPLETE  Result Date: 11/25/2019    ECHOCARDIOGRAM REPORT   Patient Name:   KEYTON BHAT Date of Exam: 11/25/2019 Medical Rec #:  [id number]      Height:       68.0 in Accession #:    [id number]     Weight:       129.9 lb Date of Birth:  1956/01/11      BSA:          1.701 m Patient Age:    68 years       BP:           143/81 mmHg Patient Gender: M               HR:           58 bpm. Exam Location:  [facility] Procedure: 2D Echo, Color Doppler and Cardiac Doppler Indications:     R01.2 Abnormal Heart Sounds Nec  History:         Patient has no prior history of Echocardiogram examinations.                  CAD; Prior CABG.  Sonographer:     Charmayne Sheer RDCS (AE) Referring Phys:  [id number] Arvil Chaco Diagnosing Phys: Ida Rogue MD IMPRESSIONS  1. Left ventricular ejection fraction, by estimation, is 60 to 65%. The left ventricle has normal function. The left ventricle has no regional wall motion abnormalities. Left ventricular diastolic parameters are consistent with Grade II diastolic dysfunction (pseudonormalization).  2. Right ventricular systolic function is normal. The right ventricular size is normal. There is mildly elevated pulmonary artery systolic pressure.  3. Left atrial size was mildly dilated.  4. The aortic valve is heavily calcified. Aortic valve regurgitation is mild. Severe aortic valve stenosis. Aortic valve area, by VTI measures 0.86 cm. Aortic valve mean gradient measures 38.0 mmHg. Aortic valve Vmax measures 4.02 m/s. FINDINGS  Left Ventricle: Left ventricular ejection fraction, by estimation, is 60 to 65%. The left ventricle has normal function. The left ventricle has no regional wall motion abnormalities. The left ventricular internal cavity size was normal in size. There is  no left ventricular hypertrophy. Left ventricular diastolic parameters are consistent with Grade II diastolic dysfunction (pseudonormalization). Right Ventricle: The right ventricular size is normal. No increase in right ventricular wall thickness. Right ventricular systolic function is normal. There is mildly elevated pulmonary artery systolic pressure. The tricuspid regurgitant velocity is 2.53  m/s, and with an assumed right atrial pressure of 10 mmHg, the estimated right ventricular systolic pressure is 50.5 mmHg. Left Atrium: Left atrial  size was mildly dilated. Right  Atrium: Right atrial size was normal in size. Pericardium: There is no evidence of pericardial effusion. Mitral Valve: The mitral valve is normal in structure. There is mild thickening of the mitral valve leaflet(s). Normal mobility of the mitral valve leaflets. Mild mitral valve regurgitation. No evidence of mitral valve stenosis. MV peak gradient, 4.2 mmHg. The mean mitral valve gradient is 2.0 mmHg. Tricuspid Valve: The tricuspid valve is normal in structure. Tricuspid valve regurgitation is not demonstrated. No evidence of tricuspid stenosis. Aortic Valve: The aortic valve is normal in structure. Aortic valve regurgitation is mild. Severe aortic stenosis is present. Aortic valve mean gradient measures 38.0 mmHg. Aortic valve peak gradient measures 64.6 mmHg. Aortic valve area, by VTI measures  0.86 cm. Pulmonic Valve: The pulmonic valve was normal in structure. Pulmonic valve regurgitation is not visualized. No evidence of pulmonic stenosis. Aorta: The aortic root is normal in size and structure. Venous: The inferior vena cava is normal in size with greater than 50% respiratory variability, suggesting right atrial pressure of 3 mmHg. IAS/Shunts: No atrial level shunt detected by color flow Doppler.  LEFT VENTRICLE PLAX 2D LVIDd:         3.94 cm  Diastology LVIDs:         2.13 cm  LV e' lateral:   9.90 cm/s LV PW:         1.26 cm  LV E/e' lateral: 10.4 LV IVS:        1.13 cm  LV e' medial:    5.22 cm/s LVOT diam:     2.40 cm  LV E/e' medial:  19.7 LV SV:         89 LV SV Index:   52 LVOT Area:     4.52 cm  RIGHT VENTRICLE RV Basal diam:  3.15 cm LEFT ATRIUM             Index       RIGHT ATRIUM           Index LA diam:        4.20 cm 2.47 cm/m  RA Area:     12.70 cm LA Vol (A2C):   62.5 ml 36.74 ml/m RA Volume:   27.80 ml  16.34 ml/m LA Vol (A4C):   48.2 ml 28.33 ml/m LA Biplane Vol: 55.2 ml 32.45 ml/m  AORTIC VALVE                    PULMONIC VALVE AV Area (Vmax):    0.82 cm     PV Vmax:       2.01 m/s  AV Area (Vmean):   0.95 cm     PV Vmean:      135.000 cm/s AV Area (VTI):     0.86 cm     PV VTI:        0.470 m AV Vmax:           402.00 cm/s  PV Peak grad:  16.2 mmHg AV Vmean:          247.500 cm/s PV Mean grad:  9.0 mmHg AV VTI:            1.040 m AV Peak Grad:      64.6 mmHg AV Mean Grad:      38.0 mmHg LVOT Vmax:         72.50 cm/s LVOT Vmean:        51.900 cm/s LVOT VTI:  0.197 m LVOT/AV VTI ratio: 0.19  AORTA Ao Root diam: 3.80 cm MITRAL VALVE                TRICUSPID VALVE MV Area (PHT): 4.04 cm     TR Peak grad:   25.6 mmHg MV Peak grad:  4.2 mmHg     TR Vmax:        253.00 cm/s MV Mean grad:  2.0 mmHg MV Vmax:       1.02 m/s     SHUNTS MV Vmean:      58.1 cm/s    Systemic VTI:  0.20 m MV Decel Time: 188 msec     Systemic Diam: 2.40 cm MV E velocity: 103.00 cm/s MV A velocity: 45.00 cm/s MV E/A ratio:  2.29 Ida Rogue MD Electronically signed by Ida Rogue MD Signature Date/Time: 11/25/2019/2:19:12 PM    Final     PERFORMANCE STATUS (ECOG) : 3 - Symptomatic, >50% confined to bed  Review of Systems Unless otherwise noted, a complete review of systems is negative.  Physical Exam General: NAD Pulmonary: Unlabored Extremities: no edema, no joint deformities Skin: no rashes Neurological: Weakness but otherwise nonfocal  IMPRESSION: I met with patient in the infusion area I met privately with his daughter.  Patient reports feeling okay without any acute symptomatic complaints at present.  He has had severe abdominal pain and has been taking acetaminophen without much relief.  He was started on oxycodone today.  His oral intake is minimal.  He was seen by dietitian today with recommendation to consider artificial nutrition as he seems unlikely to be able to keep up with his nutritional needs, especially if he pursues treatment.  We will plan to see patient in [facility] every few days as he will likely need IV fluids.  We did discuss option of a PEG.  Patient is also having  constipation we discussed adjustment of his bowel regimen with increasing his daily Senokot.  MiraLAX was discussed but doubt the patient will be able to drink enough to make it a viable option.  Patient's daughter plans to speak with patient and her sister regarding overall goals.  She asked about the option of hospice in the event that patient decides not to pursue treatment.  We will go ahead and order home-based palliative care.  We talked about the option of home health as patient remains severely weak.  Patient declined this today.  Patient was able to take a shower today for the first time in 3 weeks but family were concerned that he was going to fall.  He would probably benefit from home DME including a shower chair.  Patient's daughter took home a MOST form to discuss with her family.   PLAN: -Work-up pending -Patient will have PET scan and see Dr. Baruch Gouty on Wednesday -Agree with oxycodone.  Liberalize frequency if needed -Liberalize bowel regimen with OTC Senokot -Referral to home-based palliative care -Consider home health -[place] diagnostic/therapeutic paracentesis -RTC in 1 week   Patient expressed understanding and was in agreement with this plan. He also understands that He can Strain the clinic at any time with any questions, concerns, or complaints.     Time Total: 30 minutes  Visit consisted of counseling and education dealing with the complex and emotionally intense issues of symptom management and palliative care in the setting of serious and potentially life-threatening illness.Greater than 50%  of this time was spent counseling and coordinating care related to the above assessment and plan.  Signed by: Altha Harm, PhD, NP-C

## 2019-12-05 NOTE — Progress Notes (Signed)
Hematology/Oncology Follow Up Note [facility]  [phone] [phone]  Patient Care Team: Albina Billet, MD as PCP - General (Internal Medicine) Dionisio David, MD (Cardiology) Clent Jacks, RN as Oncology Nurse Navigator Earlie Server, MD as Consulting Physician (Oncology)   Name of the patient: Vernon Patterson  [id number]  1955-05-18   REASON FOR VISIT  follow-up for squamous cell carcinoma  PERTINENT ONCOLOGY HISTORY Vernon Patterson is a 64 y.o. 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   Patient was recently admitted due to profound weakness, unintentional weight loss, dark stool, nausea and vomiting. EGD showed gastric cardia mass, malignant appearing GE junction stenosis, left piriform sinus ulceration.  Gastric cardia mass was biopsied.  CBC showed hemoglobin of 7.4.  He received PRBC transfusion. Physical examination reviewed significant systolic murmur.  Patient was seen by cardiologist. Patient was given supportive care including IV fluid hydration, antiemetics, PPI. Also was evaluated by ENT.  INTERVAL HISTORY Vernon Patterson is a 64 y.o. male who has above history reviewed by me today presents for follow up visit for management of squamous cell carcinoma Problems and complaints are listed below: He felt better for 2 days after last visit. Appetite improved and then decreased again. He feels more bloated and also have back pain, rate it 6/10.  Some nausea, Zofran ODT helps his symptoms. No vomiting.  Weight increased 2 pounds since last week.       Review of Systems  Constitutional: Positive for appetite change, fatigue and unexpected weight change. Negative for chills and fever.  HENT:   Negative for hearing loss and voice change.   Eyes: Negative for eye problems and icterus.    Respiratory: Negative for chest tightness, cough and shortness of breath.   Cardiovascular: Negative for chest pain and leg swelling.  Gastrointestinal: Positive for nausea and vomiting. Negative for abdominal distention and abdominal pain.  Endocrine: Negative for hot flashes.  Genitourinary: Negative for difficulty urinating, dysuria and frequency.   Musculoskeletal: Negative for arthralgias.  Skin: Negative for itching and rash.  Neurological: Negative for light-headedness and numbness.  Hematological: Negative for adenopathy. Does not bruise/bleed easily.  Psychiatric/Behavioral: Negative for confusion.      No Known Allergies   Past Medical History:  Diagnosis Date  . Anxiety   . Coronary artery disease   . Depression   . Heart murmur   . Hypertension   . Nausea and vomiting 12/01/2019     Past Surgical History:  Procedure Laterality Date  . CARDIAC CATHETERIZATION    . CORONARY ARTERY BYPASS GRAFT    . CORONARY ARTERY BYPASS GRAFT N/A 04/18/2013   Procedure: CORONARY ARTERY BYPASS GRAFTING (CABG);  Surgeon: Gaye Pollack, MD;  Location: [place];  Service: Open Heart Surgery;  Laterality: N/A;  Times  3  using left internal mammary artery and endoscopically harvested right saphenous vein  . ENDOVEIN HARVEST OF GREATER SAPHENOUS VEIN Right 04/18/2013   Procedure: ENDOVEIN HARVEST OF GREATER SAPHENOUS VEIN;  Surgeon: Gaye Pollack, MD;  Location: [place];  Service: Open Heart Surgery;  Laterality: Right;  Some of the saphenous vein from the lower leg also used.  . ESOPHAGOGASTRODUODENOSCOPY (EGD) WITH PROPOFOL N/A 11/26/2019   Procedure: ESOPHAGOGASTRODUODENOSCOPY (EGD) WITH PROPOFOL;  Surgeon: Lin Landsman, MD;  Location: [place];  Service: Gastroenterology;  Laterality: N/A;  . INTRAOPERATIVE TRANSESOPHAGEAL  ECHOCARDIOGRAM N/A 04/18/2013   Procedure: INTRAOPERATIVE TRANSESOPHAGEAL ECHOCARDIOGRAM;  Surgeon: Gaye Pollack, MD;  Location: [facility];  Service: Open Heart  Surgery;  Laterality: N/A;    Social History   Socioeconomic History  . Marital status: Married    Spouse name: Not on file  . Number of children: Not on file  . Years of education: Not on file  . Highest education level: Not on file  Occupational History  . Occupation: not employed     Fish farm manager: [employer]  Tobacco Use  . Smoking status: Former Smoker    Types: Cigars    Quit date: 11/24/2019    Years since quitting: 0.0  . Smokeless tobacco: Never Used  . Tobacco comment: 1-2 cigars/day  Vaping Use  . Vaping Use: Never used  Substance and Sexual Activity  . Alcohol use: Yes    Comment: 5 bottles of wine/week/ Quit drinking about 5 months   . Drug use: No  . Sexual activity: Never  Other Topics Concern  . Not on file  Social History Narrative  . Not on file   Social Determinants of Health   Financial Resource Strain:   . Difficulty of Paying Living Expenses:   Food Insecurity:   . Worried About Charity fundraiser in the Last Year:   . Arboriculturist in the Last Year:   Transportation Needs:   . Film/video editor (Medical):   Marland Kitchen Lack of Transportation (Non-Medical):   Physical Activity:   . Days of Exercise per Week:   . Minutes of Exercise per Session:   Stress:   . Feeling of Stress :   Social Connections:   . Frequency of Communication with Friends and Family:   . Frequency of Social Gatherings with Friends and Family:   . Attends Religious Services:   . Active Member of Clubs or Organizations:   . Attends Archivist Meetings:   Marland Kitchen Marital Status:   Intimate Partner Violence:   . Fear of Current or Ex-Partner:   . Emotionally Abused:   Marland Kitchen Physically Abused:   . Sexually Abused:     Family History  Problem Relation Age of Onset  . Diabetes Mother      Current Outpatient Medications:  .  acetaminophen (TYLENOL) 325 MG tablet, Take 650 mg by mouth every 6 (six) hours as needed., Disp: , Rfl:  .  ferrous sulfate 325 (65 FE) MG  tablet, Take 1 tablet (325 mg total) by mouth daily with breakfast., Disp: 30 tablet, Rfl: 0 .  magnesium chloride (SLOW-MAG) 64 MG TBEC SR tablet, Take 1 tablet (64 mg total) by mouth daily., Disp: 30 tablet, Rfl: 0 .  metoprolol tartrate (LOPRESSOR) 25 MG tablet, Take 25 mg by mouth daily., Disp: , Rfl:  .  ondansetron (ZOFRAN ODT) 8 MG disintegrating tablet, Take 1 tablet (8 mg total) by mouth every 6 (six) hours as needed for nausea or vomiting., Disp: 60 tablet, Rfl: 1 .  pantoprazole (PROTONIX) 40 MG tablet, Take 1 tablet (40 mg total) by mouth 2 (two) times daily., Disp: 60 tablet, Rfl: 0 .  prochlorperazine (COMPAZINE) 10 MG tablet, Take 1 tablet (10 mg total) by mouth every 6 (six) hours as needed for nausea or vomiting., Disp: 60 tablet, Rfl: 0 .  traZODone (DESYREL) 50 MG tablet, Take 50 mg by mouth at bedtime., Disp: , Rfl:  No current facility-administered medications for this visit.  Facility-Administered Medications Ordered in Other Visits:  .  sodium chloride flush (NS) 0.9 % injection 3 mL, 3 mL, Intravenous, Q12H, Arvil Chaco, [place]  Physical exam:  Vitals:   12/05/19 0852  BP: (!) 139/88  Pulse: 58  Resp: 18  Temp: (!) 96.4 F (35.8 C)  Weight: 155 lb 1.6 oz (70.4 kg)   Physical Exam Constitutional:      General: He is not in acute distress.    Appearance: He is ill-appearing.     Comments: Cachectic patient sits in the wheelchair  HENT:     Head: Normocephalic and atraumatic.  Eyes:     General: No scleral icterus. Cardiovascular:     Rate and Rhythm: Normal rate and regular rhythm.     Heart sounds: Murmur heard.   Pulmonary:     Effort: Pulmonary effort is normal. No respiratory distress.     Breath sounds: Normal breath sounds. No wheezing.  Abdominal:     General: Bowel sounds are normal. There is distension.     Palpations: Abdomen is soft.  Musculoskeletal:        General: Swelling present. No deformity. Normal range of motion.     Cervical  back: Normal range of motion and neck supple.     Comments: Bilateral pedal edema 2+  Skin:    General: Skin is warm and dry.     Findings: No erythema or rash.  Neurological:     Mental Status: He is alert and oriented to person, place, and time. Mental status is at baseline.     Cranial Nerves: No cranial nerve deficit.     Coordination: Coordination normal.  Psychiatric:        Mood and Affect: Mood normal.     CMP Latest Ref Rng & Units 12/01/2019  Glucose 70 - 99 mg/dL 114(H)  BUN 8 - 23 mg/dL 7(L)  Creatinine 0.61 - 1.24 mg/dL 0.61  Sodium 135 - 145 mmol/L 135  Potassium 3.5 - 5.1 mmol/L 4.2  Chloride 98 - 111 mmol/L 108  CO2 22 - 32 mmol/L 21(L)  Calcium 8.9 - 10.3 mg/dL 8.2(L)  Total Protein 6.5 - 8.1 g/dL 5.9(L)  Total Bilirubin 0.3 - 1.2 mg/dL 1.0  Alkaline Phos 38 - 126 U/L 65  AST 15 - 41 U/L 22  ALT 0 - 44 U/L 14   CBC Latest Ref Rng & Units 12/05/2019  WBC 4.0 - 10.5 K/uL 8.5  Hemoglobin 13.0 - 17.0 g/dL 11.0(L)  Hematocrit 39 - 52 % 32.3(L)  Platelets 150 - 400 K/uL 274    RADIOGRAPHIC STUDIES: I have personally reviewed the radiological images as listed and agreed with the findings in the report. DG Chest 2 View  Result Date: 11/26/2019 CLINICAL DATA:  Cough and weakness EXAM: CHEST - 2 VIEW COMPARISON:  Chest radiograph 10/19/2019 FINDINGS: Stable cardiomediastinal contours status post median sternotomy. Minimal atelectasis at the right lung base. No focal consolidation. On lateral view there is a 9 mm nodule inferiorly which is not well seen on the frontal projection. No pneumothorax or pleural effusion. No acute finding in the visualized skeleton. IMPRESSION: 1. No evidence of active disease in the chest. 2. 9 mm nodule inferiorly on the lateral view which is not well seen on the frontal projection. Consider nonemergent chest CT for further evaluation. Electronically Signed   By: Audie Pinto M.D.   On: 11/26/2019 17:12   ECHOCARDIOGRAM  COMPLETE  Result Date: 11/25/2019    ECHOCARDIOGRAM REPORT   Patient Name:   COCHISE DINNEEN Gallien Date  of Exam: 11/25/2019 Medical Rec #:  [id number]      Height:       68.0 in Accession #:    [id number]     Weight:       129.9 lb Date of Birth:  [birth year]      BSA:          1.701 m Patient Age:    4 years       BP:           143/81 mmHg Patient Gender: M              HR:           58 bpm. Exam Location:  [facility] Procedure: 2D Echo, Color Doppler and Cardiac Doppler Indications:     R01.2 Abnormal Heart Sounds Nec  History:         Patient has no prior history of Echocardiogram examinations.                  CAD; Prior CABG.  Sonographer:     Charmayne Sheer RDCS (AE) Referring Phys:  [id number] Arvil Chaco Diagnosing Phys: Ida Rogue MD IMPRESSIONS  1. Left ventricular ejection fraction, by estimation, is 60 to 65%. The left ventricle has normal function. The left ventricle has no regional wall motion abnormalities. Left ventricular diastolic parameters are consistent with Grade II diastolic dysfunction (pseudonormalization).  2. Right ventricular systolic function is normal. The right ventricular size is normal. There is mildly elevated pulmonary artery systolic pressure.  3. Left atrial size was mildly dilated.  4. The aortic valve is heavily calcified. Aortic valve regurgitation is mild. Severe aortic valve stenosis. Aortic valve area, by VTI measures 0.86 cm. Aortic valve mean gradient measures 38.0 mmHg. Aortic valve Vmax measures 4.02 m/s. FINDINGS  Left Ventricle: Left ventricular ejection fraction, by estimation, is 60 to 65%. The left ventricle has normal function. The left ventricle has no regional wall motion abnormalities. The left ventricular internal cavity size was normal in size. There is  no left ventricular hypertrophy. Left ventricular diastolic parameters are consistent with Grade II diastolic dysfunction (pseudonormalization). Right Ventricle: The right ventricular size is normal. No increase in  right ventricular wall thickness. Right ventricular systolic function is normal. There is mildly elevated pulmonary artery systolic pressure. The tricuspid regurgitant velocity is 2.53  m/s, and with an assumed right atrial pressure of 10 mmHg, the estimated right ventricular systolic pressure is 14.9 mmHg. Left Atrium: Left atrial size was mildly dilated. Right Atrium: Right atrial size was normal in size. Pericardium: There is no evidence of pericardial effusion. Mitral Valve: The mitral valve is normal in structure. There is mild thickening of the mitral valve leaflet(s). Normal mobility of the mitral valve leaflets. Mild mitral valve regurgitation. No evidence of mitral valve stenosis. MV peak gradient, 4.2 mmHg. The mean mitral valve gradient is 2.0 mmHg. Tricuspid Valve: The tricuspid valve is normal in structure. Tricuspid valve regurgitation is not demonstrated. No evidence of tricuspid stenosis. Aortic Valve: The aortic valve is normal in structure. Aortic valve regurgitation is mild. Severe aortic stenosis is present. Aortic valve mean gradient measures 38.0 mmHg. Aortic valve peak gradient measures 64.6 mmHg. Aortic valve area, by VTI measures  0.86 cm. Pulmonic Valve: The pulmonic valve was normal in structure. Pulmonic valve regurgitation is not visualized. No evidence of pulmonic stenosis. Aorta: The aortic root is normal in size and structure. Venous: The inferior vena cava is normal in size with greater than  50% respiratory variability, suggesting right atrial pressure of 3 mmHg. IAS/Shunts: No atrial level shunt detected by color flow Doppler.  LEFT VENTRICLE PLAX 2D LVIDd:         3.94 cm  Diastology LVIDs:         2.13 cm  LV e' lateral:   9.90 cm/s LV PW:         1.26 cm  LV E/e' lateral: 10.4 LV IVS:        1.13 cm  LV e' medial:    5.22 cm/s LVOT diam:     2.40 cm  LV E/e' medial:  19.7 LV SV:         89 LV SV Index:   52 LVOT Area:     4.52 cm  RIGHT VENTRICLE RV Basal diam:  3.15 cm LEFT  ATRIUM             Index       RIGHT ATRIUM           Index LA diam:        4.20 cm 2.47 cm/m  RA Area:     12.70 cm LA Vol (A2C):   62.5 ml 36.74 ml/m RA Volume:   27.80 ml  16.34 ml/m LA Vol (A4C):   48.2 ml 28.33 ml/m LA Biplane Vol: 55.2 ml 32.45 ml/m  AORTIC VALVE                    PULMONIC VALVE AV Area (Vmax):    0.82 cm     PV Vmax:       2.01 m/s AV Area (Vmean):   0.95 cm     PV Vmean:      135.000 cm/s AV Area (VTI):     0.86 cm     PV VTI:        0.470 m AV Vmax:           402.00 cm/s  PV Peak grad:  16.2 mmHg AV Vmean:          247.500 cm/s PV Mean grad:  9.0 mmHg AV VTI:            1.040 m AV Peak Grad:      64.6 mmHg AV Mean Grad:      38.0 mmHg LVOT Vmax:         72.50 cm/s LVOT Vmean:        51.900 cm/s LVOT VTI:          0.197 m LVOT/AV VTI ratio: 0.19  AORTA Ao Root diam: 3.80 cm MITRAL VALVE                TRICUSPID VALVE MV Area (PHT): 4.04 cm     TR Peak grad:   25.6 mmHg MV Peak grad:  4.2 mmHg     TR Vmax:        253.00 cm/s MV Mean grad:  2.0 mmHg MV Vmax:       1.02 m/s     SHUNTS MV Vmean:      58.1 cm/s    Systemic VTI:  0.20 m MV Decel Time: 188 msec     Systemic Diam: 2.40 cm MV E velocity: 103.00 cm/s MV A velocity: 45.00 cm/s MV E/A ratio:  2.29 Ida Rogue MD Electronically signed by Ida Rogue MD Signature Date/Time: 11/25/2019/2:19:12 PM    Final      Assessment and plan 1. Distended abdomen   2. Nausea and  vomiting, intractability of vomiting not specified, unspecified vomiting type   3. 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 Hypomagnesemia    #Gastric cardia mass, malignant appearing GE junction stenosis Biopsy showed squamous cell carcinoma. I have discussed with pathologist and ask to add a p16 staining. Patient will see ENT for left piriform sinus ulceration biopsy-?  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 PET scan was scheduled on 12/07/2019-earliest appointment Additional  management plan depends on PET scan. Patient has poor performance status and multiple comorbidities.  Not medically fit for surgery. If 2 primaries with no distant metastasis, consider concurrent chemotherapy radiation #If distant metastasis, patient may still need palliative radiation due to stenosis.  Refer to radiation oncology and if he tolerates, may onsider systemic chemotherapy. Check NGS Goal of care, prognosis poor given his poor performance status.  I will refer patient to establish care with palliative care service.  #Nausea vomiting and poor oral intake, IV fluid 1L NS over 1 hour,  IV dex 10mg  x 1, IV Zofran 8mg   Continue Zofran ODT.  # distended abdomen, ? Ascites. Check stat US abdomen.  # Weight loss, he will see nutritionist.  #Hypomagnesia, proceed with Magnesium sulfate 2g x 1,.  recommend Slow-Mag once daily. # Abdominal pain and back pain,  No longer relieved by tylenol. Discussed with narcotics.  Recommend fentanyl patch 28mcg Q72 h, and oxycodone 5mg  Q6 hours as needed.   # constipation, last BM was last week. Recommend Senna 2 tablets twice daily and miralax daily PRN.  Hold if diarrhea.   #Addendum, ultrasound abdomen stat was obtained and reviewed. He has fatty liver infiltration of the liver, moderate ascites, right pleural effusion. Patient is symptomatic, will proceed with diagnostic and therapeutic paracentesis to further relieve his symptoms.  Goals of care is poor. Likely metastatic disease. Patient has establish care with palliative service.  Orders Placed This Encounter  Procedures  . US Abdomen Complete    Standing Status:   Future    Standing Expiration Date:   12/04/2020    Order Specific Question:   Reason for Exam (SYMPTOM  OR DIAGNOSIS REQUIRED)    Answer:   bloating, distended abdomen    Order Specific Question:   Preferred imaging location?    Answer:   [facility]    Order Specific Question:   [place] Results- Best Contact Number?     Answer:   [phone]- pt can leave    Follow-up in 2 days with Symptom management clinic. Marland Kitchen   Earlie Server, MD, PhD Hematology Oncology [facility] Pager- [phone] 12/05/2019

## 2019-12-05 NOTE — Telephone Encounter (Signed)
Medication Josem Kaufmann was approved. Letter of approval scanned in media.

## 2019-12-05 NOTE — Progress Notes (Signed)
Nutrition Assessment   Reason for Assessment:  Weight loss, poor appetite   ASSESSMENT:  64 year old male with new gastric cardia mass, GE junction stenosis, left piriform sinus mass unsure if 2 separate primaries or metastatic disease.  Noted hospital admission on 11/23/19.  Past medical history of CAD, depression, anxiety, chronic Etoh abuse.  Planning to be seen by ENT today, Ultrasound today and PET later this week   Met with patient in infusion. RD introduced self and patient ask that RD speak with daughter Vernon Patterson.  "She is waiting in the waiting room." RD met with daughter Vernon Patterson, privately in consult room.  Daughter reports that since hospitalization she or either her sister or aunt have been staying with patient 24/7.  She says that prior to hospital admission for about 2 weeks patient was living off of ice cream.  Reports that he has never likes sweets but recently that is all he has been wanting.  Reports sips and bites after hospital admission with low urine output.  Reports after receiving fluids on Thursday in cancer center and steriods appetite picked up on Friday and Saturday.  Sunday poor po intake and sleeping most of the day.  Friday and Saturday was able to eat macaroni and cheese, plain cheesecake, ice cream, eggs.  Patient does not like ensure.  Daughter reports some issues with nausea and vomiting. Also some issues with swallowing due to mass.  Daughter reports pain in stomach and back area that effects intake.  Daughter reports constipation, 1 bowel movement weekly.     Nutrition Focused Physical Exam: not performed. Patient covered up in blankets in infusion area.    Medications: fe sulfate, mag, protonix, zofran, compazine, miralax and senna   Labs: Phosphorus 3.9, Mag 1.6, glucose 114   Anthropometrics:   Height: 68 inches Weight: 155 lb (fluid weight gain per daughter) Daughter thinks weight 139-129 lb Noted 155 lb 04/08/2015 per chart BMI: 23   Estimated Energy  Needs  Kcals: 2100-2450 calories Protein: 105-122 g Fluid: > 2 L   NUTRITION DIAGNOSIS: Inadequate oral intake related to cancer as evidenced by poor po intake taking sips and bites.     INTERVENTION:  Consider nutrition support if fits into plan of care, pending ENT evaluation, ultrasound, PET scan.  Overall intake has been minimal for at least 4 weeks likely longer due to etoh use. Discussed with Dr Tasia Catchings and concern with placement of tube and abdominal distention.  Planning ultrasound later today to evaluate.  Encouraged small frequent meals/snacks during the day served on small plates and cups.   Encouraged nausea medications and we discussed strategies to help with nausea.  Handout provided Samples of different oral nutrition supplements given to daughter along with ensure rapid hydration sample. Recommend MVI daily Contact information provided to daughter   MONITORING, EVALUATION, GOAL: weight trends, intake, poc   Next Visit: to be determined  Vernon Patterson B. Zenia Resides, [place], [place] Registered Dietitian [phone] (mobile)

## 2019-12-05 NOTE — Progress Notes (Signed)
Patient here for follow up. Pt's daughter reports that patient is not sleeping well and appetite is very poor. Pt reports having 7/10 back & stomach pain. After IVF on Friday, he felt better for 2 days and then Sunday again he started sleeping more during the day.

## 2019-12-05 NOTE — Telephone Encounter (Signed)
PA for Fentayl 67mcg/hr 72 hr patches submitted via covermymeds. Pending approval/ denial.   PA case ID: [id number] Rx: [id number]

## 2019-12-05 NOTE — Addendum Note (Signed)
Addended by: Irean Hong on: 12/05/2019 03:25 PM   Modules accepted: Orders

## 2019-12-06 ENCOUNTER — Ambulatory Visit
Admission: RE | Admit: 2019-12-06 | Discharge: 2019-12-06 | Disposition: A | Discharge status: Home / Self Care | Payer: Medicare Other | Source: Ambulatory Visit | Attending: Hospice and Palliative Medicine | Admitting: Hospice and Palliative Medicine

## 2019-12-06 DIAGNOSIS — J90 Pleural effusion, not elsewhere classified: Secondary | ICD-10-CM | POA: Insufficient documentation

## 2019-12-06 DIAGNOSIS — C160 Malignant neoplasm of cardia: Secondary | ICD-10-CM | POA: Insufficient documentation

## 2019-12-06 DIAGNOSIS — R188 Other ascites: Secondary | ICD-10-CM | POA: Insufficient documentation

## 2019-12-06 LAB — BODY FLUID CELL COUNT WITH DIFFERENTIAL
Eos, Fluid: 0 %
Lymphs, Fluid: 43 %
Monocyte-Macrophage-Serous Fluid: 26 %
Neutrophil Count, Fluid: 31 %
Other Cells, Fluid: 0 %
Total Nucleated Cell Count, Fluid: 465 cu mm

## 2019-12-06 LAB — ALBUMIN, PLEURAL OR PERITONEAL FLUID: Albumin, Fluid: 1.1 g/dL

## 2019-12-06 LAB — LACTATE DEHYDROGENASE, PLEURAL OR PERITONEAL FLUID: LD, Fluid: 49 U/L — ABNORMAL HIGH (ref 3–23)

## 2019-12-06 NOTE — Procedures (Signed)
PROCEDURE SUMMARY:  Successful image-guided paracentesis from the right lateral abdomen.  Yielded 3.8 liters of hazy yellow fluid.  No immediate complications.  EBL: zero Patient tolerated well.   Specimen was sent for labs.  Please see imaging section of Epic for full dictation.  Joaquim Nam PA-C 12/06/2019 11:17 AM

## 2019-12-07 ENCOUNTER — Other Ambulatory Visit: Payer: Self-pay

## 2019-12-07 ENCOUNTER — Encounter: Payer: Self-pay | Admitting: Oncology

## 2019-12-07 ENCOUNTER — Inpatient Hospital Stay (HOSPITAL_BASED_OUTPATIENT_CLINIC_OR_DEPARTMENT_OTHER): Payer: Medicare Other | Admitting: Oncology

## 2019-12-07 ENCOUNTER — Ambulatory Visit
Admission: RE | Admit: 2019-12-07 | Discharge: 2019-12-07 | Disposition: A | Discharge status: Home / Self Care | Payer: Medicare Other | Source: Ambulatory Visit | Attending: Oncology | Admitting: Oncology

## 2019-12-07 ENCOUNTER — Inpatient Hospital Stay: Payer: Medicare Other

## 2019-12-07 ENCOUNTER — Ambulatory Visit
Admission: RE | Admit: 2019-12-07 | Discharge: 2019-12-07 | Disposition: A | Discharge status: Home / Self Care | Payer: Medicare Other | Source: Ambulatory Visit | Attending: Radiation Oncology | Admitting: Radiation Oncology

## 2019-12-07 VITALS — BP 114/67 | HR 46 | Temp 96.5°F | Resp 18 | Wt 147.5 lb

## 2019-12-07 DIAGNOSIS — R531 Weakness: Secondary | ICD-10-CM | POA: Insufficient documentation

## 2019-12-07 DIAGNOSIS — Z7952 Long term (current) use of systemic steroids: Secondary | ICD-10-CM | POA: Diagnosis not present

## 2019-12-07 DIAGNOSIS — Z79899 Other long term (current) drug therapy: Secondary | ICD-10-CM | POA: Diagnosis not present

## 2019-12-07 DIAGNOSIS — Z87891 Personal history of nicotine dependence: Secondary | ICD-10-CM | POA: Insufficient documentation

## 2019-12-07 DIAGNOSIS — F418 Other specified anxiety disorders: Secondary | ICD-10-CM | POA: Insufficient documentation

## 2019-12-07 DIAGNOSIS — R112 Nausea with vomiting, unspecified: Secondary | ICD-10-CM

## 2019-12-07 DIAGNOSIS — I2510 Atherosclerotic heart disease of native coronary artery without angina pectoris: Secondary | ICD-10-CM | POA: Insufficient documentation

## 2019-12-07 DIAGNOSIS — D490 Neoplasm of unspecified behavior of digestive system: Secondary | ICD-10-CM

## 2019-12-07 DIAGNOSIS — C160 Malignant neoplasm of cardia: Secondary | ICD-10-CM

## 2019-12-07 DIAGNOSIS — K921 Melena: Secondary | ICD-10-CM | POA: Diagnosis not present

## 2019-12-07 DIAGNOSIS — I10 Essential (primary) hypertension: Secondary | ICD-10-CM | POA: Diagnosis not present

## 2019-12-07 DIAGNOSIS — R634 Abnormal weight loss: Secondary | ICD-10-CM | POA: Diagnosis not present

## 2019-12-07 DIAGNOSIS — R110 Nausea: Secondary | ICD-10-CM

## 2019-12-07 LAB — COMPREHENSIVE METABOLIC PANEL
ALT: 18 U/L (ref 0–44)
AST: 30 U/L (ref 15–41)
Albumin: 2.6 g/dL — ABNORMAL LOW (ref 3.5–5.0)
Alkaline Phosphatase: 57 U/L (ref 38–126)
Anion gap: 5 (ref 5–15)
BUN: 14 mg/dL (ref 8–23)
CO2: 24 mmol/L (ref 22–32)
Calcium: 8.1 mg/dL — ABNORMAL LOW (ref 8.9–10.3)
Chloride: 105 mmol/L (ref 98–111)
Creatinine, Ser: 0.65 mg/dL (ref 0.61–1.24)
GFR calc Af Amer: >60 mL/min (ref >60)
GFR calc non Af Amer: >60 mL/min (ref >60)
Glucose, Bld: 88 mg/dL (ref 70–99)
Potassium: 3.4 mmol/L — ABNORMAL LOW (ref 3.5–5.1)
Sodium: 134 mmol/L — ABNORMAL LOW (ref 135–145)
Total Bilirubin: 0.7 mg/dL (ref 0.3–1.2)
Total Protein: 5.6 g/dL — ABNORMAL LOW (ref 6.5–8.1)

## 2019-12-07 LAB — CBC WITH DIFFERENTIAL/PLATELET
Abs Immature Granulocytes: 0.02 10*3/uL (ref 0.00–0.07)
Basophils Absolute: 0 10*3/uL (ref 0.0–0.1)
Basophils Relative: 0 %
Eosinophils Absolute: 0.1 10*3/uL (ref 0.0–0.5)
Eosinophils Relative: 1 %
HCT: 30.9 % — ABNORMAL LOW (ref 39.0–52.0)
Hemoglobin: 10.4 g/dL — ABNORMAL LOW (ref 13.0–17.0)
Immature Granulocytes: 0 %
Lymphocytes Relative: 16 %
Lymphs Abs: 1.2 10*3/uL (ref 0.7–4.0)
MCH: 31.6 pg (ref 26.0–34.0)
MCHC: 33.7 g/dL (ref 30.0–36.0)
MCV: 93.9 fL (ref 80.0–100.0)
Monocytes Absolute: 0.7 10*3/uL (ref 0.1–1.0)
Monocytes Relative: 10 %
Neutro Abs: 5.6 10*3/uL (ref 1.7–7.7)
Neutrophils Relative %: 73 %
Platelets: 234 10*3/uL (ref 150–400)
RBC: 3.29 MIL/uL — ABNORMAL LOW (ref 4.22–5.81)
RDW: 15.8 % — ABNORMAL HIGH (ref 11.5–15.5)
WBC: 7.6 10*3/uL (ref 4.0–10.5)
nRBC: 0 % (ref 0.0–0.2)

## 2019-12-07 LAB — MAGNESIUM: Magnesium: 1.8 mg/dL (ref 1.7–2.4)

## 2019-12-07 LAB — GLUCOSE, CAPILLARY: Glucose-Capillary: 78 mg/dL (ref 70–99)

## 2019-12-07 LAB — CYTOLOGY - NON PAP

## 2019-12-07 MED ORDER — DEXAMETHASONE 4 MG PO TABS: 4.0000 mg | ORAL_TABLET | Freq: Two times a day (BID) | ORAL | 0 refills | Status: AC

## 2019-12-07 MED ORDER — SODIUM CHLORIDE 0.9 % IV SOLN
Freq: Once | INTRAVENOUS | Status: AC
  Filled 2019-12-07: qty 250

## 2019-12-07 MED ORDER — ONDANSETRON HCL 4 MG/2ML IJ SOLN
INTRAMUSCULAR | Status: AC
  Filled 2019-12-07: qty 4

## 2019-12-07 MED ORDER — SENNA 8.8 MG/5ML PO SYRP: 5.0000 mL | ORAL_SOLUTION | Freq: Two times a day (BID) | ORAL | 0 refills | Status: AC

## 2019-12-07 MED ORDER — DEXAMETHASONE SODIUM PHOSPHATE 10 MG/ML IJ SOLN
10.0000 mg | Freq: Once | INTRAMUSCULAR | Status: AC
  Administered 2019-12-07: 10 mg via INTRAVENOUS

## 2019-12-07 MED ORDER — DEXAMETHASONE SODIUM PHOSPHATE 10 MG/ML IJ SOLN
INTRAMUSCULAR | Status: AC
  Filled 2019-12-07: qty 1

## 2019-12-07 MED ORDER — ONDANSETRON HCL 4 MG/2ML IJ SOLN
8.0000 mg | Freq: Once | INTRAMUSCULAR | Status: AC
  Administered 2019-12-07: 8 mg via INTRAVENOUS

## 2019-12-07 MED ORDER — FLUDEOXYGLUCOSE F - 18 (FDG) INJECTION
8.0000 | Freq: Once | INTRAVENOUS | Status: AC | PRN
  Administered 2019-12-07: 8.216 via INTRAVENOUS

## 2019-12-07 NOTE — Progress Notes (Signed)
Pt here to see symptom management. Pt reports that he has not had a bowel movement in a few days despide taking senna times a day . Daughter, Miquel Dunn, reports that patient seems to be doing better, but seem to be slightly more confused.

## 2019-12-07 NOTE — Consult Note (Signed)
.  NEW PATIENT EVALUATION  Name: Vernon Patterson  MRN: [id number]  Date:   12/07/2019     DOB: 12/29/1955   This 64 y.o. 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  REFERRING PHYSICIAN: Albina Billet, MD  CHIEF COMPLAINT:  Chief Complaint  Patient presents with  . 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   PREVIOUS INVESTIGATIONS:  PET CT scan pending performed today Pathology report reviewed Clinical notes reviewed  HPI: Patient is a 64 year old male with multiple medical comorbidities including depression anxiety coronary artery disease.  He was recently mid to the hospital with profound weakness weight loss dark stools nausea and vomiting.  He underwent upper endoscopy showing a gastric cardiac mass at the GE junction with stenosis.  Biopsy at that time was positive for poorly differentiated squamous cell carcinoma.  His hemoglobin was 7.4.  He was also noted to have a lesion in the left piriform sinus which has been reviewed by ENT and thought to be benign.  Patient is poor performance status is undergoing a PET CT scan today which we will review.  Patient is seen today is doing fair he has received some fluids today he did have some paracentesis yesterday cytology is pending.  He states his swallowing is poor.  PLANNED TREATMENT REGIMEN: Depending on PET scan may be palliative radiation therapy or with curative intent with concurrent chemotherapy  PAST MEDICAL HISTORY:  has a past medical history of Anxiety, Coronary artery disease, Depression, Heart murmur, Hypertension, and Nausea and vomiting (12/01/2019).    PAST SURGICAL HISTORY:  Past Surgical History:  Procedure Laterality Date  . CARDIAC CATHETERIZATION    . CORONARY ARTERY BYPASS GRAFT    . CORONARY ARTERY BYPASS GRAFT N/A 04/18/2013    Procedure: CORONARY ARTERY BYPASS GRAFTING (CABG);  Surgeon: Gaye Pollack, MD;  Location: [facility];  Service: Open Heart Surgery;  Laterality: N/A;  Times  3  using left internal mammary artery and endoscopically harvested right saphenous vein  . ENDOVEIN HARVEST OF GREATER SAPHENOUS VEIN Right 04/18/2013   Procedure: ENDOVEIN HARVEST OF GREATER SAPHENOUS VEIN;  Surgeon: Gaye Pollack, MD;  Location: [facility];  Service: Open Heart Surgery;  Laterality: Right;  Some of the saphenous vein from the lower leg also used.  . ESOPHAGOGASTRODUODENOSCOPY (EGD) WITH PROPOFOL N/A 11/26/2019   Procedure: ESOPHAGOGASTRODUODENOSCOPY (EGD) WITH PROPOFOL;  Surgeon: Lin Landsman, MD;  Location: [facility];  Service: Gastroenterology;  Laterality: N/A;  . INTRAOPERATIVE TRANSESOPHAGEAL ECHOCARDIOGRAM N/A 04/18/2013   Procedure: INTRAOPERATIVE TRANSESOPHAGEAL ECHOCARDIOGRAM;  Surgeon: Gaye Pollack, MD;  Location: [facility];  Service: Open Heart Surgery;  Laterality: N/A;    FAMILY HISTORY: family history includes Diabetes in his mother.  SOCIAL HISTORY:  reports that he quit smoking 13 days ago. His smoking use included cigars. He has never used smokeless tobacco. He reports current alcohol use. He reports that he does not use drugs.  ALLERGIES: Patient has no known allergies.  MEDICATIONS:  Current Outpatient Medications  Medication Sig Dispense Refill  . acetaminophen (TYLENOL) 325 MG tablet Take 650 mg by mouth every 6 (six) hours as needed.    Marland Kitchen dexamethasone (DECADRON) 4 MG tablet Take 1 tablet (4 mg total) by mouth 2 (two) times daily. 14 tablet 0  . fentaNYL (DURAGESIC) 12 MCG/HR Place 1 patch onto  the skin every 3 (three) days. 5 patch 0  . ferrous sulfate 325 (65 FE) MG tablet Take 1 tablet (325 mg total) by mouth daily with breakfast. (Patient not taking: Reported on 12/07/2019) 30 tablet 0  . magnesium chloride (SLOW-MAG) 64 MG TBEC SR tablet Take 1 tablet (64 mg total) by mouth daily. 30 tablet 0   . metoprolol tartrate (LOPRESSOR) 25 MG tablet Take 25 mg by mouth daily.    . ondansetron (ZOFRAN ODT) 8 MG disintegrating tablet Take 1 tablet (8 mg total) by mouth every 6 (six) hours as needed for nausea or vomiting. 60 tablet 1  . oxyCODONE (OXY IR/ROXICODONE) 5 MG immediate release tablet Take 1 tablet (5 mg total) by mouth every 6 (six) hours as needed for severe pain. 30 tablet 0  . pantoprazole (PROTONIX) 40 MG tablet Take 1 tablet (40 mg total) by mouth 2 (two) times daily. 60 tablet 0  . polyethylene glycol (MIRALAX) 17 g packet Take 17 g by mouth daily as needed. 14 each 0  . prochlorperazine (COMPAZINE) 10 MG tablet Take 1 tablet (10 mg total) by mouth every 6 (six) hours as needed for nausea or vomiting. 60 tablet 0  . senna (SENOKOT) 8.6 MG TABS tablet Take 2 tablets (17.2 mg total) by mouth in the morning and at bedtime. 120 tablet 0  . Sennosides (SENNA) 8.8 MG/5ML SYRP Take 5-10 mLs (8.8-17.6 mg total) by mouth 2 (two) times daily. 105 mL 0  . traZODone (DESYREL) 50 MG tablet Take 50 mg by mouth at bedtime.     No current facility-administered medications for this encounter.   Facility-Administered Medications Ordered in Other Encounters  Medication Dose Route Frequency Provider Last Rate Last Admin  . sodium chloride flush (NS) 0.9 % injection 3 mL  3 mL Intravenous Q12H Visser, Jacquelyn D, PA-C        ECOG PERFORMANCE STATUS:  2 - Symptomatic, <50% confined to bed  REVIEW OF SYSTEMS: Patient denies any weight loss, fatigue, weakness, fever, chills or night sweats. Patient denies any loss of vision, blurred vision. Patient denies any ringing  of the ears or hearing loss. No irregular heartbeat. Patient denies heart murmur or history of fainting. Patient denies any chest pain or pain radiating to her upper extremities. Patient denies any shortness of breath, difficulty breathing at night, cough or hemoptysis. Patient denies any swelling in the lower legs. Patient denies any  nausea vomiting, vomiting of blood, or coffee ground material in the vomitus. Patient denies any stomach pain. Patient states has had normal bowel movements no significant constipation or diarrhea. Patient denies any dysuria, hematuria or significant nocturia. Patient denies any problems walking, swelling in the joints or loss of balance. Patient denies any skin changes, loss of hair or loss of weight. Patient denies any excessive worrying or anxiety or significant depression. Patient denies any problems with insomnia. Patient denies excessive thirst, polyuria, polydipsia. Patient denies any swollen glands, patient denies easy bruising or easy bleeding. Patient denies any recent infections, allergies or URI. Patient "s visual fields have not changed significantly in recent time.   PHYSICAL EXAM: There were no vitals taken for this visit. Frail-appearing thin cachectic male in NAD.  Well-developed well-nourished patient in NAD. HEENT reveals PERLA, EOMI, discs not visualized.  Oral cavity is clear. No oral mucosal lesions are identified. Neck is clear without evidence of cervical or supraclavicular adenopathy. Lungs are clear to A&P. Cardiac examination is essentially unremarkable with regular rate and rhythm without  murmur rub or thrill. Abdomen is benign with no organomegaly or masses noted. Motor sensory and DTR levels are equal and symmetric in the upper and lower extremities. Cranial nerves II through XII are grossly intact. Proprioception is intact. No peripheral adenopathy or edema is identified. No motor or sensory levels are noted. Crude visual fields are within normal range.  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   Either palliative or curative radiation therapy with concurrent chemotherapy will be offered.  I anticipate a 5-week course of radiation therapy risks and benefits of those treatments including possible increased dysphagia fatigue alteration of blood count skin reaction all were discussed in detail with the patient and his daughter.  Both seem to comprehend my treatment plan well I have tentatively set up a CT simulation for next week.  Patient knows to [facility] with any concerns prior.  We will coordinate chemotherapy with medical oncology.  There will be extra effort by both professional staff as well as technical staff to coordinate and manage concurrent chemoradiation and ensuing side effects during his treatments.   I would like to take this opportunity to thank you for allowing me to participate in the care of your patient.Noreene Filbert, MD

## 2019-12-07 NOTE — Patient Instructions (Signed)
Hold off on Magnesium for now  Hold Iron for now.   I sent RX for Senna liquid which you may can get OTC.   For your appetite we can try decadron 4 mg twice per day. RX sent to your pharmacy.   Can take Colace with senna for bowel movements.   Labs today looked great.

## 2019-12-07 NOTE — Progress Notes (Signed)
Symptom Management Consult note [facility]  Telephone:([phone] [phone]  Patient Care Team: Albina Billet, MD as PCP - General (Internal Medicine) Dionisio David, MD (Cardiology) Clent Jacks, RN as Oncology Nurse Navigator Earlie Server, MD as Consulting Physician (Oncology)   Name of the patient: Vernon Patterson  [id number]  January 17, 1956   Date of visit: 12/07/2019   Diagnosis- Lung Cancer   Chief complaint/ Reason for visit-dehydration  Heme/Onc history: Vernon Patterson is a 64 year old male with multiple medical problems including CAD, severe aortic stenosis, depression, anxiety who was recently hospitalized from 55 21-7 1921 with GI bleed.  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  Additionally patient was found to have a left piriform sinus mass which could reflect metastatic disease for second primary.  Patient was found to have severe aortic stenosis but is not felt to be amenable to treatment at the present time given his ongoing issues with cancer.  He has had significant difficulty with eating and drinking with progressive weight loss.  He has been referred to radiation oncology and will meet with Dr. Donella Stade today.  Interval history-In the interim, patient had a ultrasound of his abdomen for abdominal distention which showed moderate ascites and a right pleural effusion.  Had paracentesis which removed 3.8 L of peritoneal fluid.  He is scheduled for a PET scan today.  Per Dr. Collie Siad note, if no distant metastasis will proceed with concurrent chemoradiation.  If evidence of distant metastasis Dr. Tasia Catchings would recommend palliative radiation due to stenosis.  He was referred to palliative care and our oncology dietitian.  He met with Altha Harm, NP for palliative medicine on 12/05/2019 to discuss pain control and bowel prophylaxis.  He was started on  oxycodone and started on Senokot.  He was referred to home-based palliative care services.  Today, he presents to symptom management for concerns of malnutrition and dehydration secondary to severe abdominal pain and minimal oral intake.  He met with the dietitian on 12/05/2019 who recommends a PEG tube if caloric and nutritional intake cannot be increased.  He presents with his daughter.  She states he has been eating better since he has been receiving  IV fluids and IV dexamethasone.  Appetite has improved some.  She has been feeding him more frequent snacks that are high in calories.  Notes problems swallowing some of his pills.  Has chronic constipation and is continuing to take Senokot.  Cannot drink MiraLAX. Feels overall poor.    ECOG FS:2 - Symptomatic, <50% confined to bed  Review of systems- Review of Systems  Constitutional: Positive for malaise/fatigue and weight loss.  HENT:       Dysphagia  Gastrointestinal: Positive for abdominal pain.  Neurological: Positive for weakness.     Current treatment-we will begin radiation in a couple weeks.  No Known Allergies   Past Medical History:  Diagnosis Date  . Anxiety   . Coronary artery disease   . Depression   . Heart murmur   . Hypertension   . Nausea and vomiting 12/01/2019     Past Surgical History:  Procedure Laterality Date  . CARDIAC CATHETERIZATION    . CORONARY ARTERY BYPASS GRAFT    . CORONARY ARTERY BYPASS GRAFT N/A 04/18/2013   Procedure: CORONARY ARTERY BYPASS GRAFTING (CABG);  Surgeon: Gaye Pollack, MD;  Location: [place];  Service: Open Heart Surgery;  Laterality: N/A;  Times  3  using left internal mammary artery and endoscopically harvested right saphenous vein  . ENDOVEIN HARVEST OF GREATER SAPHENOUS VEIN Right 04/18/2013   Procedure: ENDOVEIN HARVEST OF GREATER SAPHENOUS VEIN;  Surgeon: Gaye Pollack, MD;  Location: [place];  Service: Open Heart Surgery;  Laterality: Right;  Some of the saphenous vein from the  lower leg also used.  . ESOPHAGOGASTRODUODENOSCOPY (EGD) WITH PROPOFOL N/A 11/26/2019   Procedure: ESOPHAGOGASTRODUODENOSCOPY (EGD) WITH PROPOFOL;  Surgeon: Lin Landsman, MD;  Location: [place];  Service: Gastroenterology;  Laterality: N/A;  . INTRAOPERATIVE TRANSESOPHAGEAL ECHOCARDIOGRAM N/A 04/18/2013   Procedure: INTRAOPERATIVE TRANSESOPHAGEAL ECHOCARDIOGRAM;  Surgeon: Gaye Pollack, MD;  Location: [place] [facility];  Service: Open Heart Surgery;  Laterality: N/A;    Social History   Socioeconomic History  . Marital status: Married    Spouse name: Not on file  . Number of children: Not on file  . Years of education: Not on file  . Highest education level: Not on file  Occupational History  . Occupation: not employed     Fish farm manager: [employer]  Tobacco Use  . Smoking status: Former Smoker    Types: Cigars    Quit date: 11/24/2019    Years since quitting: 0.0  . Smokeless tobacco: Never Used  . Tobacco comment: 1-2 cigars/day  Vaping Use  . Vaping Use: Never used  Substance and Sexual Activity  . Alcohol use: Yes    Comment: 5 bottles of wine/week/ Quit drinking about 5 months   . Drug use: No  . Sexual activity: Never  Other Topics Concern  . Not on file  Social History Narrative  . Not on file   Social Determinants of Health   Financial Resource Strain:   . Difficulty of Paying Living Expenses:   Food Insecurity:   . Worried About Charity fundraiser in the Last Year:   . Arboriculturist in the Last Year:   Transportation Needs:   . Film/video editor (Medical):   Marland Kitchen Lack of Transportation (Non-Medical):   Physical Activity:   . Days of Exercise per Week:   . Minutes of Exercise per Session:   Stress:   . Feeling of Stress :   Social Connections:   . Frequency of Communication with Friends and Family:   . Frequency of Social Gatherings with Friends and Family:   . Attends Religious Services:   . Active Member of Clubs or Organizations:   . Attends English as a second language teacher Meetings:   Marland Kitchen Marital Status:   Intimate Partner Violence:   . Fear of Current or Ex-Partner:   . Emotionally Abused:   Marland Kitchen Physically Abused:   . Sexually Abused:     Family History  Problem Relation Age of Onset  . Diabetes Mother      Current Outpatient Medications:  .  acetaminophen (TYLENOL) 325 MG tablet, Take 650 mg by mouth every 6 (six) hours as needed., Disp: , Rfl:  .  fentaNYL (DURAGESIC) 12 MCG/HR, Place 1 patch onto the skin every 3 (three) days., Disp: 5 patch, Rfl: 0 .  magnesium chloride (SLOW-MAG) 64 MG TBEC SR tablet, Take 1 tablet (64 mg total) by mouth daily., Disp: 30 tablet, Rfl: 0 .  metoprolol tartrate (LOPRESSOR) 25 MG tablet, Take 25 mg by mouth daily., Disp: , Rfl:  .  ondansetron (ZOFRAN ODT) 8 MG disintegrating tablet, Take 1 tablet (8 mg total) by mouth  every 6 (six) hours as needed for nausea or vomiting., Disp: 60 tablet, Rfl: 1 .  oxyCODONE (OXY IR/ROXICODONE) 5 MG immediate release tablet, Take 1 tablet (5 mg total) by mouth every 6 (six) hours as needed for severe pain., Disp: 30 tablet, Rfl: 0 .  pantoprazole (PROTONIX) 40 MG tablet, Take 1 tablet (40 mg total) by mouth 2 (two) times daily., Disp: 60 tablet, Rfl: 0 .  polyethylene glycol (MIRALAX) 17 g packet, Take 17 g by mouth daily as needed., Disp: 14 each, Rfl: 0 .  prochlorperazine (COMPAZINE) 10 MG tablet, Take 1 tablet (10 mg total) by mouth every 6 (six) hours as needed for nausea or vomiting., Disp: 60 tablet, Rfl: 0 .  senna (SENOKOT) 8.6 MG TABS tablet, Take 2 tablets (17.2 mg total) by mouth in the morning and at bedtime., Disp: 120 tablet, Rfl: 0 .  traZODone (DESYREL) 50 MG tablet, Take 50 mg by mouth at bedtime., Disp: , Rfl:  .  dexamethasone (DECADRON) 4 MG tablet, Take 1 tablet (4 mg total) by mouth 2 (two) times daily., Disp: 14 tablet, Rfl: 0 .  ferrous sulfate 325 (65 FE) MG tablet, Take 1 tablet (325 mg total) by mouth daily with breakfast. (Patient not taking:  Reported on 12/09/2019), Disp: 30 tablet, Rfl: 0 .  Sennosides (SENNA) 8.8 MG/5ML SYRP, Take 5-10 mLs (8.8-17.6 mg total) by mouth 2 (two) times daily., Disp: 105 mL, Rfl: 0 No current facility-administered medications for this visit.  Facility-Administered Medications Ordered in Other Visits:  .  sodium chloride flush (NS) 0.9 % injection 3 mL, 3 mL, Intravenous, Q12H, Arvil Chaco, PA-C  Physical exam:  Vitals:   12/07/19 0950  BP: 114/67  Pulse: 46  Resp: 18  Temp: (!) 96.5 F (35.8 C)  Weight: 147 lb 8 oz (66.9 kg)   Physical Exam Constitutional:      Appearance: Normal appearance.  HENT:     Head: Normocephalic and atraumatic.  Eyes:     Pupils: Pupils are equal, round, and reactive to light.  Cardiovascular:     Rate and Rhythm: Normal rate and regular rhythm.     Heart sounds: Normal heart sounds. No murmur heard.   Pulmonary:     Effort: Pulmonary effort is normal.     Breath sounds: Normal breath sounds. No wheezing.  Abdominal:     General: Bowel sounds are normal. There is no distension.     Palpations: Abdomen is soft.     Tenderness: There is no abdominal tenderness.  Musculoskeletal:        General: Normal range of motion.     Cervical back: Normal range of motion.  Skin:    General: Skin is warm and dry.     Findings: No rash.  Neurological:     Mental Status: He is alert and oriented to person, place, and time.  Psychiatric:        Judgment: Judgment normal.      CMP Latest Ref Rng & Units 12/09/2019  Glucose 70 - 99 mg/dL 86  BUN 8 - 23 mg/dL 12  Creatinine 0.61 - 1.24 mg/dL 0.78  Sodium 135 - 145 mmol/L 135  Potassium 3.5 - 5.1 mmol/L 4.2  Chloride 98 - 111 mmol/L 103  CO2 22 - 32 mmol/L 25  Calcium 8.9 - 10.3 mg/dL 8.3(L)  Total Protein 6.5 - 8.1 g/dL 5.9(L)  Total Bilirubin 0.3 - 1.2 mg/dL 1.2  Alkaline Phos 38 - 126 U/L 62  AST  15 - 41 U/L 50(H)  ALT 0 - 44 U/L 24   CBC Latest Ref Rng & Units 12/09/2019  WBC 4.0 - 10.5 K/uL 7.3    Hemoglobin 13.0 - 17.0 g/dL 10.9(L)  Hematocrit 39 - 52 % 32.4(L)  Platelets 150 - 400 K/uL 230    No images are attached to the encounter.  DG Chest 2 View  Result Date: 11/26/2019 CLINICAL DATA:  Cough and weakness EXAM: CHEST - 2 VIEW COMPARISON:  Chest radiograph 10/19/2019 FINDINGS: Stable cardiomediastinal contours status post median sternotomy. Minimal atelectasis at the right lung base. No focal consolidation. On lateral view there is a 9 mm nodule inferiorly which is not well seen on the frontal projection. No pneumothorax or pleural effusion. No acute finding in the visualized skeleton. IMPRESSION: 1. No evidence of active disease in the chest. 2. 9 mm nodule inferiorly on the lateral view which is not well seen on the frontal projection. Consider nonemergent chest CT for further evaluation. Electronically Signed   By: Audie Pinto M.D.   On: 11/26/2019 17:12   US Abdomen Complete  Result Date: 12/05/2019 CLINICAL DATA:  Distended abdomen EXAM: ABDOMEN ULTRASOUND COMPLETE COMPARISON:  Ultrasound abdomen 10/19/2019 FINDINGS: Gallbladder: No gallstones or wall thickening visualized. No sonographic Murphy sign noted by sonographer. Common bile duct: Diameter: 3.8 mm Liver: Diffusely increased echogenicity liver parenchyma seen previously. No focal lesion. Portal vein is patent on color Doppler imaging with normal direction of blood flow towards the liver. IVC: No abnormality visualized. Pancreas: Visualized portion unremarkable. Spleen: Size and appearance within normal limits. Right Kidney: Length: 10.6 cm. Echogenicity within normal limits. No mass or hydronephrosis visualized. Left Kidney: Length: 10.1 cm. Echogenicity within normal limits. No mass or hydronephrosis visualized. Abdominal aorta: No aneurysm visualized. Other findings: Moderate ascites.  Right pleural effusion. IMPRESSION: Echogenic liver suggesting fatty infiltration as noted previously Negative for gallstones Moderate  ascites.  Right pleural effusion. Electronically Signed   By: Franchot Gallo M.D.   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 EXAM: NUCLEAR MEDICINE PET SKULL BASE TO THIGH TECHNIQUE: 8.21 mCi F-18 FDG was injected intravenously. Full-ring PET imaging was performed from the skull base to thigh after the radiotracer. CT data was obtained and used for attenuation correction and anatomic localization. Fasting blood glucose: 78 mg/dl COMPARISON:  None. FINDINGS: Mediastinal blood pool activity: SUV max 2.10 Liver activity: SUV max NA NECK: No hypermetabolic lymph nodes in the neck. Incidental CT findings: Extensive age advanced carotid artery calcifications. CHEST: 12 mm left paraesophageal lymph node on image 149/7 is hypermetabolic with SUV max of 0.26 and consistent with nodal metastasis. Moderate left and small right pleural effusions are noted. No hypermetabolic pleural nodules to suggest pleural metastatic disease. No worrisome pulmonary nodules on the lung windows to suggest pulmonary metastatic disease. No other hypermetabolic mediastinal or hilar lymph nodes. Incidental CT findings: Age advanced vascular calcifications and surgical changes from prior gastric bypass surgery. Extensive three-vessel coronary artery calcifications. ABDOMEN/PELVIS: There is a large irregular mass involving the gastric cardia just below the GE junction measuring approximately 5.0 x 4.8 cm. There appears to be direct extension into the surrounding soft tissues with a large nodal mass in the gastrohepatic ligament. Right-sided hepatic duodenum ligament adenopathy the measuring 2 cm with SUV max of 9.01. Mesenteric nodal mass adjacent to the SMA measures 3.5 cm on image 156/3.  SUV max is 11.56. Right-sided retroperitoneal nodal mass measures 2.5 cm on image 165/3. SUV max is 11.86. No findings for hepatic  metastatic disease. Moderate abdominal/pelvic ascites but no obvious omental or peritoneal surface lesions. Incidental CT findings: Significant age advanced vascular disease. SKELETON: No focal hypermetabolic activity to suggest skeletal metastasis. Incidental CT findings: none IMPRESSION: 1. 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 It appears to be directly infiltrating the surrounding soft tissues and there is bulky gastrohepatic ligament, paraduodenal, retroperitoneal and mesenteric adenopathy. There is also upward lymphatic disease with an enlarged and hypermetabolic mid level esophageal lymph node. 2. No findings for pulmonary metastatic disease or hepatic metastatic disease. 3. Bilateral pleural effusions and moderate volume abdominal/pelvic ascites. Electronically Signed   By: Marijo Sanes M.D.   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 EXAM: ULTRASOUND GUIDED PARACENTESIS MEDICATIONS: None. COMPLICATIONS: None immediate. PROCEDURE: Informed written consent was obtained from the patient after a discussion of the risks, benefits and alternatives to treatment. A timeout was performed prior to the initiation of the procedure. Initial ultrasound was performed to localize ascites. The right lower abdomen was prepped and draped in the usual sterile fashion. 1% lidocaine was used for local anesthesia. Following this, a 6 Fr Safe-T-Centesis catheter was introduced. An ultrasound image was saved for documentation purposes. The paracentesis was performed. The catheter was removed and a dressing was applied. The patient tolerated the procedure well without immediate post procedural complication. FINDINGS: A total of approximately 2.2 L of cloudy, yellow fluid was removed. Samples were sent to the laboratory as requested by the clinical team. IMPRESSION: Successful ultrasound-guided paracentesis yielding 2.2  liters of peritoneal fluid. Electronically Signed   By: Aletta Edouard M.D.   On: 12/09/2019 16:34   US Paracentesis  Result Date: 12/06/2019 INDICATION: Patient with gastric cardia mass likely malignancy, new onset abdominal distension. Request to IR for diagnostic and therapeutic paracentesis. EXAM: ULTRASOUND GUIDED DIAGNOSTIC AND THERAPEUTIC PARACENTESIS MEDICATIONS: 10 mL 1% lidocaine COMPLICATIONS: None immediate. PROCEDURE: Informed written consent was obtained from the patient after a discussion of the risks, benefits and alternatives to treatment. A timeout was performed prior to the initiation of the procedure. Initial ultrasound scanning demonstrates a moderate amount of ascites within the right lower abdominal quadrant. The right lower abdomen was prepped and draped in the usual sterile fashion. 1% lidocaine was used for local anesthesia. Following this, a 6 Fr Safe-T-Centesis catheter was introduced. An ultrasound image was saved for documentation purposes. The paracentesis was performed. The catheter was removed and a dressing was applied. The patient tolerated the procedure well without immediate post procedural complication. FINDINGS: A total of approximately 3.8 L of hazy yellow fluid was removed. Samples were sent to the laboratory as requested by the clinical team. IMPRESSION: Successful ultrasound-guided paracentesis yielding 3.8 liters of peritoneal fluid. Read by Candiss Norse, PA-C Electronically Signed   By: Sandi Mariscal M.D.   On: 12/06/2019 12:31   ECHOCARDIOGRAM COMPLETE  Result Date: 11/25/2019    ECHOCARDIOGRAM REPORT   Patient Name:   Vernon Patterson Date of Exam: 11/25/2019 Medical Rec #:  [id number]      Height:       68.0 in Accession #:    [id number]     Weight:       129.9 lb Date of Birth:  1955/08/28      BSA:  1.701 m Patient Age:    64 years       BP:           143/81 mmHg Patient Gender: M              HR:           58 bpm. Exam Location:  [facility] Procedure: 2D  Echo, Color Doppler and Cardiac Doppler Indications:     R01.2 Abnormal Heart Sounds Nec  History:         Patient has no prior history of Echocardiogram examinations.                  CAD; Prior CABG.  Sonographer:     Charmayne Sheer RDCS (AE) Referring Phys:  [id number] Arvil Chaco Diagnosing Phys: Ida Rogue MD IMPRESSIONS  1. Left ventricular ejection fraction, by estimation, is 60 to 65%. The left ventricle has normal function. The left ventricle has no regional wall motion abnormalities. Left ventricular diastolic parameters are consistent with Grade II diastolic dysfunction (pseudonormalization).  2. Right ventricular systolic function is normal. The right ventricular size is normal. There is mildly elevated pulmonary artery systolic pressure.  3. Left atrial size was mildly dilated.  4. The aortic valve is heavily calcified. Aortic valve regurgitation is mild. Severe aortic valve stenosis. Aortic valve area, by VTI measures 0.86 cm. Aortic valve mean gradient measures 38.0 mmHg. Aortic valve Vmax measures 4.02 m/s. FINDINGS  Left Ventricle: Left ventricular ejection fraction, by estimation, is 60 to 65%. The left ventricle has normal function. The left ventricle has no regional wall motion abnormalities. The left ventricular internal cavity size was normal in size. There is  no left ventricular hypertrophy. Left ventricular diastolic parameters are consistent with Grade II diastolic dysfunction (pseudonormalization). Right Ventricle: The right ventricular size is normal. No increase in right ventricular wall thickness. Right ventricular systolic function is normal. There is mildly elevated pulmonary artery systolic pressure. The tricuspid regurgitant velocity is 2.53  m/s, and with an assumed right atrial pressure of 10 mmHg, the estimated right ventricular systolic pressure is 13.0 mmHg. Left Atrium: Left atrial size was mildly dilated. Right Atrium: Right atrial size was normal in size. Pericardium:  There is no evidence of pericardial effusion. Mitral Valve: The mitral valve is normal in structure. There is mild thickening of the mitral valve leaflet(s). Normal mobility of the mitral valve leaflets. Mild mitral valve regurgitation. No evidence of mitral valve stenosis. MV peak gradient, 4.2 mmHg. The mean mitral valve gradient is 2.0 mmHg. Tricuspid Valve: The tricuspid valve is normal in structure. Tricuspid valve regurgitation is not demonstrated. No evidence of tricuspid stenosis. Aortic Valve: The aortic valve is normal in structure. Aortic valve regurgitation is mild. Severe aortic stenosis is present. Aortic valve mean gradient measures 38.0 mmHg. Aortic valve peak gradient measures 64.6 mmHg. Aortic valve area, by VTI measures  0.86 cm. Pulmonic Valve: The pulmonic valve was normal in structure. Pulmonic valve regurgitation is not visualized. No evidence of pulmonic stenosis. Aorta: The aortic root is normal in size and structure. Venous: The inferior vena cava is normal in size with greater than 50% respiratory variability, suggesting right atrial pressure of 3 mmHg. IAS/Shunts: No atrial level shunt detected by color flow Doppler.  LEFT VENTRICLE PLAX 2D LVIDd:         3.94 cm  Diastology LVIDs:         2.13 cm  LV e' lateral:   9.90 cm/s LV PW:  1.26 cm  LV E/e' lateral: 10.4 LV IVS:        1.13 cm  LV e' medial:    5.22 cm/s LVOT diam:     2.40 cm  LV E/e' medial:  19.7 LV SV:         89 LV SV Index:   52 LVOT Area:     4.52 cm  RIGHT VENTRICLE RV Basal diam:  3.15 cm LEFT ATRIUM             Index       RIGHT ATRIUM           Index LA diam:        4.20 cm 2.47 cm/m  RA Area:     12.70 cm LA Vol (A2C):   62.5 ml 36.74 ml/m RA Volume:   27.80 ml  16.34 ml/m LA Vol (A4C):   48.2 ml 28.33 ml/m LA Biplane Vol: 55.2 ml 32.45 ml/m  AORTIC VALVE                    PULMONIC VALVE AV Area (Vmax):    0.82 cm     PV Vmax:       2.01 m/s AV Area (Vmean):   0.95 cm     PV Vmean:      135.000 cm/s  AV Area (VTI):     0.86 cm     PV VTI:        0.470 m AV Vmax:           402.00 cm/s  PV Peak grad:  16.2 mmHg AV Vmean:          247.500 cm/s PV Mean grad:  9.0 mmHg AV VTI:            1.040 m AV Peak Grad:      64.6 mmHg AV Mean Grad:      38.0 mmHg LVOT Vmax:         72.50 cm/s LVOT Vmean:        51.900 cm/s LVOT VTI:          0.197 m LVOT/AV VTI ratio: 0.19  AORTA Ao Root diam: 3.80 cm MITRAL VALVE                TRICUSPID VALVE MV Area (PHT): 4.04 cm     TR Peak grad:   25.6 mmHg MV Peak grad:  4.2 mmHg     TR Vmax:        253.00 cm/s MV Mean grad:  2.0 mmHg MV Vmax:       1.02 m/s     SHUNTS MV Vmean:      58.1 cm/s    Systemic VTI:  0.20 m MV Decel Time: 188 msec     Systemic Diam: 2.40 cm MV E velocity: 103.00 cm/s MV A velocity: 45.00 cm/s MV E/A ratio:  2.29 Ida Rogue MD Electronically signed by Ida Rogue MD Signature Date/Time: 11/25/2019/2:19:12 PM    Final    Assessment and plan- Patient is a 64 y.o. male who presents to symptom management for lab work due to possible dehydration secondary to abdominal pain and dysphagia.  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  Has PET scan scheduled for later on today.  Noted to have ascites-3.6 L of fluid was removed on Monday.  Dysphagia-secondary to malignancy.  Dietitian has recommended PEG tube should he not be able to maintain caloric intake.  He has persistent weight loss.  Has trouble swallowing pills.  Chronic constipation-has trouble swallowing Senokot-recommend switching to Senokot elixir.  Weakness-declined home health at this time.  He was willing to be provided a shower chair which was called in at his last visit.  Will require frequent smc visits.  Abdominal pain/swelling-stable on oxycodone.  Status post paracentesis Monday-3.6 L of fluid removed.   Dehydration-secondary to malignancy, malnutrition and dysphagia.  Will limit the amount of IV fluid given today secondary to recent  paracentesis.  See plan below.  Anorexia-secondary to malignancy.  Spoke with Dr. Tasia Catchings who recommends appetite stimulant.  RX 4 mg Decadron twice daily until treatment begins.  Daughter notes improvement with IV steroids.  Lower extremity swelling-likely secondary to third spacing-unlikely bilateral DVT.  Recommend elevation and TED hose.  Plan: Give 500 ml NS RX Decadron 4 mg BID RX senna syrup TED Hose Elevation when resting  Disposition: RTC on Friday 12/09/2019 for repeat lab work and follow-up.  Addendum-PET scan showed large hypermetabolic gastric cardia mass consistent with known carcinoma.  There appears to be directly infiltrating soft tissue mass and bulky gastrohepatic ligament periduodenal and retroperitoneal and mesenteric adenopathy.  Hypermetabolic mid level esophageal lymph node discovered.  No findings for pulmonary metastatic disease or hepatic metastatic disease.  Bilateral pleural effusion and moderate volume abdominal pelvic ascites.   Visit Diagnosis 1. 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 Nausea and vomiting, intractability of vomiting not specified, unspecified vomiting type     Patient expressed understanding and was in agreement with this plan. He also understands that He can [facility] at any time with any questions, concerns, or complaints.   Greater than 50% was spent in counseling and coordination of care with this patient including but not limited to discussion of the relevant topics above (See A&P) including, but not limited to diagnosis and management of acute and chronic medical conditions.   Thank you for allowing me to participate in the care of this very pleasant patient.    Jacquelin Hawking, NP [place] [facility] Cell - [phone] Pager- [phone] 12/11/2019 11:42 AM

## 2019-12-08 ENCOUNTER — Telehealth: Payer: Self-pay | Admitting: Primary Care

## 2019-12-08 ENCOUNTER — Other Ambulatory Visit: Payer: Self-pay | Admitting: Oncology

## 2019-12-08 ENCOUNTER — Other Ambulatory Visit: Payer: Medicare Other

## 2019-12-08 ENCOUNTER — Other Ambulatory Visit: Payer: Self-pay

## 2019-12-08 DIAGNOSIS — C160 Malignant neoplasm of cardia: Secondary | ICD-10-CM

## 2019-12-08 NOTE — Telephone Encounter (Signed)
Called patient to schedule Palliative Consult and spoke with patient's daughter, Ebony Hail.  After explaining Palliative services with daughter she requested to speak with her sister, Miquel Dunn, as well as the patient to make sure that he is agreeable with Palliative services.  She will [place] me back after discussing this with them to let me know what they have decided.

## 2019-12-09 ENCOUNTER — Inpatient Hospital Stay: Payer: Medicare Other

## 2019-12-09 ENCOUNTER — Other Ambulatory Visit: Payer: Self-pay

## 2019-12-09 ENCOUNTER — Inpatient Hospital Stay (HOSPITAL_BASED_OUTPATIENT_CLINIC_OR_DEPARTMENT_OTHER): Payer: Medicare Other | Admitting: Nurse Practitioner

## 2019-12-09 ENCOUNTER — Encounter: Payer: Self-pay | Admitting: Nurse Practitioner

## 2019-12-09 ENCOUNTER — Telehealth: Payer: Self-pay

## 2019-12-09 ENCOUNTER — Ambulatory Visit
Admission: RE | Admit: 2019-12-09 | Discharge: 2019-12-09 | Disposition: A | Discharge status: Home / Self Care | Payer: Medicare Other | Source: Ambulatory Visit | Attending: Nurse Practitioner | Admitting: Nurse Practitioner

## 2019-12-09 VITALS — BP 121/72 | HR 47 | Temp 95.8°F | Resp 20

## 2019-12-09 DIAGNOSIS — C160 Malignant neoplasm of cardia: Secondary | ICD-10-CM

## 2019-12-09 DIAGNOSIS — R531 Weakness: Secondary | ICD-10-CM

## 2019-12-09 DIAGNOSIS — G893 Neoplasm related pain (acute) (chronic): Secondary | ICD-10-CM | POA: Diagnosis not present

## 2019-12-09 DIAGNOSIS — R112 Nausea with vomiting, unspecified: Secondary | ICD-10-CM | POA: Diagnosis not present

## 2019-12-09 DIAGNOSIS — E43 Unspecified severe protein-calorie malnutrition: Secondary | ICD-10-CM | POA: Diagnosis not present

## 2019-12-09 DIAGNOSIS — R188 Other ascites: Secondary | ICD-10-CM | POA: Insufficient documentation

## 2019-12-09 DIAGNOSIS — R110 Nausea: Secondary | ICD-10-CM

## 2019-12-09 LAB — COMPREHENSIVE METABOLIC PANEL
ALT: 24 U/L (ref 0–44)
AST: 50 U/L — ABNORMAL HIGH (ref 15–41)
Albumin: 2.7 g/dL — ABNORMAL LOW (ref 3.5–5.0)
Alkaline Phosphatase: 62 U/L (ref 38–126)
Anion gap: 7 (ref 5–15)
BUN: 12 mg/dL (ref 8–23)
CO2: 25 mmol/L (ref 22–32)
Calcium: 8.3 mg/dL — ABNORMAL LOW (ref 8.9–10.3)
Chloride: 103 mmol/L (ref 98–111)
Creatinine, Ser: 0.78 mg/dL (ref 0.61–1.24)
GFR calc Af Amer: >60 mL/min (ref >60)
GFR calc non Af Amer: >60 mL/min (ref >60)
Glucose, Bld: 86 mg/dL (ref 70–99)
Potassium: 4.2 mmol/L (ref 3.5–5.1)
Sodium: 135 mmol/L (ref 135–145)
Total Bilirubin: 1.2 mg/dL (ref 0.3–1.2)
Total Protein: 5.9 g/dL — ABNORMAL LOW (ref 6.5–8.1)

## 2019-12-09 LAB — CBC WITH DIFFERENTIAL/PLATELET
Abs Immature Granulocytes: 0.02 10*3/uL (ref 0.00–0.07)
Basophils Absolute: 0 10*3/uL (ref 0.0–0.1)
Basophils Relative: 0 %
Eosinophils Absolute: 0.1 10*3/uL (ref 0.0–0.5)
Eosinophils Relative: 1 %
HCT: 32.4 % — ABNORMAL LOW (ref 39.0–52.0)
Hemoglobin: 10.9 g/dL — ABNORMAL LOW (ref 13.0–17.0)
Immature Granulocytes: 0 %
Lymphocytes Relative: 17 %
Lymphs Abs: 1.2 10*3/uL (ref 0.7–4.0)
MCH: 31.9 pg (ref 26.0–34.0)
MCHC: 33.6 g/dL (ref 30.0–36.0)
MCV: 94.7 fL (ref 80.0–100.0)
Monocytes Absolute: 0.8 10*3/uL (ref 0.1–1.0)
Monocytes Relative: 11 %
Neutro Abs: 5.2 10*3/uL (ref 1.7–7.7)
Neutrophils Relative %: 71 %
Platelets: 230 10*3/uL (ref 150–400)
RBC: 3.42 MIL/uL — ABNORMAL LOW (ref 4.22–5.81)
RDW: 15.6 % — ABNORMAL HIGH (ref 11.5–15.5)
WBC: 7.3 10*3/uL (ref 4.0–10.5)
nRBC: 0 % (ref 0.0–0.2)

## 2019-12-09 MED ORDER — ONDANSETRON HCL 4 MG/2ML IJ SOLN
8.0000 mg | Freq: Once | INTRAMUSCULAR | Status: AC
  Administered 2019-12-09: 8 mg via INTRAVENOUS
  Filled 2019-12-09: qty 4

## 2019-12-09 MED ORDER — DEXAMETHASONE SODIUM PHOSPHATE 10 MG/ML IJ SOLN
10.0000 mg | Freq: Once | INTRAMUSCULAR | Status: AC
  Administered 2019-12-09: 10 mg via INTRAVENOUS
  Filled 2019-12-09: qty 1

## 2019-12-09 MED ORDER — SODIUM CHLORIDE 0.9 % IV SOLN
Freq: Once | INTRAVENOUS | Status: AC
  Filled 2019-12-09: qty 250

## 2019-12-09 MED ORDER — MORPHINE SULFATE (PF) 2 MG/ML IV SOLN
1.0000 mg | Freq: Once | INTRAVENOUS | Status: AC
  Administered 2019-12-09: 1 mg via INTRAVENOUS
  Filled 2019-12-09: qty 1

## 2019-12-09 NOTE — Telephone Encounter (Signed)
The 8/4 radiation sim appointment has been moved to Monday 8/2.  Ana spoke with patient's daughter.

## 2019-12-09 NOTE — Telephone Encounter (Addendum)
Staff message received from Dr. Tasia Catchings on 12/09/19:  Please set him up for palliative radiation simulation ASAP. Thanks.   Order entered and will get him scheduled.  Also have forwarded message to Cori Razor, RN in radiation.

## 2019-12-09 NOTE — Progress Notes (Signed)
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 EGD found esophageal stenosis with mass at GE junction. Pathology positive for squamous cell carcinoma. Additionally found to have left piriform sinus mass thought to be benign by ENT. Aortic stenosis felt to not be amenable to treatment currently given new malignancy. He has had > 6 month history of difficulty swallowing solids an dliquids with associated weight loss.  Underwent palliative paracentesis on 12/06/19 which was negative for malignancy.  Pet on 12/07/19 revealed large hypermetabolic gastric cardia mass infiltrating surrounding soft tissues with associated bulky adenopathy. Also, upward lymphatic disease with enlarged and hypermetabolic mid level esophageal lymph node. No pulmonary or liver mets. Bilateral pleural effusions with moderate abdominal pelvic ascites.  Oncology History   No history exists.    Interval history- [facility] for complaints of upper abdominal pain which radiates up esophagus and to his back. Site of ongoing pain. Describes as sharp. Rates pain 2/10. Hasn't been taking pain medication due to size of pill & difficulty swallowing. Constipated. Bowel movement this morning but has 1-2 bowel movements per week. Has taken senna liquid.  Minimal oral intake though somewhat improved. Chronic bilateral lower extremity swelling. Minimal ambulation. Tired all the time. Hasn't started steroids. Intermittent nausea. No vomiting. Feels 'better' after iv fluids. Abdomen feels somewhat more full again. Was previously improved after paracentesis. Accompanied by daughter today. Family requesting discussion of PET scan. They have seen Dr. Baruch Gouty for discussion of radiation. Followed by palliative care. No dizziness or falls. No fevers or chills. No easy bleeding or bruising. Denies chest pain or shortness of breath. Denies diarrhea. No urinary complaints. Nother specific complaints today.   ECOG FS: 1-2  Review of systems- Review of Systems  Constitutional: Positive for malaise/fatigue and weight loss. Negative for chills and fever.  HENT: Negative for hearing loss, nosebleeds, sore throat and tinnitus.        Difficulty swallowing  Eyes: Negative for blurred vision and double vision.  Respiratory: Negative for cough, hemoptysis, shortness of breath and wheezing.   Cardiovascular: Positive for leg swelling. Negative for chest pain and palpitations.  Gastrointestinal: Positive for abdominal pain and constipation. Negative for blood in stool, diarrhea, melena, nausea and vomiting.  Genitourinary: Negative for dysuria and urgency.  Musculoskeletal: Positive for back pain. Negative for falls, joint pain and myalgias.  Skin: Negative for itching and rash.  Neurological: Positive for weakness. Negative for dizziness, tingling, sensory change, loss of consciousness and headaches.  Endo/Heme/Allergies: Negative for environmental allergies. Does not bruise/bleed easily.  Psychiatric/Behavioral: Negative for depression. The patient is not nervous/anxious and does not have insomnia.        Forgetful per daughter     No Known Allergies  Past Medical History:  Diagnosis Date  . Anxiety   . Coronary artery disease   . Depression   . Heart murmur    . Hypertension   .  Nausea and vomiting 12/01/2019    Past Surgical History:  Procedure Laterality Date  . CARDIAC CATHETERIZATION    . CORONARY ARTERY BYPASS GRAFT    . CORONARY ARTERY BYPASS GRAFT N/A 04/18/2013   Procedure: CORONARY ARTERY BYPASS GRAFTING (CABG);  Surgeon: Gaye Pollack, MD;  Location: [place];  Service: Open Heart Surgery;  Laterality: N/A;  Times  3  using left internal mammary artery and endoscopically harvested right saphenous vein  . ENDOVEIN HARVEST OF GREATER SAPHENOUS VEIN Right 04/18/2013   Procedure: ENDOVEIN HARVEST OF GREATER SAPHENOUS VEIN;  Surgeon: Gaye Pollack, MD;  Location: [place];  Service: Open Heart Surgery;  Laterality: Right;  Some of the saphenous vein from the lower leg also used.  . ESOPHAGOGASTRODUODENOSCOPY (EGD) WITH PROPOFOL N/A 11/26/2019   Procedure: ESOPHAGOGASTRODUODENOSCOPY (EGD) WITH PROPOFOL;  Surgeon: Lin Landsman, MD;  Location: [place];  Service: Gastroenterology;  Laterality: N/A;  . INTRAOPERATIVE TRANSESOPHAGEAL ECHOCARDIOGRAM N/A 04/18/2013   Procedure: INTRAOPERATIVE TRANSESOPHAGEAL ECHOCARDIOGRAM;  Surgeon: Gaye Pollack, MD;  Location: [facility];  Service: Open Heart Surgery;  Laterality: N/A;    Social History   Socioeconomic History  . Marital status: Married    Spouse name: Not on file  . Number of children: Not on file  . Years of education: Not on file  . Highest education level: Not on file  Occupational History  . Occupation: not employed     Fish farm manager: [employer]  Tobacco Use  . Smoking status: Former Smoker    Types: Cigars    Quit date: 11/24/2019    Years since quitting: 0.0  . Smokeless tobacco: Never Used  . Tobacco comment: 1-2 cigars/day  Vaping Use  . Vaping Use: Never used  Substance and Sexual Activity  . Alcohol use: Yes    Comment: 5 bottles of wine/week/ Quit drinking about 5 months   . Drug use: No  . Sexual activity: Never  Other Topics Concern  . Not on file  Social History  Narrative  . Not on file   Social Determinants of Health   Financial Resource Strain:   . Difficulty of Paying Living Expenses:   Food Insecurity:   . Worried About Charity fundraiser in the Last Year:   . Arboriculturist in the Last Year:   Transportation Needs:   . Film/video editor (Medical):   Marland Kitchen Lack of Transportation (Non-Medical):   Physical Activity:   . Days of Exercise per Week:   . Minutes of Exercise per Session:   Stress:   . Feeling of Stress :   Social Connections:   . Frequency of Communication with Friends and Family:   . Frequency of Social Gatherings with Friends and Family:   . Attends Religious Services:   . Active Member of Clubs or Organizations:   . Attends Archivist Meetings:   Marland Kitchen Marital Status:   Intimate Partner Violence:   . Fear of Current or Ex-Partner:   . Emotionally Abused:   Marland Kitchen Physically Abused:   . Sexually Abused:     Family History  Problem Relation Age of Onset  . Diabetes Mother      Current Outpatient Medications:  .  acetaminophen (TYLENOL) 325 MG tablet, Take 650 mg by mouth every 6 (six) hours as needed., Disp: , Rfl:  .  dexamethasone (DECADRON) 4 MG tablet, Take 1 tablet (4 mg total) by mouth 2 (two) times daily., Disp: 14 tablet, Rfl: 0 .  fentaNYL (DURAGESIC) 12 MCG/HR, Place 1 patch onto the skin every 3 (three) days., Disp: 5 patch, Rfl: 0 .  ferrous sulfate 325 (65 FE) MG tablet, Take 1 tablet (325 mg total) by mouth daily with breakfast. (Patient not taking: Reported on 12/07/2019), Disp: 30 tablet, Rfl: 0 .  magnesium chloride (SLOW-MAG) 64 MG TBEC SR tablet, Take 1 tablet (64 mg total) by mouth daily., Disp: 30 tablet, Rfl: 0 .  metoprolol tartrate (LOPRESSOR) 25 MG tablet, Take 25 mg by mouth daily., Disp: , Rfl:  .  ondansetron (ZOFRAN ODT) 8 MG disintegrating tablet, Take 1 tablet (8 mg total) by mouth every 6 (six) hours as needed for nausea or vomiting., Disp: 60 tablet, Rfl: 1 .  oxyCODONE (OXY  IR/ROXICODONE) 5 MG immediate release tablet, Take 1 tablet (5 mg total) by mouth every 6 (six) hours as needed for severe pain., Disp: 30 tablet, Rfl: 0 .  pantoprazole (PROTONIX) 40 MG tablet, Take 1 tablet (40 mg total) by mouth 2 (two) times daily., Disp: 60 tablet, Rfl: 0 .  polyethylene glycol (MIRALAX) 17 g packet, Take 17 g by mouth daily as needed., Disp: 14 each, Rfl: 0 .  prochlorperazine (COMPAZINE) 10 MG tablet, Take 1 tablet (10 mg total) by mouth every 6 (six) hours as needed for nausea or vomiting., Disp: 60 tablet, Rfl: 0 .  senna (SENOKOT) 8.6 MG TABS tablet, Take 2 tablets (17.2 mg total) by mouth in the morning and at bedtime., Disp: 120 tablet, Rfl: 0 .  Sennosides (SENNA) 8.8 MG/5ML SYRP, Take 5-10 mLs (8.8-17.6 mg total) by mouth 2 (two) times daily., Disp: 105 mL, Rfl: 0 .  traZODone (DESYREL) 50 MG tablet, Take 50 mg by mouth at bedtime., Disp: , Rfl:  No current facility-administered medications for this visit.  Facility-Administered Medications Ordered in Other Visits:  .  sodium chloride flush (NS) 0.9 % injection 3 mL, 3 mL, Intravenous, Q12H, Visser, Jacquelyn D, PA-C  Physical exam: There were no vitals filed for this visit. Physical Exam   CMP Latest Ref Rng & Units 12/07/2019  Glucose 70 - 99 mg/dL 88  BUN 8 - 23 mg/dL 14  Creatinine 0.61 - 1.24 mg/dL 0.65  Sodium 135 - 145 mmol/L 134(L)  Potassium 3.5 - 5.1 mmol/L 3.4(L)  Chloride 98 - 111 mmol/L 105  CO2 22 - 32 mmol/L 24  Calcium 8.9 - 10.3 mg/dL 8.1(L)  Total Protein 6.5 - 8.1 g/dL 5.6(L)  Total Bilirubin 0.3 - 1.2 mg/dL 0.7  Alkaline Phos 38 - 126 U/L 57  AST 15 - 41 U/L 30  ALT 0 - 44 U/L 18   CBC Latest Ref Rng & Units 12/09/2019  WBC 4.0 - 10.5 K/uL 7.3  Hemoglobin 13.0 - 17.0 g/dL 10.9(L)  Hematocrit 39 - 52 % 32.4(L)  Platelets 150 - 400 K/uL 230    No images are attached to the encounter.  DG Chest 2 View  Result Date: 11/26/2019 CLINICAL DATA:  Cough and weakness EXAM: CHEST - 2 VIEW  COMPARISON:  Chest radiograph 10/19/2019 FINDINGS: Stable cardiomediastinal contours status post median sternotomy. Minimal atelectasis at the right lung base. No focal consolidation. On lateral view there is a 9 mm nodule inferiorly which is not well seen on the frontal projection. No pneumothorax or pleural effusion. No acute finding in the visualized skeleton. IMPRESSION: 1. No evidence of active disease in the chest. 2. 9 mm nodule inferiorly on the lateral view which is not well seen on the frontal  projection. Consider nonemergent chest CT for further evaluation. Electronically Signed   By: Audie Pinto M.D.   On: 11/26/2019 17:12   US Abdomen Complete  Result Date: 12/05/2019 CLINICAL DATA:  Distended abdomen EXAM: ABDOMEN ULTRASOUND COMPLETE COMPARISON:  Ultrasound abdomen 10/19/2019 FINDINGS: Gallbladder: No gallstones or wall thickening visualized. No sonographic Murphy sign noted by sonographer. Common bile duct: Diameter: 3.8 mm Liver: Diffusely increased echogenicity liver parenchyma seen previously. No focal lesion. Portal vein is patent on color Doppler imaging with normal direction of blood flow towards the liver. IVC: No abnormality visualized. Pancreas: Visualized portion unremarkable. Spleen: Size and appearance within normal limits. Right Kidney: Length: 10.6 cm. Echogenicity within normal limits. No mass or hydronephrosis visualized. Left Kidney: Length: 10.1 cm. Echogenicity within normal limits. No mass or hydronephrosis visualized. Abdominal aorta: No aneurysm visualized. Other findings: Moderate ascites.  Right pleural effusion. IMPRESSION: Echogenic liver suggesting fatty infiltration as noted previously Negative for gallstones Moderate ascites.  Right pleural effusion. Electronically Signed   By: Franchot Gallo M.D.   On: 12/05/2019 14:32   NM PET Image Initial (PI) Skull Base To Thigh  Result Date: 12/07/2019 CLINICAL DATA:  Initial treatment strategy for GE junction  carcinoma. EXAM: NUCLEAR MEDICINE PET SKULL BASE TO THIGH TECHNIQUE: 8.21 mCi F-18 FDG was injected intravenously. Full-ring PET imaging was performed from the skull base to thigh after the radiotracer. CT data was obtained and used for attenuation correction and anatomic localization. Fasting blood glucose: 78 mg/dl COMPARISON:  None. FINDINGS: Mediastinal blood pool activity: SUV max 2.10 Liver activity: SUV max NA NECK: No hypermetabolic lymph nodes in the neck. Incidental CT findings: Extensive age advanced carotid artery calcifications. CHEST: 12 mm left paraesophageal lymph node on image 297/9 is hypermetabolic with SUV max of 8.92 and consistent with nodal metastasis. Moderate left and small right pleural effusions are noted. No hypermetabolic pleural nodules to suggest pleural metastatic disease. No worrisome pulmonary nodules on the lung windows to suggest pulmonary metastatic disease. No other hypermetabolic mediastinal or hilar lymph nodes. Incidental CT findings: Age advanced vascular calcifications and surgical changes from prior gastric bypass surgery. Extensive three-vessel coronary artery calcifications. ABDOMEN/PELVIS: There is a large irregular mass involving the gastric cardia just below the GE junction measuring approximately 5.0 x 4.8 cm. There appears to be direct extension into the surrounding soft tissues with a large nodal mass in the gastrohepatic ligament. Right-sided hepatic duodenum ligament adenopathy the measuring 2 cm with SUV max of 9.01. Mesenteric nodal mass adjacent to the SMA measures 3.5 cm on image 156/3. SUV max is 11.56. Right-sided retroperitoneal nodal mass measures 2.5 cm on image 165/3. SUV max is 11.86. No findings for hepatic metastatic disease. Moderate abdominal/pelvic ascites but no obvious omental or peritoneal surface lesions. Incidental CT findings: Significant age advanced vascular disease. SKELETON: No focal hypermetabolic activity to suggest skeletal  metastasis. Incidental CT findings: none IMPRESSION: 1. 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 It appears to be directly infiltrating the surrounding soft tissues and there is bulky gastrohepatic ligament, paraduodenal, retroperitoneal and mesenteric adenopathy. There is also upward lymphatic disease with an enlarged and hypermetabolic mid level esophageal lymph node. 2. No findings for pulmonary metastatic disease or hepatic metastatic disease. 3. Bilateral pleural effusions and moderate volume abdominal/pelvic ascites. Electronically Signed   By: Marijo Sanes M.D.   On: 12/07/2019 15:49   US Paracentesis  Result Date: 12/06/2019 INDICATION: Patient with gastric cardia mass likely malignancy, new onset abdominal distension. Request to IR  for diagnostic and therapeutic paracentesis. EXAM: ULTRASOUND GUIDED DIAGNOSTIC AND THERAPEUTIC PARACENTESIS MEDICATIONS: 10 mL 1% lidocaine COMPLICATIONS: None immediate. PROCEDURE: Informed written consent was obtained from the patient after a discussion of the risks, benefits and alternatives to treatment. A timeout was performed prior to the initiation of the procedure. Initial ultrasound scanning demonstrates a moderate amount of ascites within the right lower abdominal quadrant. The right lower abdomen was prepped and draped in the usual sterile fashion. 1% lidocaine was used for local anesthesia. Following this, a 6 Fr Safe-T-Centesis catheter was introduced. An ultrasound image was saved for documentation purposes. The paracentesis was performed. The catheter was removed and a dressing was applied. The patient tolerated the procedure well without immediate post procedural complication. FINDINGS: A total of approximately 3.8 L of hazy yellow fluid was removed. Samples were sent to the laboratory as requested by the clinical team. IMPRESSION: Successful ultrasound-guided paracentesis yielding 3.8 liters of peritoneal fluid. Read by  Candiss Norse, PA-C Electronically Signed   By: Sandi Mariscal M.D.   On: 12/06/2019 12:31   ECHOCARDIOGRAM COMPLETE  Result Date: 11/25/2019    ECHOCARDIOGRAM REPORT   Patient Name:   Vernon Patterson Date of Exam: 11/25/2019 Medical Rec #:  [id number]      Height:       68.0 in Accession #:    [id number]     Weight:       129.9 lb Date of Birth:  [birth year]      BSA:          1.701 m Patient Age:    67 years       BP:           143/81 mmHg Patient Gender: M              HR:           58 bpm. Exam Location:  [facility] Procedure: 2D Echo, Color Doppler and Cardiac Doppler Indications:     R01.2 Abnormal Heart Sounds Nec  History:         Patient has no prior history of Echocardiogram examinations.                  CAD; Prior CABG.  Sonographer:     Charmayne Sheer RDCS (AE) Referring Phys:  [id number] Arvil Chaco Diagnosing Phys: Ida Rogue MD IMPRESSIONS  1. Left ventricular ejection fraction, by estimation, is 60 to 65%. The left ventricle has normal function. The left ventricle has no regional wall motion abnormalities. Left ventricular diastolic parameters are consistent with Grade II diastolic dysfunction (pseudonormalization).  2. Right ventricular systolic function is normal. The right ventricular size is normal. There is mildly elevated pulmonary artery systolic pressure.  3. Left atrial size was mildly dilated.  4. The aortic valve is heavily calcified. Aortic valve regurgitation is mild. Severe aortic valve stenosis. Aortic valve area, by VTI measures 0.86 cm. Aortic valve mean gradient measures 38.0 mmHg. Aortic valve Vmax measures 4.02 m/s. FINDINGS  Left Ventricle: Left ventricular ejection fraction, by estimation, is 60 to 65%. The left ventricle has normal function. The left ventricle has no regional wall motion abnormalities. The left ventricular internal cavity size was normal in size. There is  no left ventricular hypertrophy. Left ventricular diastolic parameters are consistent with Grade II  diastolic dysfunction (pseudonormalization). Right Ventricle: The right ventricular size is normal. No increase in right ventricular wall thickness. Right ventricular systolic function is normal. There is mildly elevated pulmonary artery systolic  pressure. The tricuspid regurgitant velocity is 2.53  m/s, and with an assumed right atrial pressure of 10 mmHg, the estimated right ventricular systolic pressure is 82.5 mmHg. Left Atrium: Left atrial size was mildly dilated. Right Atrium: Right atrial size was normal in size. Pericardium: There is no evidence of pericardial effusion. Mitral Valve: The mitral valve is normal in structure. There is mild thickening of the mitral valve leaflet(s). Normal mobility of the mitral valve leaflets. Mild mitral valve regurgitation. No evidence of mitral valve stenosis. MV peak gradient, 4.2 mmHg. The mean mitral valve gradient is 2.0 mmHg. Tricuspid Valve: The tricuspid valve is normal in structure. Tricuspid valve regurgitation is not demonstrated. No evidence of tricuspid stenosis. Aortic Valve: The aortic valve is normal in structure. Aortic valve regurgitation is mild. Severe aortic stenosis is present. Aortic valve mean gradient measures 38.0 mmHg. Aortic valve peak gradient measures 64.6 mmHg. Aortic valve area, by VTI measures  0.86 cm. Pulmonic Valve: The pulmonic valve was normal in structure. Pulmonic valve regurgitation is not visualized. No evidence of pulmonic stenosis. Aorta: The aortic root is normal in size and structure. Venous: The inferior vena cava is normal in size with greater than 50% respiratory variability, suggesting right atrial pressure of 3 mmHg. IAS/Shunts: No atrial level shunt detected by color flow Doppler.  LEFT VENTRICLE PLAX 2D LVIDd:         3.94 cm  Diastology LVIDs:         2.13 cm  LV e' lateral:   9.90 cm/s LV PW:         1.26 cm  LV E/e' lateral: 10.4 LV IVS:        1.13 cm  LV e' medial:    5.22 cm/s LVOT diam:     2.40 cm  LV E/e' medial:   19.7 LV SV:         89 LV SV Index:   52 LVOT Area:     4.52 cm  RIGHT VENTRICLE RV Basal diam:  3.15 cm LEFT ATRIUM             Index       RIGHT ATRIUM           Index LA diam:        4.20 cm 2.47 cm/m  RA Area:     12.70 cm LA Vol (A2C):   62.5 ml 36.74 ml/m RA Volume:   27.80 ml  16.34 ml/m LA Vol (A4C):   48.2 ml 28.33 ml/m LA Biplane Vol: 55.2 ml 32.45 ml/m  AORTIC VALVE                    PULMONIC VALVE AV Area (Vmax):    0.82 cm     PV Vmax:       2.01 m/s AV Area (Vmean):   0.95 cm     PV Vmean:      135.000 cm/s AV Area (VTI):     0.86 cm     PV VTI:        0.470 m AV Vmax:           402.00 cm/s  PV Peak grad:  16.2 mmHg AV Vmean:          247.500 cm/s PV Mean grad:  9.0 mmHg AV VTI:            1.040 m AV Peak Grad:      64.6 mmHg AV Mean Grad:  38.0 mmHg LVOT Vmax:         72.50 cm/s LVOT Vmean:        51.900 cm/s LVOT VTI:          0.197 m LVOT/AV VTI ratio: 0.19  AORTA Ao Root diam: 3.80 cm MITRAL VALVE                TRICUSPID VALVE MV Area (PHT): 4.04 cm     TR Peak grad:   25.6 mmHg MV Peak grad:  4.2 mmHg     TR Vmax:        253.00 cm/s MV Mean grad:  2.0 mmHg MV Vmax:       1.02 m/s     SHUNTS MV Vmean:      58.1 cm/s    Systemic VTI:  0.20 m MV Decel Time: 188 msec     Systemic Diam: 2.40 cm MV E velocity: 103.00 cm/s MV A velocity: 45.00 cm/s MV E/A ratio:  2.29 Ida Rogue MD Electronically signed by Ida Rogue MD Signature Date/Time: 11/25/2019/2:19:12 PM    Final     Assessment and plan- Patient is a 64 y.o. male diagnosed with squamous cell carcinoma of GE junction who presents to Symptom Management for weakness, weight loss, abdominal fullness, and pain.  1. Squamous Cell Carcinoma of GE junction- pathology from biopsy of cardia mass on 11/26/19 revealed invasive poorly differentiated squamous cell carcinoma. He has seen Dr. Baruch Gouty for discussion of possible concurrent radiation and chemotherapy. PET scan today reviewed at patient and daughter's request. Advised  that Dr. Tasia Catchings will review in detail as well as staging, implications, and treatment planning.   2. Ascites- likely secondary to malignancy. Ultrasound with palliative paracentesis with cytology today yielded 2.2 L of fluid. Cytology pending. Could consider abdominal pleurx if persistent symptoms or not improved with chemotherapy.   3. Weight loss/difficlty swallowing- secondary to malignancy. BMI 22.43. Approximately 30-40 lb weight loss at time of diagnosis. Dexamethasone IV in clinic. Start decadron prescribed by Rulon Abide, NP tomorrow. Iv fluids in clinic. Likely not a candidate for feeding tube given ascites. Discussed with palliative care who agrees. Continue follow up with Jennet Maduro, Dietitian   4. Pain- secondary to malignancy. Morphine IV in clinic today. Continue oxycodone with stool softeners and laxatives as prescribed.   5. Constipation- Encouraged titration of miralax and senna liquid to achieve bowel movement every to every other day.   Follow up with Dr. Tasia Catchings, Dr. Baruch Gouty, labs, and Palliative care on Monday, 12/12/19.    Visit Diagnosis 1. 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 Other ascites   3. Cancer related pain   4. Severe protein-calorie malnutrition ([place])     Patient expressed understanding and was in agreement with this plan. He also understands that He can [facility] at any time with any questions, concerns, or complaints.   A total of (45) minutes of face-to-face time was spent with this patient.   Thank you for allowing me to participate in the care of this very pleasant patient.   Beckey Rutter, DNP, AGNP-C [facility]

## 2019-12-09 NOTE — Progress Notes (Signed)
Tumor Board Documentation  Vernon Patterson was presented by Verlon Au, RN at our Tumor Board on 12/08/2019, which included representatives from medical oncology, radiation oncology, surgical oncology, internal medicine, navigation, pathology, radiology, surgical, pharmacy, genetics, research, palliative care, pulmonology.  Vernon Patterson currently presents as a current patient, for [name], for new positive pathology with history of the following treatments: active survellience, surgical intervention(s).  Additionally, we reviewed previous medical and familial history, history of present illness, and recent lab results along with all available histopathologic and imaging studies. The tumor board considered available treatment options and made the following recommendations: Concurrent chemo-radiation therapy (followed by chemotherapy, Not a surgical candidate)    The following procedures/referrals were also placed: No orders of the defined types were placed in this encounter.   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  Tumor board is a meeting of clinicians from various specialty areas who evaluate and discuss patients for whom a multidisciplinary approach is being considered. Final determinations in the plan of care are those of the provider(s). The responsibility for follow up of recommendations given during tumor board is that of the provider.   Today's extended care, comprehensive team conference, Vernon Patterson was not present for the discussion and was not examined.   Multidisciplinary Tumor Board is a multidisciplinary case peer review process.  Decisions discussed in the Multidisciplinary Tumor Board reflect the opinions of the specialists present at the conference without having examined the  patient.  Ultimately, treatment and diagnostic decisions rest with the primary provider(s) and the patient.

## 2019-12-09 NOTE — Progress Notes (Signed)
Pt and daughter in for follow up and symptom management today.  Pt reports epigastric and lower back pain.  Pt states some improvement in appetite, able to drink milkshakes and had a crepe and other small amounts of soft foods.  Daughter states pt having more bloating and increased swelling in feet and legs.

## 2019-12-10 LAB — BODY FLUID CULTURE: Culture: NO GROWTH

## 2019-12-12 ENCOUNTER — Encounter: Payer: Self-pay | Admitting: Oncology

## 2019-12-12 ENCOUNTER — Telehealth: Payer: Self-pay

## 2019-12-12 ENCOUNTER — Ambulatory Visit
Admission: RE | Admit: 2019-12-12 | Discharge: 2019-12-12 | Disposition: A | Discharge status: Home / Self Care | Payer: Medicare Other | Source: Ambulatory Visit | Attending: Oncology | Admitting: Oncology

## 2019-12-12 ENCOUNTER — Inpatient Hospital Stay (HOSPITAL_BASED_OUTPATIENT_CLINIC_OR_DEPARTMENT_OTHER): Payer: Medicare Other | Admitting: Oncology

## 2019-12-12 ENCOUNTER — Other Ambulatory Visit: Payer: Self-pay

## 2019-12-12 ENCOUNTER — Ambulatory Visit: Payer: Medicare Other | Attending: Radiation Oncology

## 2019-12-12 ENCOUNTER — Encounter: Payer: Medicare Other | Admitting: Hospice and Palliative Medicine

## 2019-12-12 ENCOUNTER — Inpatient Hospital Stay: Payer: Medicare Other | Attending: Hospice and Palliative Medicine | Admitting: Hospice and Palliative Medicine

## 2019-12-12 ENCOUNTER — Inpatient Hospital Stay: Payer: Medicare Other

## 2019-12-12 VITALS — BP 101/67 | HR 52 | Temp 95.3°F | Resp 16

## 2019-12-12 DIAGNOSIS — E43 Unspecified severe protein-calorie malnutrition: Secondary | ICD-10-CM | POA: Insufficient documentation

## 2019-12-12 DIAGNOSIS — M7989 Other specified soft tissue disorders: Secondary | ICD-10-CM | POA: Insufficient documentation

## 2019-12-12 DIAGNOSIS — C160 Malignant neoplasm of cardia: Secondary | ICD-10-CM | POA: Diagnosis present

## 2019-12-12 DIAGNOSIS — G893 Neoplasm related pain (acute) (chronic): Secondary | ICD-10-CM

## 2019-12-12 DIAGNOSIS — C155 Malignant neoplasm of lower third of esophagus: Secondary | ICD-10-CM | POA: Insufficient documentation

## 2019-12-12 DIAGNOSIS — Z515 Encounter for palliative care: Secondary | ICD-10-CM | POA: Diagnosis not present

## 2019-12-12 DIAGNOSIS — Z79899 Other long term (current) drug therapy: Secondary | ICD-10-CM | POA: Diagnosis not present

## 2019-12-12 DIAGNOSIS — Z7189 Other specified counseling: Secondary | ICD-10-CM

## 2019-12-12 DIAGNOSIS — I82462 Acute embolism and thrombosis of left calf muscular vein: Secondary | ICD-10-CM | POA: Diagnosis not present

## 2019-12-12 DIAGNOSIS — R188 Other ascites: Secondary | ICD-10-CM | POA: Insufficient documentation

## 2019-12-12 DIAGNOSIS — K5900 Constipation, unspecified: Secondary | ICD-10-CM | POA: Diagnosis not present

## 2019-12-12 DIAGNOSIS — D490 Neoplasm of unspecified behavior of digestive system: Secondary | ICD-10-CM

## 2019-12-12 LAB — COMPREHENSIVE METABOLIC PANEL
ALT: 29 U/L (ref 0–44)
AST: 28 U/L (ref 15–41)
Albumin: 2.8 g/dL — ABNORMAL LOW (ref 3.5–5.0)
Alkaline Phosphatase: 67 U/L (ref 38–126)
Anion gap: 10 (ref 5–15)
BUN: 14 mg/dL (ref 8–23)
CO2: 24 mmol/L (ref 22–32)
Calcium: 8.4 mg/dL — ABNORMAL LOW (ref 8.9–10.3)
Chloride: 100 mmol/L (ref 98–111)
Creatinine, Ser: 0.7 mg/dL (ref 0.61–1.24)
GFR calc Af Amer: >60 mL/min (ref >60)
GFR calc non Af Amer: >60 mL/min (ref >60)
Glucose, Bld: 117 mg/dL — ABNORMAL HIGH (ref 70–99)
Potassium: 4.1 mmol/L (ref 3.5–5.1)
Sodium: 134 mmol/L — ABNORMAL LOW (ref 135–145)
Total Bilirubin: 0.6 mg/dL (ref 0.3–1.2)
Total Protein: 5.9 g/dL — ABNORMAL LOW (ref 6.5–8.1)

## 2019-12-12 LAB — CBC WITH DIFFERENTIAL/PLATELET
Abs Immature Granulocytes: 0.04 10*3/uL (ref 0.00–0.07)
Basophils Absolute: 0 10*3/uL (ref 0.0–0.1)
Basophils Relative: 0 %
Eosinophils Absolute: 0 10*3/uL (ref 0.0–0.5)
Eosinophils Relative: 0 %
HCT: 33 % — ABNORMAL LOW (ref 39.0–52.0)
Hemoglobin: 11.1 g/dL — ABNORMAL LOW (ref 13.0–17.0)
Immature Granulocytes: 0 %
Lymphocytes Relative: 10 %
Lymphs Abs: 1.1 10*3/uL (ref 0.7–4.0)
MCH: 31.5 pg (ref 26.0–34.0)
MCHC: 33.6 g/dL (ref 30.0–36.0)
MCV: 93.8 fL (ref 80.0–100.0)
Monocytes Absolute: 0.7 10*3/uL (ref 0.1–1.0)
Monocytes Relative: 6 %
Neutro Abs: 9.1 10*3/uL — ABNORMAL HIGH (ref 1.7–7.7)
Neutrophils Relative %: 84 %
Platelets: 209 10*3/uL (ref 150–400)
RBC: 3.52 MIL/uL — ABNORMAL LOW (ref 4.22–5.81)
RDW: 15.6 % — ABNORMAL HIGH (ref 11.5–15.5)
WBC: 10.9 10*3/uL — ABNORMAL HIGH (ref 4.0–10.5)
nRBC: 0 % (ref 0.0–0.2)

## 2019-12-12 LAB — MAGNESIUM: Magnesium: 1.8 mg/dL (ref 1.7–2.4)

## 2019-12-12 MED ORDER — FUROSEMIDE 20 MG PO TABS
20.0000 mg | ORAL_TABLET | Freq: Every day | ORAL | 0 refills | Status: AC | PRN
Start: 2019-12-12 — End: ?

## 2019-12-12 MED ORDER — LACTULOSE 10 GM/15ML PO SOLN: 10.0000 g | Freq: Two times a day (BID) | ORAL | 0 refills | Status: AC | PRN

## 2019-12-12 MED ORDER — ELIQUIS DVT/PE STARTER PACK 5 MG PO TBPK: ORAL_TABLET | ORAL | 0 refills | Status: AC

## 2019-12-12 NOTE — Telephone Encounter (Signed)
Patient/daughters notified of a left lower leg DVT seen on venous US today.  Dr. Tasia Catchings has sent rx for Eliquis.  Roselle Locus to get rx for Eliquis and start today, instructions will be included with rx, also that he will have a higher risk of bleeding on the Eliquis.    Miquel Dunn voiced understanding and will Bouch the office with any further questions or concerns she may have.

## 2019-12-12 NOTE — Progress Notes (Signed)
Hematology/Oncology Follow Up Note [facility]  Telephone:([phone] [phone]  Patient Care Team: Albina Billet, MD as PCP - General (Internal Medicine) Dionisio David, MD (Cardiology) Clent Jacks, RN as Oncology Nurse Navigator Earlie Server, MD as Consulting Physician (Oncology)   Name of the patient: Vernon Patterson  [id number]  1955-07-15   REASON FOR VISIT  follow-up for squamous cell carcinoma  PERTINENT ONCOLOGY HISTORY Vernon Patterson is a 64 y.o. 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   Patient was recently admitted due to profound weakness, unintentional weight loss, dark stool, nausea and vomiting. EGD showed gastric cardia mass, malignant appearing GE junction stenosis, left piriform sinus ulceration.  Gastric cardia mass was biopsied.  CBC showed hemoglobin of 7.4.  He received PRBC transfusion. Physical examination reviewed significant systolic murmur.  Patient was seen by cardiologist. Patient was given supportive care including IV fluid hydration, antiemetics, PPI. Also was evaluated by ENT.  INTERVAL HISTORY Vernon Patterson is a 64 y.o. male who has above history reviewed by me today presents for follow up visit for management of squamous cell carcinoma Problems and complaints are listed below: Patient was accompanied by daughter. Patient reports appetite is slightly improved.  No nausea or vomiting. Abdominal fullness improved after paracentesis. Patient has tried Senokot which did not relieve his constipation.  He has not tried suppository yet. Bilateral lower extremity swelling, leaking fluid.    Review of Systems  Constitutional: Positive for appetite change, fatigue and unexpected weight change. Negative for chills and fever.  HENT:   Negative for hearing loss and voice change.    Eyes: Negative for eye problems and icterus.  Respiratory: Negative for chest tightness, cough and shortness of breath.   Cardiovascular: Positive for leg swelling. Negative for chest pain.  Gastrointestinal: Negative for abdominal distention, abdominal pain, nausea and vomiting.  Endocrine: Negative for hot flashes.  Genitourinary: Negative for difficulty urinating, dysuria and frequency.   Musculoskeletal: Negative for arthralgias.  Skin: Negative for itching and rash.  Neurological: Negative for light-headedness and numbness.  Hematological: Negative for adenopathy. Does not bruise/bleed easily.  Psychiatric/Behavioral: Negative for confusion.      No Known Allergies   Past Medical History:  Diagnosis Date  . Anxiety   . Coronary artery disease   . Depression   . Heart murmur   . Hypertension   . Nausea and vomiting 12/01/2019     Past Surgical History:  Procedure Laterality Date  . CARDIAC CATHETERIZATION    . CORONARY ARTERY BYPASS GRAFT    . CORONARY ARTERY BYPASS GRAFT N/A 04/18/2013   Procedure: CORONARY ARTERY BYPASS GRAFTING (CABG);  Surgeon: Gaye Pollack, MD;  Location: [place];  Service: Open Heart Surgery;  Laterality: N/A;  Times  3  using left internal mammary artery and endoscopically harvested right saphenous vein  . ENDOVEIN HARVEST OF GREATER SAPHENOUS VEIN Right 04/18/2013   Procedure: ENDOVEIN HARVEST OF GREATER SAPHENOUS VEIN;  Surgeon: Gaye Pollack, MD;  Location: [place];  Service: Open Heart Surgery;  Laterality: Right;  Some of the saphenous vein from the lower leg also used.  . ESOPHAGOGASTRODUODENOSCOPY (EGD) WITH PROPOFOL N/A 11/26/2019   Procedure: ESOPHAGOGASTRODUODENOSCOPY (EGD) WITH PROPOFOL;  Surgeon: Lin Landsman, MD;  Location: [place];  Service: Gastroenterology;  Laterality: N/A;  . INTRAOPERATIVE TRANSESOPHAGEAL ECHOCARDIOGRAM N/A 04/18/2013   Procedure: INTRAOPERATIVE  TRANSESOPHAGEAL ECHOCARDIOGRAM;  Surgeon: Gaye Pollack,  MD;  Location: [facility] OR;  Service: Open Heart Surgery;  Laterality: N/A;    Social History   Socioeconomic History  . Marital status: Married    Spouse name: Not on file  . Number of children: Not on file  . Years of education: Not on file  . Highest education level: Not on file  Occupational History  . Occupation: not employed     Fish farm manager: [employer]  Tobacco Use  . Smoking status: Former Smoker    Types: Cigars    Quit date: 11/24/2019    Years since quitting: 0.0  . Smokeless tobacco: Never Used  . Tobacco comment: 1-2 cigars/day  Vaping Use  . Vaping Use: Never used  Substance and Sexual Activity  . Alcohol use: Yes    Comment: 5 bottles of wine/week/ Quit drinking about 5 months   . Drug use: No  . Sexual activity: Never  Other Topics Concern  . Not on file  Social History Narrative  . Not on file   Social Determinants of Health   Financial Resource Strain:   . Difficulty of Paying Living Expenses:   Food Insecurity:   . Worried About Charity fundraiser in the Last Year:   . Arboriculturist in the Last Year:   Transportation Needs:   . Film/video editor (Medical):   Marland Kitchen Lack of Transportation (Non-Medical):   Physical Activity:   . Days of Exercise per Week:   . Minutes of Exercise per Session:   Stress:   . Feeling of Stress :   Social Connections:   . Frequency of Communication with Friends and Family:   . Frequency of Social Gatherings with Friends and Family:   . Attends Religious Services:   . Active Member of Clubs or Organizations:   . Attends Archivist Meetings:   Marland Kitchen Marital Status:   Intimate Partner Violence:   . Fear of Current or Ex-Partner:   . Emotionally Abused:   Marland Kitchen Physically Abused:   . Sexually Abused:     Family History  Problem Relation Age of Onset  . Diabetes Mother      Current Outpatient Medications:  .  acetaminophen (TYLENOL) 325 MG tablet, Take 650 mg by mouth every 6 (six) hours as needed., Disp: , Rfl:   .  dexamethasone (DECADRON) 4 MG tablet, Take 1 tablet (4 mg total) by mouth 2 (two) times daily., Disp: 14 tablet, Rfl: 0 .  fentaNYL (DURAGESIC) 12 MCG/HR, Place 1 patch onto the skin every 3 (three) days., Disp: 5 patch, Rfl: 0 .  metoprolol tartrate (LOPRESSOR) 25 MG tablet, Take 25 mg by mouth daily., Disp: , Rfl:  .  ondansetron (ZOFRAN ODT) 8 MG disintegrating tablet, Take 1 tablet (8 mg total) by mouth every 6 (six) hours as needed for nausea or vomiting., Disp: 60 tablet, Rfl: 1 .  oxyCODONE (OXY IR/ROXICODONE) 5 MG immediate release tablet, Take 1 tablet (5 mg total) by mouth every 6 (six) hours as needed for severe pain., Disp: 30 tablet, Rfl: 0 .  pantoprazole (PROTONIX) 40 MG tablet, Take 1 tablet (40 mg total) by mouth 2 (two) times daily., Disp: 60 tablet, Rfl: 0 .  polyethylene glycol (MIRALAX) 17 g packet, Take 17 g by mouth daily as needed., Disp: 14 each, Rfl: 0 .  prochlorperazine (COMPAZINE) 10 MG tablet, Take 1 tablet (10 mg total) by mouth every 6 (six) hours as needed for  nausea or vomiting., Disp: 60 tablet, Rfl: 0 .  senna (SENOKOT) 8.6 MG TABS tablet, Take 2 tablets (17.2 mg total) by mouth in the morning and at bedtime., Disp: 120 tablet, Rfl: 0 .  Sennosides (SENNA) 8.8 MG/5ML SYRP, Take 5-10 mLs (8.8-17.6 mg total) by mouth 2 (two) times daily., Disp: 105 mL, Rfl: 0 .  traZODone (DESYREL) 50 MG tablet, Take 50 mg by mouth at bedtime., Disp: , Rfl:  .  Apixaban Starter Pack, 10mg  and 5mg , (ELIQUIS DVT/PE STARTER PACK), Take as directed on package: start with two-5mg  tablets twice daily for 7 days. On day 8, switch to one-5mg  tablet twice daily., Disp: 1 each, Rfl: 0 .  ferrous sulfate 325 (65 FE) MG tablet, Take 1 tablet (325 mg total) by mouth daily with breakfast. (Patient not taking: Reported on 12/09/2019), Disp: 30 tablet, Rfl: 0 .  furosemide (LASIX) 20 MG tablet, Take 1 tablet (20 mg total) by mouth daily as needed. For leg swelling., Disp: 30 tablet, Rfl: 0 .   lactulose (CHRONULAC) 10 GM/15ML solution, Take 15-30 mLs (10-20 g total) by mouth 2 (two) times daily as needed for mild constipation., Disp: 236 mL, Rfl: 0 .  magnesium chloride (SLOW-MAG) 64 MG TBEC SR tablet, Take 1 tablet (64 mg total) by mouth daily. (Patient not taking: Reported on 12/12/2019), Disp: 30 tablet, Rfl: 0 No current facility-administered medications for this visit.  Facility-Administered Medications Ordered in Other Visits:  .  sodium chloride flush (NS) 0.9 % injection 3 mL, 3 mL, Intravenous, Q12H, Arvil Chaco, PA-C  Physical exam:  Vitals:   12/12/19 0937  BP: 101/67  Pulse: 52  Resp: 16  Temp: (!) 95.3 F (35.2 C)   Physical Exam Constitutional:      General: He is not in acute distress.    Appearance: He is ill-appearing.     Comments: Cachectic patient sits in the wheelchair  HENT:     Head: Normocephalic and atraumatic.  Eyes:     General: No scleral icterus. Cardiovascular:     Rate and Rhythm: Normal rate and regular rhythm.     Heart sounds: Murmur heard.   Pulmonary:     Effort: Pulmonary effort is normal. No respiratory distress.     Breath sounds: Normal breath sounds. No wheezing.  Abdominal:     General: Bowel sounds are normal. There is distension.     Palpations: Abdomen is soft.  Musculoskeletal:        General: Swelling present. No deformity. Normal range of motion.     Cervical back: Normal range of motion and neck supple.     Comments: Bilateral pedal edema 2+  Skin:    General: Skin is warm and dry.     Findings: No erythema or rash.  Neurological:     Mental Status: He is alert and oriented to person, place, and time. Mental status is at baseline.     Cranial Nerves: No cranial nerve deficit.     Coordination: Coordination normal.  Psychiatric:        Mood and Affect: Mood normal.     CMP Latest Ref Rng & Units 12/12/2019  Glucose 70 - 99 mg/dL 117(H)  BUN 8 - 23 mg/dL 14  Creatinine 0.61 - 1.24 mg/dL 0.70  Sodium  135 - 145 mmol/L 134(L)  Potassium 3.5 - 5.1 mmol/L 4.1  Chloride 98 - 111 mmol/L 100  CO2 22 - 32 mmol/L 24  Calcium 8.9 - 10.3 mg/dL 8.4(L)  Total Protein 6.5 - 8.1 g/dL 5.9(L)  Total Bilirubin 0.3 - 1.2 mg/dL 0.6  Alkaline Phos 38 - 126 U/L 67  AST 15 - 41 U/L 28  ALT 0 - 44 U/L 29   CBC Latest Ref Rng & Units 12/12/2019  WBC 4.0 - 10.5 K/uL 10.9(H)  Hemoglobin 13.0 - 17.0 g/dL 11.1(L)  Hematocrit 39 - 52 % 33.0(L)  Platelets 150 - 400 K/uL 209    RADIOGRAPHIC STUDIES: I have personally reviewed the radiological images as listed and agreed with the findings in the report. DG Chest 2 View  Result Date: 11/26/2019 CLINICAL DATA:  Cough and weakness EXAM: CHEST - 2 VIEW COMPARISON:  Chest radiograph 10/19/2019 FINDINGS: Stable cardiomediastinal contours status post median sternotomy. Minimal atelectasis at the right lung base. No focal consolidation. On lateral view there is a 9 mm nodule inferiorly which is not well seen on the frontal projection. No pneumothorax or pleural effusion. No acute finding in the visualized skeleton. IMPRESSION: 1. No evidence of active disease in the chest. 2. 9 mm nodule inferiorly on the lateral view which is not well seen on the frontal projection. Consider nonemergent chest CT for further evaluation. Electronically Signed   By: Audie Pinto M.D.   On: 11/26/2019 17:12   US Abdomen Complete  Result Date: 12/05/2019 CLINICAL DATA:  Distended abdomen EXAM: ABDOMEN ULTRASOUND COMPLETE COMPARISON:  Ultrasound abdomen 10/19/2019 FINDINGS: Gallbladder: No gallstones or wall thickening visualized. No sonographic Murphy sign noted by sonographer. Common bile duct: Diameter: 3.8 mm Liver: Diffusely increased echogenicity liver parenchyma seen previously. No focal lesion. Portal vein is patent on color Doppler imaging with normal direction of blood flow towards the liver. IVC: No abnormality visualized. Pancreas: Visualized portion unremarkable. Spleen: Size and  appearance within normal limits. Right Kidney: Length: 10.6 cm. Echogenicity within normal limits. No mass or hydronephrosis visualized. Left Kidney: Length: 10.1 cm. Echogenicity within normal limits. No mass or hydronephrosis visualized. Abdominal aorta: No aneurysm visualized. Other findings: Moderate ascites.  Right pleural effusion. IMPRESSION: Echogenic liver suggesting fatty infiltration as noted previously Negative for gallstones Moderate ascites.  Right pleural effusion. Electronically Signed   By: Franchot Gallo M.D.   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 EXAM: NUCLEAR MEDICINE PET SKULL BASE TO THIGH TECHNIQUE: 8.21 mCi F-18 FDG was injected intravenously. Full-ring PET imaging was performed from the skull base to thigh after the radiotracer. CT data was obtained and used for attenuation correction and anatomic localization. Fasting blood glucose: 78 mg/dl COMPARISON:  None. FINDINGS: Mediastinal blood pool activity: SUV max 2.10 Liver activity: SUV max NA NECK: No hypermetabolic lymph nodes in the neck. Incidental CT findings: Extensive age advanced carotid artery calcifications. CHEST: 12 mm left paraesophageal lymph node on image 939/0 is hypermetabolic with SUV max of 3.00 and consistent with nodal metastasis. Moderate left and small right pleural effusions are noted. No hypermetabolic pleural nodules to suggest pleural metastatic disease. No worrisome pulmonary nodules on the lung windows to suggest pulmonary metastatic disease. No other hypermetabolic mediastinal or hilar lymph nodes. Incidental CT findings: Age advanced vascular calcifications and surgical changes from prior gastric bypass surgery. Extensive three-vessel coronary artery calcifications. ABDOMEN/PELVIS: There is a large irregular mass involving the gastric cardia just below the GE junction measuring  approximately 5.0 x 4.8 cm. There appears to be direct extension into the surrounding soft tissues with  a large nodal mass in the gastrohepatic ligament. Right-sided hepatic duodenum ligament adenopathy the measuring 2 cm with SUV max of 9.01. Mesenteric nodal mass adjacent to the SMA measures 3.5 cm on image 156/3. SUV max is 11.56. Right-sided retroperitoneal nodal mass measures 2.5 cm on image 165/3. SUV max is 11.86. No findings for hepatic metastatic disease. Moderate abdominal/pelvic ascites but no obvious omental or peritoneal surface lesions. Incidental CT findings: Significant age advanced vascular disease. SKELETON: No focal hypermetabolic activity to suggest skeletal metastasis. Incidental CT findings: none IMPRESSION: 1. 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 It appears to be directly infiltrating the surrounding soft tissues and there is bulky gastrohepatic ligament, paraduodenal, retroperitoneal and mesenteric adenopathy. There is also upward lymphatic disease with an enlarged and hypermetabolic mid level esophageal lymph node. 2. No findings for pulmonary metastatic disease or hepatic metastatic disease. 3. Bilateral pleural effusions and moderate volume abdominal/pelvic ascites. Electronically Signed   By: Marijo Sanes M.D.   On: 12/07/2019 15:49   US Venous Img Lower Bilateral  Result Date: 12/12/2019 CLINICAL DATA:  64 year old male with a history of DVT in leg swelling EXAM: BILATERAL LOWER EXTREMITY VENOUS DOPPLER ULTRASOUND TECHNIQUE: Gray-scale sonography with graded compression, as well as color Doppler and duplex ultrasound were performed to evaluate the lower extremity deep venous systems from the level of the common femoral vein and including the common femoral, femoral, profunda femoral, popliteal and calf veins including the posterior tibial, peroneal and gastrocnemius veins when visible. The superficial great saphenous vein was also interrogated.  Spectral Doppler was utilized to evaluate flow at rest and with distal augmentation maneuvers in the common femoral, femoral and popliteal veins. COMPARISON:  No comparison available FINDINGS: RIGHT LOWER EXTREMITY Common Femoral Vein: No evidence of thrombus. Normal compressibility, respiratory phasicity and response to augmentation. Saphenofemoral Junction: No evidence of thrombus. Normal compressibility and flow on color Doppler imaging. Profunda Femoral Vein: No evidence of thrombus. Normal compressibility and flow on color Doppler imaging. Femoral Vein: No evidence of thrombus. Normal compressibility, respiratory phasicity and response to augmentation. Popliteal Vein: No evidence of thrombus. Normal compressibility, respiratory phasicity and response to augmentation. Calf Veins: No evidence of thrombus. Normal compressibility and flow on color Doppler imaging. Superficial Great Saphenous Vein: No evidence of thrombus. Normal compressibility and flow on color Doppler imaging. Other Findings:  None. LEFT LOWER EXTREMITY Common Femoral Vein: No evidence of thrombus. Normal compressibility, respiratory phasicity and response to augmentation. Saphenofemoral Junction: No evidence of thrombus. Normal compressibility and flow on color Doppler imaging. Profunda Femoral Vein: No evidence of thrombus. Normal compressibility and flow on color Doppler imaging. Femoral Vein: No evidence of thrombus. Normal compressibility, respiratory phasicity and response to augmentation. Popliteal Vein: Nonocclusive thrombus with incomplete compressibility of the popliteal vein. Calf Veins: Posterior tibial vein and peroneal vein patent. Occlusive thrombus of the gastrocnemius vein Superficial Great Saphenous Vein: No evidence of thrombus. Normal compressibility and flow on color Doppler imaging. Other Findings:  None. IMPRESSION: Sonographic survey of the left lower extremity positive for DVT involving the gastrocnemius vein, with  extension into the popliteal vein. Popliteal thrombus is not nonocclusive on the current duplex. Sonographic survey of the right lower extremity negative for DVT Electronically Signed   By: Corrie Mckusick D.O.   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 EXAM: ULTRASOUND GUIDED PARACENTESIS MEDICATIONS: None. COMPLICATIONS: None immediate. PROCEDURE: Informed written consent was obtained from the patient  after a discussion of the risks, benefits and alternatives to treatment. A timeout was performed prior to the initiation of the procedure. Initial ultrasound was performed to localize ascites. The right lower abdomen was prepped and draped in the usual sterile fashion. 1% lidocaine was used for local anesthesia. Following this, a 6 Fr Safe-T-Centesis catheter was introduced. An ultrasound image was saved for documentation purposes. The paracentesis was performed. The catheter was removed and a dressing was applied. The patient tolerated the procedure well without immediate post procedural complication. FINDINGS: A total of approximately 2.2 L of cloudy, yellow fluid was removed. Samples were sent to the laboratory as requested by the clinical team. IMPRESSION: Successful ultrasound-guided paracentesis yielding 2.2 liters of peritoneal fluid. Electronically Signed   By: Aletta Edouard M.D.   On: 12/09/2019 16:34   US Paracentesis  Result Date: 12/06/2019 INDICATION: Patient with gastric cardia mass likely malignancy, new onset abdominal distension. Request to IR for diagnostic and therapeutic paracentesis. EXAM: ULTRASOUND GUIDED DIAGNOSTIC AND THERAPEUTIC PARACENTESIS MEDICATIONS: 10 mL 1% lidocaine COMPLICATIONS: None immediate. PROCEDURE: Informed written consent was obtained from the patient after a discussion of the risks, benefits and alternatives to treatment. A timeout was performed prior to the initiation of the procedure.  Initial ultrasound scanning demonstrates a moderate amount of ascites within the right lower abdominal quadrant. The right lower abdomen was prepped and draped in the usual sterile fashion. 1% lidocaine was used for local anesthesia. Following this, a 6 Fr Safe-T-Centesis catheter was introduced. An ultrasound image was saved for documentation purposes. The paracentesis was performed. The catheter was removed and a dressing was applied. The patient tolerated the procedure well without immediate post procedural complication. FINDINGS: A total of approximately 3.8 L of hazy yellow fluid was removed. Samples were sent to the laboratory as requested by the clinical team. IMPRESSION: Successful ultrasound-guided paracentesis yielding 3.8 liters of peritoneal fluid. Read by Candiss Norse, PA-C Electronically Signed   By: Sandi Mariscal M.D.   On: 12/06/2019 12:31   ECHOCARDIOGRAM COMPLETE  Result Date: 11/25/2019    ECHOCARDIOGRAM REPORT   Patient Name:   DOCTOR SHEAHAN Date of Exam: 11/25/2019 Medical Rec #:  [id number]      Height:       68.0 in Accession #:    [id number]     Weight:       129.9 lb Date of Birth:  1955/11/10      BSA:          1.701 m Patient Age:    70 years       BP:           143/81 mmHg Patient Gender: M              HR:           58 bpm. Exam Location:  [facility] Procedure: 2D Echo, Color Doppler and Cardiac Doppler Indications:     R01.2 Abnormal Heart Sounds Nec  History:         Patient has no prior history of Echocardiogram examinations.                  CAD; Prior CABG.  Sonographer:     Charmayne Sheer RDCS (AE) Referring Phys:  [id number] Arvil Chaco Diagnosing Phys: Ida Rogue MD IMPRESSIONS  1. Left ventricular ejection fraction, by estimation, is 60 to 65%. The left ventricle has normal function. The left ventricle has no regional wall motion abnormalities. Left ventricular diastolic parameters are  consistent with Grade II diastolic dysfunction (pseudonormalization).  2. Right  ventricular systolic function is normal. The right ventricular size is normal. There is mildly elevated pulmonary artery systolic pressure.  3. Left atrial size was mildly dilated.  4. The aortic valve is heavily calcified. Aortic valve regurgitation is mild. Severe aortic valve stenosis. Aortic valve area, by VTI measures 0.86 cm. Aortic valve mean gradient measures 38.0 mmHg. Aortic valve Vmax measures 4.02 m/s. FINDINGS  Left Ventricle: Left ventricular ejection fraction, by estimation, is 60 to 65%. The left ventricle has normal function. The left ventricle has no regional wall motion abnormalities. The left ventricular internal cavity size was normal in size. There is  no left ventricular hypertrophy. Left ventricular diastolic parameters are consistent with Grade II diastolic dysfunction (pseudonormalization). Right Ventricle: The right ventricular size is normal. No increase in right ventricular wall thickness. Right ventricular systolic function is normal. There is mildly elevated pulmonary artery systolic pressure. The tricuspid regurgitant velocity is 2.53  m/s, and with an assumed right atrial pressure of 10 mmHg, the estimated right ventricular systolic pressure is 77.8 mmHg. Left Atrium: Left atrial size was mildly dilated. Right Atrium: Right atrial size was normal in size. Pericardium: There is no evidence of pericardial effusion. Mitral Valve: The mitral valve is normal in structure. There is mild thickening of the mitral valve leaflet(s). Normal mobility of the mitral valve leaflets. Mild mitral valve regurgitation. No evidence of mitral valve stenosis. MV peak gradient, 4.2 mmHg. The mean mitral valve gradient is 2.0 mmHg. Tricuspid Valve: The tricuspid valve is normal in structure. Tricuspid valve regurgitation is not demonstrated. No evidence of tricuspid stenosis. Aortic Valve: The aortic valve is normal in structure. Aortic valve regurgitation is mild. Severe aortic stenosis is present. Aortic  valve mean gradient measures 38.0 mmHg. Aortic valve peak gradient measures 64.6 mmHg. Aortic valve area, by VTI measures  0.86 cm. Pulmonic Valve: The pulmonic valve was normal in structure. Pulmonic valve regurgitation is not visualized. No evidence of pulmonic stenosis. Aorta: The aortic root is normal in size and structure. Venous: The inferior vena cava is normal in size with greater than 50% respiratory variability, suggesting right atrial pressure of 3 mmHg. IAS/Shunts: No atrial level shunt detected by color flow Doppler.  LEFT VENTRICLE PLAX 2D LVIDd:         3.94 cm  Diastology LVIDs:         2.13 cm  LV e' lateral:   9.90 cm/s LV PW:         1.26 cm  LV E/e' lateral: 10.4 LV IVS:        1.13 cm  LV e' medial:    5.22 cm/s LVOT diam:     2.40 cm  LV E/e' medial:  19.7 LV SV:         89 LV SV Index:   52 LVOT Area:     4.52 cm  RIGHT VENTRICLE RV Basal diam:  3.15 cm LEFT ATRIUM             Index       RIGHT ATRIUM           Index LA diam:        4.20 cm 2.47 cm/m  RA Area:     12.70 cm LA Vol (A2C):   62.5 ml 36.74 ml/m RA Volume:   27.80 ml  16.34 ml/m LA Vol (A4C):   48.2 ml 28.33 ml/m LA Biplane Vol: 55.2 ml 32.45 ml/m  AORTIC VALVE                    PULMONIC VALVE AV Area (Vmax):    0.82 cm     PV Vmax:       2.01 m/s AV Area (Vmean):   0.95 cm     PV Vmean:      135.000 cm/s AV Area (VTI):     0.86 cm     PV VTI:        0.470 m AV Vmax:           402.00 cm/s  PV Peak grad:  16.2 mmHg AV Vmean:          247.500 cm/s PV Mean grad:  9.0 mmHg AV VTI:            1.040 m AV Peak Grad:      64.6 mmHg AV Mean Grad:      38.0 mmHg LVOT Vmax:         72.50 cm/s LVOT Vmean:        51.900 cm/s LVOT VTI:          0.197 m LVOT/AV VTI ratio: 0.19  AORTA Ao Root diam: 3.80 cm MITRAL VALVE                TRICUSPID VALVE MV Area (PHT): 4.04 cm     TR Peak grad:   25.6 mmHg MV Peak grad:  4.2 mmHg     TR Vmax:        253.00 cm/s MV Mean grad:  2.0 mmHg MV Vmax:       1.02 m/s     SHUNTS MV Vmean:      58.1  cm/s    Systemic VTI:  0.20 m MV Decel Time: 188 msec     Systemic Diam: 2.40 cm MV E velocity: 103.00 cm/s MV A velocity: 45.00 cm/s MV E/A ratio:  2.29 Ida Rogue MD Electronically signed by Ida Rogue MD Signature Date/Time: 11/25/2019/2:19:12 PM    Final      Assessment and plan 1. 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 Acute deep vein thrombosis (DVT) of calf muscle vein of left lower extremity (HCC)   3. Goals of care, counseling/discussion   4. Neoplasm related pain   5. Other ascites   6. Severe protein-calorie malnutrition ([place])   7. 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  His case was also discussed on tumor board. Findings are consistent with metastatic disease. Goals of care is with palliative intent.  Discussed.  Prognosis is poor given his poor performance status, malnutrition, other comorbidities and aggressive nature of cancer.  Continue follow-up with palliative care  I discussed with patient and daughter about palliative radiation with or without concurrent chemotherapy carbo and Taxol, followed by Systemic chemotherapy.  Daughter is concerned about patient's overall poor condition and want to proceed with palliative radiation and I think is very reasonable decision.  #Bilateral lower extremity edema, multifactorial. I will obtain stat bilateral lower extremity ultrasound to rule out DVT. -Ultrasound was positive for left lower extremity DVT involving gastrocnemius vein, extending to the popliteal vein. -Advised patient to start on anticoagulation with Eliquis 10 mg twice daily for 7 days followed by 5 mg twice daily. Rationale of anticoagulation and potential side effects were communicated  to daughter and patient.  #Nausea, symptoms are slightly improved.  Continue  antiemetics #Ascites, paracentesis cytology was negative for malignancy.  SAAG indicate potential transudate.  Likely due to malnutrition Patient third spacing.  Adding low-dose Lasix 20 mg daily as needed for leg edema/abdominal distention.  #Hypomagnesia, magnesium level normalized today. # Abdominal pain and back pain,  No longer relieved by tylenol. Discussed with narcotics.  Recommend fentanyl patch 42mcg Q72 h, and oxycodone 5mg  Q6 hours as needed.  #Malnutrition, follow-up with nutritionist.  Continue dexamethasone as appetite stimulant # constipation, not improved by Senokot and as needed MiraLAX.  Advised patient to utilize suppository and if still no improvement, recommend lactulose  #Left piriform sinus ulceration, ENT evaluation   Orders Placed This Encounter  Procedures  . US Venous Img Lower Bilateral    Patient can leave after scan per provider    Standing Status:   Future    Number of Occurrences:   1    Standing Expiration Date:   12/11/2020    Order Specific Question:   Reason for Exam (SYMPTOM  OR DIAGNOSIS REQUIRED)    Answer:   Rule out DVT, leg swelling    Order Specific Question:   Preferred imaging location?    Answer:   [facility]    Order Specific Question:   [place] Results- Best Contact Number?    Answer:   [phone]  . CBC with Differential/Platelet    Standing Status:   Future    Number of Occurrences:   1    Standing Expiration Date:   12/11/2020  . Comprehensive metabolic panel    Standing Status:   Future    Number of Occurrences:   1    Standing Expiration Date:   12/11/2020  . Magnesium    Standing Status:   Future    Number of Occurrences:   1    Standing Expiration Date:   12/11/2020  . CBC with Differential/Platelet    Standing Status:   Future    Standing Expiration Date:   12/11/2020  . Comprehensive metabolic panel    Standing Status:   Future    Standing Expiration Date:   12/11/2020  . Magnesium    Standing Status:   Future     Standing Expiration Date:   12/11/2020    Follow-up in 1 week.   Earlie Server, MD, PhD Hematology Oncology [facility] at [facility] Pager- [phone] 12/12/2019

## 2019-12-12 NOTE — Telephone Encounter (Signed)
Nutrition  Called daughter [place] to follow-up regarding nutritional status.  Left message on voicemail with [facility] back number.   Aleathea Pugmire B. Zenia Resides, [place], [place] Registered Dietitian [phone] (mobile)

## 2019-12-12 NOTE — Telephone Encounter (Signed)
Attempted to schedule no ans no vm  

## 2019-12-12 NOTE — Progress Notes (Signed)
[facility]  [phone] [phone]   Name: Ivor Kishi Azar Date: 12/12/2019 MRN: [id number]  DOB: Jan 03, 1956  Patient Care Team: Albina Billet, MD as PCP - General (Internal Medicine) Dionisio David, MD (Cardiology) Clent Jacks, RN as Oncology Nurse Navigator Earlie Server, MD as Consulting Physician (Oncology)    REASON FOR CONSULTATION: Vernon Patterson is a 64 y.o. male with multiple medical problems including CAD, severe aortic stenosis, depression/anxiety, who was hospitalized 11/23/2019-11/28/2019 with GI bleed.  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  Additionally, patient was found to have a left piriform sinus mass, which could reflect metastatic disease versus second primary.  Patient was found to have severe aortic stenosis but is not felt to be amenable to treatment at the present time given his ongoing issues with cancer.  He has had significant difficulty with eating and drinking with progressive weight loss.  Patient was referred to palliative care to help address goals and manage ongoing symptoms.  SOCIAL HISTORY:     reports that he quit smoking about 2 weeks ago. His smoking use included cigars. He has never used smokeless tobacco. He reports current alcohol use. He reports that he does not use drugs.  Patient is divorced.  He lives at home alone but his daughter from [place] is currently staying with him.  He has another daughter in [place].  Patient previously worked for [employer].  ADVANCE DIRECTIVES:  On file  CODE STATUS:   PAST MEDICAL HISTORY: Past Medical History:  Diagnosis Date   Anxiety    Coronary artery disease    Depression    Heart murmur    Hypertension    Nausea and vomiting 12/01/2019    PAST SURGICAL HISTORY:  Past Surgical History:  Procedure Laterality Date   CARDIAC  CATHETERIZATION     CORONARY ARTERY BYPASS GRAFT     CORONARY ARTERY BYPASS GRAFT N/A 04/18/2013   Procedure: CORONARY ARTERY BYPASS GRAFTING (CABG);  Surgeon: Gaye Pollack, MD;  Location: [facility];  Service: Open Heart Surgery;  Laterality: N/A;  Times  3  using left internal mammary artery and endoscopically harvested right saphenous vein   ENDOVEIN HARVEST OF GREATER SAPHENOUS VEIN Right 04/18/2013   Procedure: ENDOVEIN HARVEST OF GREATER SAPHENOUS VEIN;  Surgeon: Gaye Pollack, MD;  Location: [facility];  Service: Open Heart Surgery;  Laterality: Right;  Some of the saphenous vein from the lower leg also used.   ESOPHAGOGASTRODUODENOSCOPY (EGD) WITH PROPOFOL N/A 11/26/2019   Procedure: ESOPHAGOGASTRODUODENOSCOPY (EGD) WITH PROPOFOL;  Surgeon: Lin Landsman, MD;  Location: [facility];  Service: Gastroenterology;  Laterality: N/A;   INTRAOPERATIVE TRANSESOPHAGEAL ECHOCARDIOGRAM N/A 04/18/2013   Procedure: INTRAOPERATIVE TRANSESOPHAGEAL ECHOCARDIOGRAM;  Surgeon: Gaye Pollack, MD;  Location: [facility];  Service: Open Heart Surgery;  Laterality: N/A;    HEMATOLOGY/ONCOLOGY HISTORY:  Oncology History   No history exists.    ALLERGIES:  has No Known Allergies.  MEDICATIONS:  Current Outpatient Medications  Medication Sig Dispense Refill   acetaminophen (TYLENOL) 325 MG tablet Take 650 mg by mouth every 6 (six) hours as needed.     dexamethasone (DECADRON) 4 MG tablet Take 1 tablet (4 mg total) by mouth 2 (two) times daily. 14 tablet 0   fentaNYL (DURAGESIC) 12 MCG/HR Place 1 patch onto the skin every 3 (three) days. 5 patch  0   ferrous sulfate 325 (65 FE) MG tablet Take 1 tablet (325 mg total) by mouth daily with breakfast. (Patient not taking: Reported on 12/09/2019) 30 tablet 0   furosemide (LASIX) 20 MG tablet Take 1 tablet (20 mg total) by mouth daily as needed. For leg swelling. 30 tablet 0   magnesium chloride (SLOW-MAG) 64 MG TBEC SR tablet Take 1 tablet (64 mg total) by mouth  daily. (Patient not taking: Reported on 12/12/2019) 30 tablet 0   metoprolol tartrate (LOPRESSOR) 25 MG tablet Take 25 mg by mouth daily.     ondansetron (ZOFRAN ODT) 8 MG disintegrating tablet Take 1 tablet (8 mg total) by mouth every 6 (six) hours as needed for nausea or vomiting. 60 tablet 1   oxyCODONE (OXY IR/ROXICODONE) 5 MG immediate release tablet Take 1 tablet (5 mg total) by mouth every 6 (six) hours as needed for severe pain. 30 tablet 0   pantoprazole (PROTONIX) 40 MG tablet Take 1 tablet (40 mg total) by mouth 2 (two) times daily. 60 tablet 0   polyethylene glycol (MIRALAX) 17 g packet Take 17 g by mouth daily as needed. 14 each 0   prochlorperazine (COMPAZINE) 10 MG tablet Take 1 tablet (10 mg total) by mouth every 6 (six) hours as needed for nausea or vomiting. 60 tablet 0   senna (SENOKOT) 8.6 MG TABS tablet Take 2 tablets (17.2 mg total) by mouth in the morning and at bedtime. 120 tablet 0   Sennosides (SENNA) 8.8 MG/5ML SYRP Take 5-10 mLs (8.8-17.6 mg total) by mouth 2 (two) times daily. 105 mL 0   traZODone (DESYREL) 50 MG tablet Take 50 mg by mouth at bedtime.     No current facility-administered medications for this visit.   Facility-Administered Medications Ordered in Other Visits  Medication Dose Route Frequency Provider Last Rate Last Admin   sodium chloride flush (NS) 0.9 % injection 3 mL  3 mL Intravenous Q12H Visser, Jacquelyn D, PA-C        VITAL SIGNS: There were no vitals taken for this visit. There were no vitals filed for this visit.  Estimated body mass index is 22.43 kg/m as calculated from the following:   Height as of 11/26/19: 5\' 8"  (1.727 m).   Weight as of 12/07/19: 147 lb 8 oz (66.9 kg).  LABS: CBC:    Component Value Date/Time   WBC 10.9 (H) 12/12/2019 0911   HGB 11.1 (L) 12/12/2019 0911   HGB 14.6 04/18/2013 0454   HCT 33.0 (L) 12/12/2019 0911   HCT 42.9 04/16/2013 0419   PLT 209 12/12/2019 0911   PLT 117 (L) 04/18/2013 0454   MCV  93.8 12/12/2019 0911   MCV 101 (H) 04/16/2013 0419   NEUTROABS 9.1 (H) 12/12/2019 0911   LYMPHSABS 1.1 12/12/2019 0911   MONOABS 0.7 12/12/2019 0911   EOSABS 0.0 12/12/2019 0911   BASOSABS 0.0 12/12/2019 0911   Comprehensive Metabolic Panel:    Component Value Date/Time   NA 134 (L) 12/12/2019 0911   NA 137 04/16/2013 0419   K 4.1 12/12/2019 0911   K 3.8 04/16/2013 0419   CL 100 12/12/2019 0911   CL 105 04/16/2013 0419   CO2 24 12/12/2019 0911   CO2 23 04/16/2013 0419   BUN 14 12/12/2019 0911   BUN 9 04/16/2013 0419   CREATININE 0.70 12/12/2019 0911   CREATININE 0.74 04/16/2013 0419   GLUCOSE 117 (H) 12/12/2019 0911   GLUCOSE 98 04/16/2013 0419   CALCIUM 8.4 (L)  12/12/2019 0911   CALCIUM 8.8 04/16/2013 0419   AST 28 12/12/2019 0911   AST 161 (H) 04/16/2013 0419   ALT 29 12/12/2019 0911   ALT 173 (H) 04/16/2013 0419   ALKPHOS 67 12/12/2019 0911   ALKPHOS 64 04/16/2013 0419   BILITOT 0.6 12/12/2019 0911   BILITOT 0.5 04/16/2013 0419   PROT 5.9 (L) 12/12/2019 0911   PROT 7.2 04/16/2013 0419   ALBUMIN 2.8 (L) 12/12/2019 0911   ALBUMIN 3.5 04/16/2013 0419    RADIOGRAPHIC STUDIES: DG Chest 2 View  Result Date: 11/26/2019 CLINICAL DATA:  Cough and weakness EXAM: CHEST - 2 VIEW COMPARISON:  Chest radiograph 10/19/2019 FINDINGS: Stable cardiomediastinal contours status post median sternotomy. Minimal atelectasis at the right lung base. No focal consolidation. On lateral view there is a 9 mm nodule inferiorly which is not well seen on the frontal projection. No pneumothorax or pleural effusion. No acute finding in the visualized skeleton. IMPRESSION: 1. No evidence of active disease in the chest. 2. 9 mm nodule inferiorly on the lateral view which is not well seen on the frontal projection. Consider nonemergent chest CT for further evaluation. Electronically Signed   By: Audie Pinto M.D.   On: 11/26/2019 17:12   US Abdomen Complete  Result Date: 12/05/2019 CLINICAL DATA:   Distended abdomen EXAM: ABDOMEN ULTRASOUND COMPLETE COMPARISON:  Ultrasound abdomen 10/19/2019 FINDINGS: Gallbladder: No gallstones or wall thickening visualized. No sonographic Murphy sign noted by sonographer. Common bile duct: Diameter: 3.8 mm Liver: Diffusely increased echogenicity liver parenchyma seen previously. No focal lesion. Portal vein is patent on color Doppler imaging with normal direction of blood flow towards the liver. IVC: No abnormality visualized. Pancreas: Visualized portion unremarkable. Spleen: Size and appearance within normal limits. Right Kidney: Length: 10.6 cm. Echogenicity within normal limits. No mass or hydronephrosis visualized. Left Kidney: Length: 10.1 cm. Echogenicity within normal limits. No mass or hydronephrosis visualized. Abdominal aorta: No aneurysm visualized. Other findings: Moderate ascites.  Right pleural effusion. IMPRESSION: Echogenic liver suggesting fatty infiltration as noted previously Negative for gallstones Moderate ascites.  Right pleural effusion. Electronically Signed   By: Franchot Gallo M.D.   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 EXAM: NUCLEAR MEDICINE PET SKULL BASE TO THIGH TECHNIQUE: 8.21 mCi F-18 FDG was injected intravenously. Full-ring PET imaging was performed from the skull base to thigh after the radiotracer. CT data was obtained and used for attenuation correction and anatomic localization. Fasting blood glucose: 78 mg/dl COMPARISON:  None. FINDINGS: Mediastinal blood pool activity: SUV max 2.10 Liver activity: SUV max NA NECK: No hypermetabolic lymph nodes in the neck. Incidental CT findings: Extensive age advanced carotid artery calcifications. CHEST: 12 mm left paraesophageal lymph node on image 638/4 is hypermetabolic with SUV max of 6.65 and consistent with nodal metastasis. Moderate left and small right pleural  effusions are noted. No hypermetabolic pleural nodules to suggest pleural metastatic disease. No worrisome pulmonary nodules on the lung windows to suggest pulmonary metastatic disease. No other hypermetabolic mediastinal or hilar lymph nodes. Incidental CT findings: Age advanced vascular calcifications and surgical changes from prior gastric bypass surgery. Extensive three-vessel coronary artery calcifications. ABDOMEN/PELVIS: There is a large irregular mass involving the gastric cardia just below the GE junction measuring approximately 5.0 x 4.8 cm. There appears to be direct extension into the surrounding soft tissues with a large nodal mass in the gastrohepatic  ligament. Right-sided hepatic duodenum ligament adenopathy the measuring 2 cm with SUV max of 9.01. Mesenteric nodal mass adjacent to the SMA measures 3.5 cm on image 156/3. SUV max is 11.56. Right-sided retroperitoneal nodal mass measures 2.5 cm on image 165/3. SUV max is 11.86. No findings for hepatic metastatic disease. Moderate abdominal/pelvic ascites but no obvious omental or peritoneal surface lesions. Incidental CT findings: Significant age advanced vascular disease. SKELETON: No focal hypermetabolic activity to suggest skeletal metastasis. Incidental CT findings: none IMPRESSION: 1. 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 It appears to be directly infiltrating the surrounding soft tissues and there is bulky gastrohepatic ligament, paraduodenal, retroperitoneal and mesenteric adenopathy. There is also upward lymphatic disease with an enlarged and hypermetabolic mid level esophageal lymph node. 2. No findings for pulmonary metastatic disease or hepatic metastatic disease. 3. Bilateral pleural effusions and moderate volume abdominal/pelvic ascites. Electronically Signed   By: Marijo Sanes M.D.   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  EXAM: ULTRASOUND GUIDED PARACENTESIS MEDICATIONS: None. COMPLICATIONS: None immediate. PROCEDURE: Informed written consent was obtained from the patient after a discussion of the risks, benefits and alternatives to treatment. A timeout was performed prior to the initiation of the procedure. Initial ultrasound was performed to localize ascites. The right lower abdomen was prepped and draped in the usual sterile fashion. 1% lidocaine was used for local anesthesia. Following this, a 6 Fr Safe-T-Centesis catheter was introduced. An ultrasound image was saved for documentation purposes. The paracentesis was performed. The catheter was removed and a dressing was applied. The patient tolerated the procedure well without immediate post procedural complication. FINDINGS: A total of approximately 2.2 L of cloudy, yellow fluid was removed. Samples were sent to the laboratory as requested by the clinical team. IMPRESSION: Successful ultrasound-guided paracentesis yielding 2.2 liters of peritoneal fluid. Electronically Signed   By: Aletta Edouard M.D.   On: 12/09/2019 16:34   US Paracentesis  Result Date: 12/06/2019 INDICATION: Patient with gastric cardia mass likely malignancy, new onset abdominal distension. Request to IR for diagnostic and therapeutic paracentesis. EXAM: ULTRASOUND GUIDED DIAGNOSTIC AND THERAPEUTIC PARACENTESIS MEDICATIONS: 10 mL 1% lidocaine COMPLICATIONS: None immediate. PROCEDURE: Informed written consent was obtained from the patient after a discussion of the risks, benefits and alternatives to treatment. A timeout was performed prior to the initiation of the procedure. Initial ultrasound scanning demonstrates a moderate amount of ascites within the right lower abdominal quadrant. The right lower abdomen was prepped and draped in the usual sterile fashion. 1% lidocaine was used for local anesthesia. Following this, a 6 Fr Safe-T-Centesis catheter was introduced. An ultrasound image was saved for  documentation purposes. The paracentesis was performed. The catheter was removed and a dressing was applied. The patient tolerated the procedure well without immediate post procedural complication. FINDINGS: A total of approximately 3.8 L of hazy yellow fluid was removed. Samples were sent to the laboratory as requested by the clinical team. IMPRESSION: Successful ultrasound-guided paracentesis yielding 3.8 liters of peritoneal fluid. Read by Candiss Norse, PA-C Electronically Signed   By: Sandi Mariscal M.D.   On: 12/06/2019 12:31   ECHOCARDIOGRAM COMPLETE  Result Date: 11/25/2019    ECHOCARDIOGRAM REPORT   Patient Name:   NAKOTA ELSEN Date of Exam: 11/25/2019 Medical Rec #:  [id number]      Height:       68.0 in Accession #:    [id number]  Weight:       129.9 lb Date of Birth:  [birth year]      BSA:          1.701 m Patient Age:    50 years       BP:           143/81 mmHg Patient Gender: M              HR:           58 bpm. Exam Location:  [facility] Procedure: 2D Echo, Color Doppler and Cardiac Doppler Indications:     R01.2 Abnormal Heart Sounds Nec  History:         Patient has no prior history of Echocardiogram examinations.                  CAD; Prior CABG.  Sonographer:     Charmayne Sheer RDCS (AE) Referring Phys:  [id number] Arvil Chaco Diagnosing Phys: Ida Rogue MD IMPRESSIONS  1. Left ventricular ejection fraction, by estimation, is 60 to 65%. The left ventricle has normal function. The left ventricle has no regional wall motion abnormalities. Left ventricular diastolic parameters are consistent with Grade II diastolic dysfunction (pseudonormalization).  2. Right ventricular systolic function is normal. The right ventricular size is normal. There is mildly elevated pulmonary artery systolic pressure.  3. Left atrial size was mildly dilated.  4. The aortic valve is heavily calcified. Aortic valve regurgitation is mild. Severe aortic valve stenosis. Aortic valve area, by VTI measures 0.86 cm. Aortic  valve mean gradient measures 38.0 mmHg. Aortic valve Vmax measures 4.02 m/s. FINDINGS  Left Ventricle: Left ventricular ejection fraction, by estimation, is 60 to 65%. The left ventricle has normal function. The left ventricle has no regional wall motion abnormalities. The left ventricular internal cavity size was normal in size. There is  no left ventricular hypertrophy. Left ventricular diastolic parameters are consistent with Grade II diastolic dysfunction (pseudonormalization). Right Ventricle: The right ventricular size is normal. No increase in right ventricular wall thickness. Right ventricular systolic function is normal. There is mildly elevated pulmonary artery systolic pressure. The tricuspid regurgitant velocity is 2.53  m/s, and with an assumed right atrial pressure of 10 mmHg, the estimated right ventricular systolic pressure is 58.0 mmHg. Left Atrium: Left atrial size was mildly dilated. Right Atrium: Right atrial size was normal in size. Pericardium: There is no evidence of pericardial effusion. Mitral Valve: The mitral valve is normal in structure. There is mild thickening of the mitral valve leaflet(s). Normal mobility of the mitral valve leaflets. Mild mitral valve regurgitation. No evidence of mitral valve stenosis. MV peak gradient, 4.2 mmHg. The mean mitral valve gradient is 2.0 mmHg. Tricuspid Valve: The tricuspid valve is normal in structure. Tricuspid valve regurgitation is not demonstrated. No evidence of tricuspid stenosis. Aortic Valve: The aortic valve is normal in structure. Aortic valve regurgitation is mild. Severe aortic stenosis is present. Aortic valve mean gradient measures 38.0 mmHg. Aortic valve peak gradient measures 64.6 mmHg. Aortic valve area, by VTI measures  0.86 cm. Pulmonic Valve: The pulmonic valve was normal in structure. Pulmonic valve regurgitation is not visualized. No evidence of pulmonic stenosis. Aorta: The aortic root is normal in size and structure. Venous:  The inferior vena cava is normal in size with greater than 50% respiratory variability, suggesting right atrial pressure of 3 mmHg. IAS/Shunts: No atrial level shunt detected by color flow Doppler.  LEFT VENTRICLE PLAX 2D LVIDd:  3.94 cm  Diastology LVIDs:         2.13 cm  LV e' lateral:   9.90 cm/s LV PW:         1.26 cm  LV E/e' lateral: 10.4 LV IVS:        1.13 cm  LV e' medial:    5.22 cm/s LVOT diam:     2.40 cm  LV E/e' medial:  19.7 LV SV:         89 LV SV Index:   52 LVOT Area:     4.52 cm  RIGHT VENTRICLE RV Basal diam:  3.15 cm LEFT ATRIUM             Index       RIGHT ATRIUM           Index LA diam:        4.20 cm 2.47 cm/m  RA Area:     12.70 cm LA Vol (A2C):   62.5 ml 36.74 ml/m RA Volume:   27.80 ml  16.34 ml/m LA Vol (A4C):   48.2 ml 28.33 ml/m LA Biplane Vol: 55.2 ml 32.45 ml/m  AORTIC VALVE                    PULMONIC VALVE AV Area (Vmax):    0.82 cm     PV Vmax:       2.01 m/s AV Area (Vmean):   0.95 cm     PV Vmean:      135.000 cm/s AV Area (VTI):     0.86 cm     PV VTI:        0.470 m AV Vmax:           402.00 cm/s  PV Peak grad:  16.2 mmHg AV Vmean:          247.500 cm/s PV Mean grad:  9.0 mmHg AV VTI:            1.040 m AV Peak Grad:      64.6 mmHg AV Mean Grad:      38.0 mmHg LVOT Vmax:         72.50 cm/s LVOT Vmean:        51.900 cm/s LVOT VTI:          0.197 m LVOT/AV VTI ratio: 0.19  AORTA Ao Root diam: 3.80 cm MITRAL VALVE                TRICUSPID VALVE MV Area (PHT): 4.04 cm     TR Peak grad:   25.6 mmHg MV Peak grad:  4.2 mmHg     TR Vmax:        253.00 cm/s MV Mean grad:  2.0 mmHg MV Vmax:       1.02 m/s     SHUNTS MV Vmean:      58.1 cm/s    Systemic VTI:  0.20 m MV Decel Time: 188 msec     Systemic Diam: 2.40 cm MV E velocity: 103.00 cm/s MV A velocity: 45.00 cm/s MV E/A ratio:  2.29 Ida Rogue MD Electronically signed by Ida Rogue MD Signature Date/Time: 11/25/2019/2:19:12 PM    Final     PERFORMANCE STATUS (ECOG) : 3 - Symptomatic, >50% confined to  bed  Review of Systems Unless otherwise noted, a complete review of systems is negative.  Physical Exam General: NAD Pulmonary: Unlabored Extremities: no edema, no joint deformities Skin: no rashes Neurological: Weakness but otherwise nonfocal  IMPRESSION: Patient had recent paracentesis last week with 2.2 L removed.  High probability of rapid reaccumulation.  Have discussed option of abdominal Pleurx.  Overall, patient reports that he is doing better today.  He thinks his oral intake has improved.  Daughter agrees.  Patient's pain is also better and is only requiring oxycodone once daily.  Unfortunately, patient remains constipated.  Constipation is unrelieved with use of daily senna and MiraLAX.  He has not yet tried Dulcolax suppositories.  Will trial lactulose.  If that fails, could consider peripheral opioid receptor antagonist.  Daughter had some concerns about in-home care when she has to go home (lives 7 hours away).  Patient has been somewhat resistant to the idea of getting help in the home but was in agreement today for referral to palliative care social worker.  Daughter says that she plans to review with patient decision making and the MOST form.  PLAN: -Continue current scope of treatment -Continue oxycodone as needed -Lactulose 15 to 30 mL twice daily as needed -Referral to home-based palliative care -As needed paracentesis -MOST form previously reviewed -RTC in 1 week  Case and plan discussed with Dr. Tasia Catchings  Patient expressed understanding and was in agreement with this plan. He also understands that He can [place] the clinic at any time with any questions, concerns, or complaints.     Time Total: 15 minutes  Visit consisted of counseling and education dealing with the complex and emotionally intense issues of symptom management and palliative care in the setting of serious and potentially life-threatening illness.Greater than 50%  of this time was spent counseling and  coordinating care related to the above assessment and plan.  Signed by: Altha Harm, PhD, NP-C

## 2019-12-12 NOTE — Progress Notes (Signed)
Patient reports that he is feeling weak today but better than when last in the office on Friday.  Has LE edema and they have noticed some weeping.

## 2019-12-13 ENCOUNTER — Telehealth: Payer: Self-pay | Admitting: *Deleted

## 2019-12-13 ENCOUNTER — Other Ambulatory Visit: Payer: Self-pay

## 2019-12-13 ENCOUNTER — Telehealth: Payer: Self-pay

## 2019-12-13 DIAGNOSIS — C155 Malignant neoplasm of lower third of esophagus: Secondary | ICD-10-CM | POA: Diagnosis not present

## 2019-12-13 NOTE — Telephone Encounter (Signed)
Phone Ayoub placed to daughter, Miquel Dunn. VM left with [place] back information.

## 2019-12-13 NOTE — Telephone Encounter (Signed)
Vernon Patterson, triage nurse, spoke to patient's daughter again and she is concerned with patient feeling so weak that she had to help him off the toilet.  He is having pain in his back, chest/esophagus area, and left leg.  He had 1 episode of vomiting and she gave him the nausea medication.  Vernon Patterson informed her that it is to late today for him to be evaluated in the clinic but offered him an appointment with [facility] tomorrow.  If she feels that he needs to be seen today then they should go to ER.  Vernon Patterson agreed that if he doesn't improve through out the evening or night then she will take him to ER.  Checking with [facility] about an appt tomorrow.

## 2019-12-13 NOTE — Telephone Encounter (Signed)
Received a [place] from [facility], who identified herself as a friend of patient. She shared that she works at [employer] in [place] and wanted to share that patient was given a poor prognosis. Discussed Hospice services vs Palliative care services. Will reach out to patient and daughter.

## 2019-12-13 NOTE — Telephone Encounter (Signed)
Daughter Vernon Patterson called reporting that patient has been in bed all day except to go to the bathroom and is saying that he feels like this is the end for him and all he wants to do is stay in his bed. She is asking where to go from here. Please return her Fair

## 2019-12-13 NOTE — Telephone Encounter (Signed)
RN called pt's daughter, Vernon Patterson, to give her an appt for symptom management tomorrow at 9am. She states her dad is in a whole lot of pain. Some one told her if his pain gets out of control to bring him to ER. She doesn't want to have to do that. He woke up an hour ago and vomited. She did give him 8mg  zofran. She gave him oxycodone 1 5mg  tablet for pain which doesn't seem to be helping very much. He does have a new 12 microgram Duragesic patch on. I did tell her she could give him an extra oxycodone tab to see if that helps. She could also try giving him compazine for nausea if zofran not working. I told her we would make adjustments to his pain med regime in the morning.

## 2019-12-13 NOTE — Telephone Encounter (Signed)
Daughter returned RN navigator V[place] to  schedule palliative consult and ask about palliative care services. Reviewed hospice vs. Palliative services. Pt continues to plan on treatment at this point. Visit scheduled for NP consult Thursday 8/5 at 1:30 pm

## 2019-12-13 NOTE — Progress Notes (Signed)
error 

## 2019-12-14 ENCOUNTER — Telehealth: Payer: Self-pay

## 2019-12-14 ENCOUNTER — Other Ambulatory Visit: Payer: Self-pay | Admitting: Internal Medicine

## 2019-12-14 ENCOUNTER — Inpatient Hospital Stay (HOSPITAL_BASED_OUTPATIENT_CLINIC_OR_DEPARTMENT_OTHER): Payer: Medicare Other | Admitting: Hospice and Palliative Medicine

## 2019-12-14 ENCOUNTER — Other Ambulatory Visit: Payer: Self-pay | Admitting: Radiology

## 2019-12-14 ENCOUNTER — Inpatient Hospital Stay (HOSPITAL_BASED_OUTPATIENT_CLINIC_OR_DEPARTMENT_OTHER): Payer: Medicare Other | Admitting: Oncology

## 2019-12-14 ENCOUNTER — Ambulatory Visit: Payer: Medicare Other

## 2019-12-14 ENCOUNTER — Other Ambulatory Visit: Payer: Self-pay

## 2019-12-14 ENCOUNTER — Inpatient Hospital Stay: Payer: Medicare Other

## 2019-12-14 VITALS — BP 115/81 | HR 46 | Resp 16 | Wt 143.0 lb

## 2019-12-14 VITALS — BP 126/66 | HR 50 | Temp 96.7°F | Resp 17

## 2019-12-14 DIAGNOSIS — C160 Malignant neoplasm of cardia: Secondary | ICD-10-CM

## 2019-12-14 DIAGNOSIS — R188 Other ascites: Secondary | ICD-10-CM | POA: Diagnosis not present

## 2019-12-14 DIAGNOSIS — R112 Nausea with vomiting, unspecified: Secondary | ICD-10-CM

## 2019-12-14 DIAGNOSIS — Z515 Encounter for palliative care: Secondary | ICD-10-CM | POA: Diagnosis not present

## 2019-12-14 DIAGNOSIS — Z7189 Other specified counseling: Secondary | ICD-10-CM | POA: Diagnosis not present

## 2019-12-14 DIAGNOSIS — G893 Neoplasm related pain (acute) (chronic): Secondary | ICD-10-CM

## 2019-12-14 LAB — CBC WITH DIFFERENTIAL/PLATELET
Abs Immature Granulocytes: 0.06 10*3/uL (ref 0.00–0.07)
Basophils Absolute: 0 10*3/uL (ref 0.0–0.1)
Basophils Relative: 0 %
Eosinophils Absolute: 0 10*3/uL (ref 0.0–0.5)
Eosinophils Relative: 0 %
HCT: 30.7 % — ABNORMAL LOW (ref 39.0–52.0)
Hemoglobin: 10.4 g/dL — ABNORMAL LOW (ref 13.0–17.0)
Immature Granulocytes: 1 %
Lymphocytes Relative: 10 %
Lymphs Abs: 1 10*3/uL (ref 0.7–4.0)
MCH: 31.7 pg (ref 26.0–34.0)
MCHC: 33.9 g/dL (ref 30.0–36.0)
MCV: 93.6 fL (ref 80.0–100.0)
Monocytes Absolute: 0.9 10*3/uL (ref 0.1–1.0)
Monocytes Relative: 8 %
Neutro Abs: 8.8 10*3/uL — ABNORMAL HIGH (ref 1.7–7.7)
Neutrophils Relative %: 81 %
Platelets: 185 10*3/uL (ref 150–400)
RBC: 3.28 MIL/uL — ABNORMAL LOW (ref 4.22–5.81)
RDW: 15.5 % (ref 11.5–15.5)
WBC: 10.7 10*3/uL — ABNORMAL HIGH (ref 4.0–10.5)
nRBC: 0 % (ref 0.0–0.2)

## 2019-12-14 LAB — COMPREHENSIVE METABOLIC PANEL
ALT: 24 U/L (ref 0–44)
AST: 23 U/L (ref 15–41)
Albumin: 2.4 g/dL — ABNORMAL LOW (ref 3.5–5.0)
Alkaline Phosphatase: 62 U/L (ref 38–126)
Anion gap: 7 (ref 5–15)
BUN: 13 mg/dL (ref 8–23)
CO2: 25 mmol/L (ref 22–32)
Calcium: 7.8 mg/dL — ABNORMAL LOW (ref 8.9–10.3)
Chloride: 101 mmol/L (ref 98–111)
Creatinine, Ser: 0.68 mg/dL (ref 0.61–1.24)
GFR calc Af Amer: >60 mL/min (ref >60)
GFR calc non Af Amer: >60 mL/min (ref >60)
Glucose, Bld: 151 mg/dL — ABNORMAL HIGH (ref 70–99)
Potassium: 4.1 mmol/L (ref 3.5–5.1)
Sodium: 133 mmol/L — ABNORMAL LOW (ref 135–145)
Total Bilirubin: 1 mg/dL (ref 0.3–1.2)
Total Protein: 5.3 g/dL — ABNORMAL LOW (ref 6.5–8.1)

## 2019-12-14 LAB — MAGNESIUM: Magnesium: 1.8 mg/dL (ref 1.7–2.4)

## 2019-12-14 LAB — CYTOLOGY - NON PAP

## 2019-12-14 MED ORDER — SODIUM CHLORIDE 0.9 % IV SOLN: Freq: Once | INTRAVENOUS | Status: DC

## 2019-12-14 MED ORDER — MORPHINE SULFATE 2 MG/ML IJ SOLN
2.0000 mg | Freq: Once | INTRAMUSCULAR | Status: DC
  Filled 2019-12-14: qty 1

## 2019-12-14 MED ORDER — SODIUM CHLORIDE 0.9 % IV SOLN
INTRAVENOUS | Status: DC
  Filled 2019-12-14 (×2): qty 250

## 2019-12-14 MED ORDER — MORPHINE SULFATE (PF) 2 MG/ML IV SOLN
INTRAVENOUS | Status: AC
  Filled 2019-12-14: qty 1

## 2019-12-14 MED ORDER — DEXAMETHASONE SODIUM PHOSPHATE 10 MG/ML IJ SOLN
10.0000 mg | Freq: Once | INTRAMUSCULAR | Status: AC
  Administered 2019-12-14: 10 mg via INTRAVENOUS

## 2019-12-14 MED ORDER — SODIUM CHLORIDE 0.9 % IV SOLN
10.0000 mg | Freq: Once | INTRAVENOUS | Status: DC
  Filled 2019-12-14: qty 1

## 2019-12-14 MED ORDER — ONDANSETRON HCL 4 MG/2ML IJ SOLN
8.0000 mg | Freq: Once | INTRAMUSCULAR | Status: AC
  Administered 2019-12-14: 8 mg via INTRAVENOUS
  Filled 2019-12-14: qty 4

## 2019-12-14 MED ORDER — MORPHINE SULFATE (CONCENTRATE) 10 MG /0.5 ML PO SOLN: 5.0000 mg | ORAL | 0 refills | Status: DC | PRN

## 2019-12-14 MED ORDER — MORPHINE SULFATE (PF) 2 MG/ML IV SOLN
2.0000 mg | Freq: Once | INTRAVENOUS | Status: AC
  Administered 2019-12-14: 2 mg via INTRAVENOUS

## 2019-12-14 NOTE — Progress Notes (Signed)
Symptom Management Consult note [facility]  Telephone:([phone] [phone]  Patient Care Team: Albina Billet, MD as PCP - General (Internal Medicine) Dionisio David, MD (Cardiology) Clent Jacks, RN as Oncology Nurse Navigator Earlie Server, MD as Consulting Physician (Oncology)   Name of the patient: Vernon Patterson  [id number]  03-Sep-1955   Date of visit: 12/14/2019   Diagnosis- Lung Cancer   Chief complaint/ Reason for visit-Pain control/ Iv fluids  Heme/Onc history: Mr. Landry Mellow is a 64 year old male with multiple medical problems including CAD, severe aortic stenosis, depression, anxiety who was recently hospitalized from 94 21-7 1921 with GI bleed.  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  Additionally patient was found to have a left piriform sinus mass which could reflect metastatic disease for second primary.  Patient was found to have severe aortic stenosis but is not felt to be amenable to treatment at the present time given his ongoing issues with cancer.  He has had significant difficulty with eating and drinking with progressive weight loss.  He has been referred to radiation oncology and will begin radiation next week.  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   Interval history-he was seen in clinic by Dr. Tasia Catchings and Billey Chang on Monday.  At that time, he was felt improved.  Plan is for patient to have IV fluids, IV steroids and additional support every Monday Wednesday and Friday until he begins radiation.  Patient and daughter have decided against chemotherapy at this time.  Today, he presents to symptom management for follow-up.  He did not receive IV fluids on Monday.  Unfortunately, yesterday he developed significant epigastric and thoracic back pain unrelieved with oxycodone.  Patient's daughter states she gave  him an additional oxycodone along with Zofran and Decadron.  This relieved some of his discomfort enough for him to rest last night.  He is having significant progressive dysphagia and is unable to swallow most of his pills.  He has very little appetite and is eating small meals daily.  He has rapidly declined in the past 24 hours.  His daughter is having to help with all of his ADLs.  ECOG FS:2 - Symptomatic, <50% confined to bed  Review of systems- Review of Systems  Constitutional: Positive for malaise/fatigue and weight loss.  HENT:       Dysphagia  Gastrointestinal: Positive for abdominal pain.  Neurological: Positive for weakness.     Current treatment-ending radiation next week  No Known Allergies   Past Medical History:  Diagnosis Date  . Anxiety   . Coronary artery disease   . Depression   . Heart murmur   . Hypertension   . Nausea and vomiting 12/01/2019     Past Surgical History:  Procedure Laterality Date  . CARDIAC CATHETERIZATION    . CORONARY ARTERY BYPASS GRAFT    . CORONARY ARTERY BYPASS GRAFT N/A 04/18/2013   Procedure: CORONARY ARTERY BYPASS GRAFTING (CABG);  Surgeon: Gaye Pollack, MD;  Location: [facility];  Service: Open Heart Surgery;  Laterality: N/A;  Times  3  using left internal mammary artery and endoscopically harvested right saphenous vein  . ENDOVEIN HARVEST OF GREATER SAPHENOUS VEIN Right 04/18/2013   Procedure: ENDOVEIN HARVEST OF GREATER SAPHENOUS VEIN;  Surgeon: Gaye Pollack, MD;  Location: [facility];  Service: Open Heart Surgery;  Laterality: Right;  Some of the saphenous vein from the lower leg also used.  . ESOPHAGOGASTRODUODENOSCOPY (EGD) WITH PROPOFOL N/A 11/26/2019   Procedure: ESOPHAGOGASTRODUODENOSCOPY (EGD) WITH PROPOFOL;  Surgeon: Lin Landsman, MD;  Location: [facility];  Service: Gastroenterology;  Laterality: N/A;  . INTRAOPERATIVE TRANSESOPHAGEAL ECHOCARDIOGRAM N/A 04/18/2013   Procedure: INTRAOPERATIVE TRANSESOPHAGEAL  ECHOCARDIOGRAM;  Surgeon: Gaye Pollack, MD;  Location: [facility];  Service: Open Heart Surgery;  Laterality: N/A;    Social History   Socioeconomic History  . Marital status: Married    Spouse name: Not on file  . Number of children: Not on file  . Years of education: Not on file  . Highest education level: Not on file  Occupational History  . Occupation: not employed     Fish farm manager: [employer]  Tobacco Use  . Smoking status: Former Smoker    Types: Cigars    Quit date: 11/24/2019    Years since quitting: 0.0  . Smokeless tobacco: Never Used  . Tobacco comment: 1-2 cigars/day  Vaping Use  . Vaping Use: Never used  Substance and Sexual Activity  . Alcohol use: Yes    Comment: 5 bottles of wine/week/ Quit drinking about 5 months   . Drug use: No  . Sexual activity: Never  Other Topics Concern  . Not on file  Social History Narrative  . Not on file   Social Determinants of Health   Financial Resource Strain:   . Difficulty of Paying Living Expenses:   Food Insecurity:   . Worried About Charity fundraiser in the Last Year:   . Arboriculturist in the Last Year:   Transportation Needs:   . Film/video editor (Medical):   Marland Kitchen Lack of Transportation (Non-Medical):   Physical Activity:   . Days of Exercise per Week:   . Minutes of Exercise per Session:   Stress:   . Feeling of Stress :   Social Connections:   . Frequency of Communication with Friends and Family:   . Frequency of Social Gatherings with Friends and Family:   . Attends Religious Services:   . Active Member of Clubs or Organizations:   . Attends Archivist Meetings:   Marland Kitchen Marital Status:   Intimate Partner Violence:   . Fear of Current or Ex-Partner:   . Emotionally Abused:   Marland Kitchen Physically Abused:   . Sexually Abused:     Family History  Problem Relation Age of Onset  . Diabetes Mother      Current Outpatient Medications:  .  acetaminophen (TYLENOL) 325 MG tablet, Take 650 mg by mouth  every 6 (six) hours as needed., Disp: , Rfl:  .  Apixaban Starter Pack, 10mg  and 5mg , (ELIQUIS DVT/PE STARTER PACK), Take as directed on package: start with two-5mg  tablets twice daily for 7 days. On day 8, switch to one-5mg  tablet twice daily., Disp: 1 each, Rfl: 0 .  dexamethasone (DECADRON) 4 MG tablet, Take 1 tablet (4 mg total) by mouth 2 (two) times daily., Disp: 14 tablet, Rfl: 0 .  fentaNYL (DURAGESIC) 12 MCG/HR, Place 1 patch onto the skin every 3 (three) days., Disp: 5 patch, Rfl: 0 .  metoprolol tartrate (LOPRESSOR) 25 MG tablet, Take 25 mg by mouth daily., Disp: , Rfl:  .  ondansetron (ZOFRAN ODT) 8 MG disintegrating tablet, Take 1 tablet (8 mg total) by mouth every 6 (six) hours as needed for nausea or vomiting., Disp: 60 tablet, Rfl: 1 .  oxyCODONE (  OXY IR/ROXICODONE) 5 MG immediate release tablet, Take 1 tablet (5 mg total) by mouth every 6 (six) hours as needed for severe pain., Disp: 30 tablet, Rfl: 0 .  pantoprazole (PROTONIX) 40 MG tablet, Take 1 tablet (40 mg total) by mouth 2 (two) times daily., Disp: 60 tablet, Rfl: 0 .  prochlorperazine (COMPAZINE) 10 MG tablet, Take 1 tablet (10 mg total) by mouth every 6 (six) hours as needed for nausea or vomiting., Disp: 60 tablet, Rfl: 0 .  traZODone (DESYREL) 50 MG tablet, Take 50 mg by mouth at bedtime., Disp: , Rfl:  .  furosemide (LASIX) 20 MG tablet, Take 1 tablet (20 mg total) by mouth daily as needed. For leg swelling. (Patient not taking: Reported on 12/14/2019), Disp: 30 tablet, Rfl: 0 .  lactulose (CHRONULAC) 10 GM/15ML solution, Take 15-30 mLs (10-20 g total) by mouth 2 (two) times daily as needed for mild constipation. (Patient not taking: Reported on 12/14/2019), Disp: 236 mL, Rfl: 0 .  magnesium chloride (SLOW-MAG) 64 MG TBEC SR tablet, Take 1 tablet (64 mg total) by mouth daily. (Patient not taking: Reported on 12/12/2019), Disp: 30 tablet, Rfl: 0 .  Morphine Sulfate (MORPHINE CONCENTRATE) 10 mg / 0.5 ml concentrated solution, Take  0.25-0.5 mLs (5-10 mg total) by mouth every 3 (three) hours as needed for severe pain or shortness of breath., Disp: 30 mL, Rfl: 0 .  polyethylene glycol (MIRALAX) 17 g packet, Take 17 g by mouth daily as needed. (Patient not taking: Reported on 12/14/2019), Disp: 14 each, Rfl: 0 .  senna (SENOKOT) 8.6 MG TABS tablet, Take 2 tablets (17.2 mg total) by mouth in the morning and at bedtime. (Patient not taking: Reported on 12/14/2019), Disp: 120 tablet, Rfl: 0 .  Sennosides (SENNA) 8.8 MG/5ML SYRP, Take 5-10 mLs (8.8-17.6 mg total) by mouth 2 (two) times daily. (Patient not taking: Reported on 12/14/2019), Disp: 105 mL, Rfl: 0 No current facility-administered medications for this visit.  Facility-Administered Medications Ordered in Other Visits:  .  sodium chloride flush (NS) 0.9 % injection 3 mL, 3 mL, Intravenous, Q12H, Arvil Chaco, PA-C  Physical exam:  Vitals:   12/14/19 0925  BP: 115/81  Pulse: (!) 46  Resp: 16  SpO2: 100%  Weight: 143 lb (64.9 kg)   Physical Exam Constitutional:      Appearance: Normal appearance. He is ill-appearing.  HENT:     Head: Normocephalic and atraumatic.  Eyes:     Pupils: Pupils are equal, round, and reactive to light.  Cardiovascular:     Rate and Rhythm: Normal rate and regular rhythm.     Heart sounds: Normal heart sounds. No murmur heard.   Pulmonary:     Effort: Pulmonary effort is normal.     Breath sounds: Normal breath sounds. No wheezing.  Abdominal:     General: Bowel sounds are normal. There is no distension.     Palpations: Abdomen is soft.     Tenderness: There is no abdominal tenderness.  Musculoskeletal:        General: Normal range of motion.     Cervical back: Normal range of motion.  Skin:    General: Skin is warm and dry.     Findings: No rash.  Neurological:     Mental Status: He is alert and oriented to person, place, and time.  Psychiatric:        Judgment: Judgment normal.      CMP Latest Ref Rng & Units 12/14/2019   Glucose  70 - 99 mg/dL 151(H)  BUN 8 - 23 mg/dL 13  Creatinine 0.61 - 1.24 mg/dL 0.68  Sodium 135 - 145 mmol/L 133(L)  Potassium 3.5 - 5.1 mmol/L 4.1  Chloride 98 - 111 mmol/L 101  CO2 22 - 32 mmol/L 25  Calcium 8.9 - 10.3 mg/dL 7.8(L)  Total Protein 6.5 - 8.1 g/dL 5.3(L)  Total Bilirubin 0.3 - 1.2 mg/dL 1.0  Alkaline Phos 38 - 126 U/L 62  AST 15 - 41 U/L 23  ALT 0 - 44 U/L 24   CBC Latest Ref Rng & Units 12/14/2019  WBC 4.0 - 10.5 K/uL 10.7(H)  Hemoglobin 13.0 - 17.0 g/dL 10.4(L)  Hematocrit 39 - 52 % 30.7(L)  Platelets 150 - 400 K/uL 185    No images are attached to the encounter.  DG Chest 2 View  Result Date: 11/26/2019 CLINICAL DATA:  Cough and weakness EXAM: CHEST - 2 VIEW COMPARISON:  Chest radiograph 10/19/2019 FINDINGS: Stable cardiomediastinal contours status post median sternotomy. Minimal atelectasis at the right lung base. No focal consolidation. On lateral view there is a 9 mm nodule inferiorly which is not well seen on the frontal projection. No pneumothorax or pleural effusion. No acute finding in the visualized skeleton. IMPRESSION: 1. No evidence of active disease in the chest. 2. 9 mm nodule inferiorly on the lateral view which is not well seen on the frontal projection. Consider nonemergent chest CT for further evaluation. Electronically Signed   By: Audie Pinto M.D.   On: 11/26/2019 17:12   US Abdomen Complete  Result Date: 12/05/2019 CLINICAL DATA:  Distended abdomen EXAM: ABDOMEN ULTRASOUND COMPLETE COMPARISON:  Ultrasound abdomen 10/19/2019 FINDINGS: Gallbladder: No gallstones or wall thickening visualized. No sonographic Murphy sign noted by sonographer. Patterson bile duct: Diameter: 3.8 mm Liver: Diffusely increased echogenicity liver parenchyma seen previously. No focal lesion. Portal vein is patent on color Doppler imaging with normal direction of blood flow towards the liver. IVC: No abnormality visualized. Pancreas: Visualized portion unremarkable.  Spleen: Size and appearance within normal limits. Right Kidney: Length: 10.6 cm. Echogenicity within normal limits. No mass or hydronephrosis visualized. Left Kidney: Length: 10.1 cm. Echogenicity within normal limits. No mass or hydronephrosis visualized. Abdominal aorta: No aneurysm visualized. Other findings: Moderate ascites.  Right pleural effusion. IMPRESSION: Echogenic liver suggesting fatty infiltration as noted previously Negative for gallstones Moderate ascites.  Right pleural effusion. Electronically Signed   By: Franchot Gallo M.D.   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 EXAM: NUCLEAR MEDICINE PET SKULL BASE TO THIGH TECHNIQUE: 8.21 mCi F-18 FDG was injected intravenously. Full-ring PET imaging was performed from the skull base to thigh after the radiotracer. CT data was obtained and used for attenuation correction and anatomic localization. Fasting blood glucose: 78 mg/dl COMPARISON:  None. FINDINGS: Mediastinal blood pool activity: SUV max 2.10 Liver activity: SUV max NA NECK: No hypermetabolic lymph nodes in the neck. Incidental CT findings: Extensive age advanced carotid artery calcifications. CHEST: 12 mm left paraesophageal lymph node on image 161/0 is hypermetabolic with SUV max of 9.60 and consistent with nodal metastasis. Moderate left and small right pleural effusions are noted. No hypermetabolic pleural nodules to suggest pleural metastatic disease. No worrisome pulmonary nodules on the lung windows to suggest pulmonary metastatic disease. No other hypermetabolic mediastinal or hilar lymph nodes. Incidental CT findings: Age advanced vascular calcifications and surgical changes from  prior gastric bypass surgery. Extensive three-vessel coronary artery calcifications. ABDOMEN/PELVIS: There is a large irregular mass involving the gastric cardia just below the GE  junction measuring approximately 5.0 x 4.8 cm. There appears to be direct extension into the surrounding soft tissues with a large nodal mass in the gastrohepatic ligament. Right-sided hepatic duodenum ligament adenopathy the measuring 2 cm with SUV max of 9.01. Mesenteric nodal mass adjacent to the SMA measures 3.5 cm on image 156/3. SUV max is 11.56. Right-sided retroperitoneal nodal mass measures 2.5 cm on image 165/3. SUV max is 11.86. No findings for hepatic metastatic disease. Moderate abdominal/pelvic ascites but no obvious omental or peritoneal surface lesions. Incidental CT findings: Significant age advanced vascular disease. SKELETON: No focal hypermetabolic activity to suggest skeletal metastasis. Incidental CT findings: none IMPRESSION: 1. 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 It appears to be directly infiltrating the surrounding soft tissues and there is bulky gastrohepatic ligament, paraduodenal, retroperitoneal and mesenteric adenopathy. There is also upward lymphatic disease with an enlarged and hypermetabolic mid level esophageal lymph node. 2. No findings for pulmonary metastatic disease or hepatic metastatic disease. 3. Bilateral pleural effusions and moderate volume abdominal/pelvic ascites. Electronically Signed   By: Marijo Sanes M.D.   On: 12/07/2019 15:49   US Venous Img Lower Bilateral  Result Date: 12/12/2019 CLINICAL DATA:  64 year old male with a history of DVT in leg swelling EXAM: BILATERAL LOWER EXTREMITY VENOUS DOPPLER ULTRASOUND TECHNIQUE: Gray-scale sonography with graded compression, as well as color Doppler and duplex ultrasound were performed to evaluate the lower extremity deep venous systems from the level of the Patterson femoral vein and including the Patterson femoral, femoral, profunda femoral, popliteal and calf veins including the posterior tibial, peroneal and gastrocnemius veins when visible. The superficial great saphenous vein was  also interrogated. Spectral Doppler was utilized to evaluate flow at rest and with distal augmentation maneuvers in the Patterson femoral, femoral and popliteal veins. COMPARISON:  No comparison available FINDINGS: RIGHT LOWER EXTREMITY Patterson Femoral Vein: No evidence of thrombus. Normal compressibility, respiratory phasicity and response to augmentation. Saphenofemoral Junction: No evidence of thrombus. Normal compressibility and flow on color Doppler imaging. Profunda Femoral Vein: No evidence of thrombus. Normal compressibility and flow on color Doppler imaging. Femoral Vein: No evidence of thrombus. Normal compressibility, respiratory phasicity and response to augmentation. Popliteal Vein: No evidence of thrombus. Normal compressibility, respiratory phasicity and response to augmentation. Calf Veins: No evidence of thrombus. Normal compressibility and flow on color Doppler imaging. Superficial Great Saphenous Vein: No evidence of thrombus. Normal compressibility and flow on color Doppler imaging. Other Findings:  None. LEFT LOWER EXTREMITY Patterson Femoral Vein: No evidence of thrombus. Normal compressibility, respiratory phasicity and response to augmentation. Saphenofemoral Junction: No evidence of thrombus. Normal compressibility and flow on color Doppler imaging. Profunda Femoral Vein: No evidence of thrombus. Normal compressibility and flow on color Doppler imaging. Femoral Vein: No evidence of thrombus. Normal compressibility, respiratory phasicity and response to augmentation. Popliteal Vein: Nonocclusive thrombus with incomplete compressibility of the popliteal vein. Calf Veins: Posterior tibial vein and peroneal vein patent. Occlusive thrombus of the gastrocnemius vein Superficial Great Saphenous Vein: No evidence of thrombus. Normal compressibility and flow on color Doppler imaging. Other Findings:  None. IMPRESSION: Sonographic survey of the left lower extremity positive for DVT involving the  gastrocnemius vein, with extension into the popliteal vein. Popliteal thrombus is not nonocclusive on the current duplex. Sonographic survey of the right lower extremity negative for DVT Electronically Signed  By: Corrie Mckusick D.O.   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 EXAM: ULTRASOUND GUIDED PARACENTESIS MEDICATIONS: None. COMPLICATIONS: None immediate. PROCEDURE: Informed written consent was obtained from the patient after a discussion of the risks, benefits and alternatives to treatment. A timeout was performed prior to the initiation of the procedure. Initial ultrasound was performed to localize ascites. The right lower abdomen was prepped and draped in the usual sterile fashion. 1% lidocaine was used for local anesthesia. Following this, a 6 Fr Safe-T-Centesis catheter was introduced. An ultrasound image was saved for documentation purposes. The paracentesis was performed. The catheter was removed and a dressing was applied. The patient tolerated the procedure well without immediate post procedural complication. FINDINGS: A total of approximately 2.2 L of cloudy, yellow fluid was removed. Samples were sent to the laboratory as requested by the clinical team. IMPRESSION: Successful ultrasound-guided paracentesis yielding 2.2 liters of peritoneal fluid. Electronically Signed   By: Aletta Edouard M.D.   On: 12/09/2019 16:34   US Paracentesis  Result Date: 12/06/2019 INDICATION: Patient with gastric cardia mass likely malignancy, new onset abdominal distension. Request to IR for diagnostic and therapeutic paracentesis. EXAM: ULTRASOUND GUIDED DIAGNOSTIC AND THERAPEUTIC PARACENTESIS MEDICATIONS: 10 mL 1% lidocaine COMPLICATIONS: None immediate. PROCEDURE: Informed written consent was obtained from the patient after a discussion of the risks, benefits and alternatives to treatment. A timeout was performed prior to the  initiation of the procedure. Initial ultrasound scanning demonstrates a moderate amount of ascites within the right lower abdominal quadrant. The right lower abdomen was prepped and draped in the usual sterile fashion. 1% lidocaine was used for local anesthesia. Following this, a 6 Fr Safe-T-Centesis catheter was introduced. An ultrasound image was saved for documentation purposes. The paracentesis was performed. The catheter was removed and a dressing was applied. The patient tolerated the procedure well without immediate post procedural complication. FINDINGS: A total of approximately 3.8 L of hazy yellow fluid was removed. Samples were sent to the laboratory as requested by the clinical team. IMPRESSION: Successful ultrasound-guided paracentesis yielding 3.8 liters of peritoneal fluid. Read by Candiss Norse, PA-C Electronically Signed   By: Sandi Mariscal M.D.   On: 12/06/2019 12:31   ECHOCARDIOGRAM COMPLETE  Result Date: 11/25/2019    ECHOCARDIOGRAM REPORT   Patient Name:   ROCKFORD LEINEN Date of Exam: 11/25/2019 Medical Rec #:  [id number]      Height:       68.0 in Accession #:    [id number]     Weight:       129.9 lb Date of Birth:  01/20/1956      BSA:          1.701 m Patient Age:    83 years       BP:           143/81 mmHg Patient Gender: M              HR:           58 bpm. Exam Location:  [facility] Procedure: 2D Echo, Color Doppler and Cardiac Doppler Indications:     R01.2 Abnormal Heart Sounds Nec  History:         Patient has no prior history of Echocardiogram examinations.                  CAD; Prior CABG.  Sonographer:     Charmayne Sheer RDCS (AE) Referring  Phys:  [id number] Arvil Chaco Diagnosing Phys: Ida Rogue MD IMPRESSIONS  1. Left ventricular ejection fraction, by estimation, is 60 to 65%. The left ventricle has normal function. The left ventricle has no regional wall motion abnormalities. Left ventricular diastolic parameters are consistent with Grade II diastolic dysfunction  (pseudonormalization).  2. Right ventricular systolic function is normal. The right ventricular size is normal. There is mildly elevated pulmonary artery systolic pressure.  3. Left atrial size was mildly dilated.  4. The aortic valve is heavily calcified. Aortic valve regurgitation is mild. Severe aortic valve stenosis. Aortic valve area, by VTI measures 0.86 cm. Aortic valve mean gradient measures 38.0 mmHg. Aortic valve Vmax measures 4.02 m/s. FINDINGS  Left Ventricle: Left ventricular ejection fraction, by estimation, is 60 to 65%. The left ventricle has normal function. The left ventricle has no regional wall motion abnormalities. The left ventricular internal cavity size was normal in size. There is  no left ventricular hypertrophy. Left ventricular diastolic parameters are consistent with Grade II diastolic dysfunction (pseudonormalization). Right Ventricle: The right ventricular size is normal. No increase in right ventricular wall thickness. Right ventricular systolic function is normal. There is mildly elevated pulmonary artery systolic pressure. The tricuspid regurgitant velocity is 2.53  m/s, and with an assumed right atrial pressure of 10 mmHg, the estimated right ventricular systolic pressure is 02.4 mmHg. Left Atrium: Left atrial size was mildly dilated. Right Atrium: Right atrial size was normal in size. Pericardium: There is no evidence of pericardial effusion. Mitral Valve: The mitral valve is normal in structure. There is mild thickening of the mitral valve leaflet(s). Normal mobility of the mitral valve leaflets. Mild mitral valve regurgitation. No evidence of mitral valve stenosis. MV peak gradient, 4.2 mmHg. The mean mitral valve gradient is 2.0 mmHg. Tricuspid Valve: The tricuspid valve is normal in structure. Tricuspid valve regurgitation is not demonstrated. No evidence of tricuspid stenosis. Aortic Valve: The aortic valve is normal in structure. Aortic valve regurgitation is mild. Severe  aortic stenosis is present. Aortic valve mean gradient measures 38.0 mmHg. Aortic valve peak gradient measures 64.6 mmHg. Aortic valve area, by VTI measures  0.86 cm. Pulmonic Valve: The pulmonic valve was normal in structure. Pulmonic valve regurgitation is not visualized. No evidence of pulmonic stenosis. Aorta: The aortic root is normal in size and structure. Venous: The inferior vena cava is normal in size with greater than 50% respiratory variability, suggesting right atrial pressure of 3 mmHg. IAS/Shunts: No atrial level shunt detected by color flow Doppler.  LEFT VENTRICLE PLAX 2D LVIDd:         3.94 cm  Diastology LVIDs:         2.13 cm  LV e' lateral:   9.90 cm/s LV PW:         1.26 cm  LV E/e' lateral: 10.4 LV IVS:        1.13 cm  LV e' medial:    5.22 cm/s LVOT diam:     2.40 cm  LV E/e' medial:  19.7 LV SV:         89 LV SV Index:   52 LVOT Area:     4.52 cm  RIGHT VENTRICLE RV Basal diam:  3.15 cm LEFT ATRIUM             Index       RIGHT ATRIUM           Index LA diam:        4.20 cm 2.47  cm/m  RA Area:     12.70 cm LA Vol (A2C):   62.5 ml 36.74 ml/m RA Volume:   27.80 ml  16.34 ml/m LA Vol (A4C):   48.2 ml 28.33 ml/m LA Biplane Vol: 55.2 ml 32.45 ml/m  AORTIC VALVE                    PULMONIC VALVE AV Area (Vmax):    0.82 cm     PV Vmax:       2.01 m/s AV Area (Vmean):   0.95 cm     PV Vmean:      135.000 cm/s AV Area (VTI):     0.86 cm     PV VTI:        0.470 m AV Vmax:           402.00 cm/s  PV Peak grad:  16.2 mmHg AV Vmean:          247.500 cm/s PV Mean grad:  9.0 mmHg AV VTI:            1.040 m AV Peak Grad:      64.6 mmHg AV Mean Grad:      38.0 mmHg LVOT Vmax:         72.50 cm/s LVOT Vmean:        51.900 cm/s LVOT VTI:          0.197 m LVOT/AV VTI ratio: 0.19  AORTA Ao Root diam: 3.80 cm MITRAL VALVE                TRICUSPID VALVE MV Area (PHT): 4.04 cm     TR Peak grad:   25.6 mmHg MV Peak grad:  4.2 mmHg     TR Vmax:        253.00 cm/s MV Mean grad:  2.0 mmHg MV Vmax:       1.02  m/s     SHUNTS MV Vmean:      58.1 cm/s    Systemic VTI:  0.20 m MV Decel Time: 188 msec     Systemic Diam: 2.40 cm MV E velocity: 103.00 cm/s MV A velocity: 45.00 cm/s MV E/A ratio:  2.29 Ida Rogue MD Electronically signed by Ida Rogue MD Signature Date/Time: 11/25/2019/2:19:12 PM    Final    Assessment and plan- Patient is a 64 y.o. male who presents to symptom management for follow-up, IV fluids and electrolyte replacement if needed.  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  Unfortunately, patient continues to decline rapidly and he is unable to swallow any of his medications including his Eliquis.  I am concerned that he will further decline before we are able to begin radiation which likely will take several weeks to notice an appreciable difference in his swallowing. Had discussion with Dr. Tasia Catchings and Altha Harm, NP about his declining performance status and Merrily Pew Borders would like to discuss goals of care. After long discussion, patient has chosen to enroll in hospice.  [facility] hosp[facility] called.  He will have a Pleurx catheter placed on Friday.  Orders placed.  Pain-daughter is asked for something additional to help manage his epigastric and thoracic back pain.  Merrily Pew has started him on morphine elixir.  Hospice will take over pain management.   Plan: Give 500 ml NS Give 10 mg IV dexamethasone, 8 mg IV Zofran and 2 mg morphine Referral to hospice placed Order placed for Pleurx catheter-scheduled for Friday, 12/16/2019 CODE STATUS-DNR  Disposition: RTC  on Friday for Pleurx catheter to be placed.   Visit Diagnosis 1. Cancer related pain   2. Other ascites   3. Malignant neoplasm of cardia of stomach [facility])     Patient expressed understanding and was in agreement with this plan. He also understands that He can [facility] at any time with any questions, concerns, or complaints.   Greater than 50% was spent in counseling and coordination of care  with this patient including but not limited to discussion of the relevant topics above (See A&P) including, but not limited to diagnosis and management of acute and chronic medical conditions.   Thank you for allowing me to participate in the care of this very pleasant patient.    Jacquelin Hawking, NP [place] [facility] Cell - [phone] Pager- [phone] 12/15/2019 12:12 PM

## 2019-12-14 NOTE — Telephone Encounter (Signed)
Daughter Miquel Dunn has been notified of the pleurex abdominal catheter is scheduled on 12/16/19 @ 1:30 arrive at 12:30.  Patient is being referred to Hospice, will cancel future appts at the [facility].  Miquel Dunn will [place] if patient needs anything before Hospice services have been initiated.

## 2019-12-14 NOTE — Telephone Encounter (Signed)
Done.. All appts scheduled at [facility] has been cx as requested.

## 2019-12-14 NOTE — Progress Notes (Signed)
Acute visit today for weakness, nausea and pain. Denies nausea this am. Having back pain low grade this am. Had a lot of pain yesterday which was relieved by taking an extra oxycodone and compazine for ongoing nausea. Has not started lactulose as his bowels are moving soft beige colored stool. Hasn't started lasix yet either as daughter was fearful he is not drinking hardly any fluids. Left lower leg has less fluid today.He has stopped using compression stockings.He is sleeping more and more. He seems to feel better after fluids and IV zofran.

## 2019-12-14 NOTE — Progress Notes (Signed)
[name]  [phone] [phone]   Name: Vernon Patterson Date: 12/14/2019 MRN: [id number]  DOB: June 12, 1955  Patient Care Team: Albina Billet, MD as PCP - General (Internal Medicine) Dionisio David, MD (Cardiology) Clent Jacks, RN as Oncology Nurse Navigator Earlie Server, MD as Consulting Physician (Oncology)    REASON FOR CONSULTATION: Vernon Patterson is a 64 y.o. male with multiple medical problems including CAD, severe aortic stenosis, depression/anxiety, who was hospitalized 11/23/2019-11/28/2019 with GI bleed.  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  Additionally, patient was found to have a left piriform sinus mass, which could reflect metastatic disease versus second primary.  Patient was found to have severe aortic stenosis but is not felt to be amenable to treatment at the present time given his ongoing issues with cancer.  He has had significant difficulty with eating and drinking with progressive weight loss.  Patient was referred to palliative care to help address goals and manage ongoing symptoms.  SOCIAL HISTORY:     reports that he quit smoking about 2 weeks ago. His smoking use included cigars. He has never used smokeless tobacco. He reports current alcohol use. He reports that he does not use drugs.  Patient is divorced.  He lives at home alone but his daughter from [place] is currently staying with him.  He has another daughter in [place].  Patient previously worked for [employer].  ADVANCE DIRECTIVES:  On file  CODE STATUS: DNR/DNI (DNR form completed on 12/14/19)  PAST MEDICAL HISTORY: Past Medical History:  Diagnosis Date  . Anxiety   . Coronary artery disease   . Depression   . Heart murmur   . Hypertension   . Nausea and vomiting 12/01/2019    PAST SURGICAL HISTORY:  Past Surgical History:    Procedure Laterality Date  . CARDIAC CATHETERIZATION    . CORONARY ARTERY BYPASS GRAFT    . CORONARY ARTERY BYPASS GRAFT N/A 04/18/2013   Procedure: CORONARY ARTERY BYPASS GRAFTING (CABG);  Surgeon: Gaye Pollack, MD;  Location: [place];  Service: Open Heart Surgery;  Laterality: N/A;  Times  3  using left internal mammary artery and endoscopically harvested right saphenous vein  . ENDOVEIN HARVEST OF GREATER SAPHENOUS VEIN Right 04/18/2013   Procedure: ENDOVEIN HARVEST OF GREATER SAPHENOUS VEIN;  Surgeon: Gaye Pollack, MD;  Location: [place];  Service: Open Heart Surgery;  Laterality: Right;  Some of the saphenous vein from the lower leg also used.  . ESOPHAGOGASTRODUODENOSCOPY (EGD) WITH PROPOFOL N/A 11/26/2019   Procedure: ESOPHAGOGASTRODUODENOSCOPY (EGD) WITH PROPOFOL;  Surgeon: Lin Landsman, MD;  Location: [place];  Service: Gastroenterology;  Laterality: N/A;  . INTRAOPERATIVE TRANSESOPHAGEAL ECHOCARDIOGRAM N/A 04/18/2013   Procedure: INTRAOPERATIVE TRANSESOPHAGEAL ECHOCARDIOGRAM;  Surgeon: Gaye Pollack, MD;  Location: [facility];  Service: Open Heart Surgery;  Laterality: N/A;    HEMATOLOGY/ONCOLOGY HISTORY:  Oncology History   No history exists.    ALLERGIES:  has No Known Allergies.  MEDICATIONS:  Current Outpatient Medications  Medication Sig Dispense Refill  . acetaminophen (TYLENOL) 325 MG tablet Take 650 mg by mouth every 6 (six) hours as needed.    Marland Kitchen Apixaban Starter Pack, 10mg  and 5mg , (ELIQUIS DVT/PE STARTER PACK) Take as directed on package: start with two-5mg  tablets twice daily for 7 days. On day 8, switch to one-5mg  tablet twice  daily. 1 each 0  . dexamethasone (DECADRON) 4 MG tablet Take 1 tablet (4 mg total) by mouth 2 (two) times daily. 14 tablet 0  . fentaNYL (DURAGESIC) 12 MCG/HR Place 1 patch onto the skin every 3 (three) days. 5 patch 0  . furosemide (LASIX) 20 MG tablet Take 1 tablet (20 mg total) by mouth daily as needed. For leg swelling. (Patient not  taking: Reported on 12/14/2019) 30 tablet 0  . lactulose (CHRONULAC) 10 GM/15ML solution Take 15-30 mLs (10-20 g total) by mouth 2 (two) times daily as needed for mild constipation. (Patient not taking: Reported on 12/14/2019) 236 mL 0  . magnesium chloride (SLOW-MAG) 64 MG TBEC SR tablet Take 1 tablet (64 mg total) by mouth daily. (Patient not taking: Reported on 12/12/2019) 30 tablet 0  . metoprolol tartrate (LOPRESSOR) 25 MG tablet Take 25 mg by mouth daily.    . Morphine Sulfate (MORPHINE CONCENTRATE) 10 mg / 0.5 ml concentrated solution Take 0.25-0.5 mLs (5-10 mg total) by mouth every 3 (three) hours as needed for severe pain or shortness of breath. 30 mL 0  . ondansetron (ZOFRAN ODT) 8 MG disintegrating tablet Take 1 tablet (8 mg total) by mouth every 6 (six) hours as needed for nausea or vomiting. 60 tablet 1  . oxyCODONE (OXY IR/ROXICODONE) 5 MG immediate release tablet Take 1 tablet (5 mg total) by mouth every 6 (six) hours as needed for severe pain. 30 tablet 0  . pantoprazole (PROTONIX) 40 MG tablet Take 1 tablet (40 mg total) by mouth 2 (two) times daily. 60 tablet 0  . polyethylene glycol (MIRALAX) 17 g packet Take 17 g by mouth daily as needed. (Patient not taking: Reported on 12/14/2019) 14 each 0  . prochlorperazine (COMPAZINE) 10 MG tablet Take 1 tablet (10 mg total) by mouth every 6 (six) hours as needed for nausea or vomiting. 60 tablet 0  . senna (SENOKOT) 8.6 MG TABS tablet Take 2 tablets (17.2 mg total) by mouth in the morning and at bedtime. (Patient not taking: Reported on 12/14/2019) 120 tablet 0  . Sennosides (SENNA) 8.8 MG/5ML SYRP Take 5-10 mLs (8.8-17.6 mg total) by mouth 2 (two) times daily. (Patient not taking: Reported on 12/14/2019) 105 mL 0  . traZODone (DESYREL) 50 MG tablet Take 50 mg by mouth at bedtime.     No current facility-administered medications for this visit.   Facility-Administered Medications Ordered in Other Visits  Medication Dose Route Frequency Provider Last  Rate Last Admin  . 0.9 %  sodium chloride infusion   Intravenous Continuous Jacquelin Hawking, NP 20 mL/hr at 12/14/19 1137 Rate Change at 12/14/19 1137  . sodium chloride flush (NS) 0.9 % injection 3 mL  3 mL Intravenous Q12H Visser, Jacquelyn D, PA-C        VITAL SIGNS: There were no vitals taken for this visit. There were no vitals filed for this visit.  Estimated body mass index is 21.74 kg/m as calculated from the following:   Height as of 11/26/19: 5\' 8"  (1.727 m).   Weight as of an earlier encounter on 12/14/19: 143 lb (64.9 kg).  LABS: CBC:    Component Value Date/Time   WBC 10.7 (H) 12/14/2019 1107   HGB 10.4 (L) 12/14/2019 1107   HGB 14.6 04/18/2013 0454   HCT 30.7 (L) 12/14/2019 1107   HCT 42.9 04/16/2013 0419   PLT 185 12/14/2019 1107   PLT 117 (L) 04/18/2013 0454   MCV 93.6 12/14/2019 1107   MCV  101 (H) 04/16/2013 0419   NEUTROABS 8.8 (H) 12/14/2019 1107   LYMPHSABS 1.0 12/14/2019 1107   MONOABS 0.9 12/14/2019 1107   EOSABS 0.0 12/14/2019 1107   BASOSABS 0.0 12/14/2019 1107   Comprehensive Metabolic Panel:    Component Value Date/Time   NA 133 (L) 12/14/2019 1107   NA 137 04/16/2013 0419   K 4.1 12/14/2019 1107   K 3.8 04/16/2013 0419   CL 101 12/14/2019 1107   CL 105 04/16/2013 0419   CO2 25 12/14/2019 1107   CO2 23 04/16/2013 0419   BUN 13 12/14/2019 1107   BUN 9 04/16/2013 0419   CREATININE 0.68 12/14/2019 1107   CREATININE 0.74 04/16/2013 0419   GLUCOSE 151 (H) 12/14/2019 1107   GLUCOSE 98 04/16/2013 0419   CALCIUM 7.8 (L) 12/14/2019 1107   CALCIUM 8.8 04/16/2013 0419   AST 23 12/14/2019 1107   AST 161 (H) 04/16/2013 0419   ALT 24 12/14/2019 1107   ALT 173 (H) 04/16/2013 0419   ALKPHOS 62 12/14/2019 1107   ALKPHOS 64 04/16/2013 0419   BILITOT 1.0 12/14/2019 1107   BILITOT 0.5 04/16/2013 0419   PROT 5.3 (L) 12/14/2019 1107   PROT 7.2 04/16/2013 0419   ALBUMIN 2.4 (L) 12/14/2019 1107   ALBUMIN 3.5 04/16/2013 0419    RADIOGRAPHIC  STUDIES: DG Chest 2 View  Result Date: 11/26/2019 CLINICAL DATA:  Cough and weakness EXAM: CHEST - 2 VIEW COMPARISON:  Chest radiograph 10/19/2019 FINDINGS: Stable cardiomediastinal contours status post median sternotomy. Minimal atelectasis at the right lung base. No focal consolidation. On lateral view there is a 9 mm nodule inferiorly which is not well seen on the frontal projection. No pneumothorax or pleural effusion. No acute finding in the visualized skeleton. IMPRESSION: 1. No evidence of active disease in the chest. 2. 9 mm nodule inferiorly on the lateral view which is not well seen on the frontal projection. Consider nonemergent chest CT for further evaluation. Electronically Signed   By: Audie Pinto M.D.   On: 11/26/2019 17:12   US Abdomen Complete  Result Date: 12/05/2019 CLINICAL DATA:  Distended abdomen EXAM: ABDOMEN ULTRASOUND COMPLETE COMPARISON:  Ultrasound abdomen 10/19/2019 FINDINGS: Gallbladder: No gallstones or wall thickening visualized. No sonographic Murphy sign noted by sonographer. Common bile duct: Diameter: 3.8 mm Liver: Diffusely increased echogenicity liver parenchyma seen previously. No focal lesion. Portal vein is patent on color Doppler imaging with normal direction of blood flow towards the liver. IVC: No abnormality visualized. Pancreas: Visualized portion unremarkable. Spleen: Size and appearance within normal limits. Right Kidney: Length: 10.6 cm. Echogenicity within normal limits. No mass or hydronephrosis visualized. Left Kidney: Length: 10.1 cm. Echogenicity within normal limits. No mass or hydronephrosis visualized. Abdominal aorta: No aneurysm visualized. Other findings: Moderate ascites.  Right pleural effusion. IMPRESSION: Echogenic liver suggesting fatty infiltration as noted previously Negative for gallstones Moderate ascites.  Right pleural effusion. Electronically Signed   By: Franchot Gallo M.D.   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 EXAM: NUCLEAR MEDICINE PET SKULL BASE TO THIGH TECHNIQUE: 8.21 mCi F-18 FDG was injected intravenously. Full-ring PET imaging was performed from the skull base to thigh after the radiotracer. CT data was obtained and used for attenuation correction and anatomic localization. Fasting blood glucose: 78 mg/dl COMPARISON:  None. FINDINGS: Mediastinal blood pool activity: SUV max 2.10 Liver activity: SUV max NA NECK: No  hypermetabolic lymph nodes in the neck. Incidental CT findings: Extensive age advanced carotid artery calcifications. CHEST: 12 mm left paraesophageal lymph node on image 102/7 is hypermetabolic with SUV max of 2.53 and consistent with nodal metastasis. Moderate left and small right pleural effusions are noted. No hypermetabolic pleural nodules to suggest pleural metastatic disease. No worrisome pulmonary nodules on the lung windows to suggest pulmonary metastatic disease. No other hypermetabolic mediastinal or hilar lymph nodes. Incidental CT findings: Age advanced vascular calcifications and surgical changes from prior gastric bypass surgery. Extensive three-vessel coronary artery calcifications. ABDOMEN/PELVIS: There is a large irregular mass involving the gastric cardia just below the GE junction measuring approximately 5.0 x 4.8 cm. There appears to be direct extension into the surrounding soft tissues with a large nodal mass in the gastrohepatic ligament. Right-sided hepatic duodenum ligament adenopathy the measuring 2 cm with SUV max of 9.01. Mesenteric nodal mass adjacent to the SMA measures 3.5 cm on image 156/3. SUV max is 11.56. Right-sided retroperitoneal nodal mass measures 2.5 cm on image 165/3. SUV max is 11.86. No findings for hepatic metastatic disease. Moderate abdominal/pelvic ascites but no obvious omental or peritoneal surface lesions. Incidental CT findings: Significant age  advanced vascular disease. SKELETON: No focal hypermetabolic activity to suggest skeletal metastasis. Incidental CT findings: none IMPRESSION: 1. 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 It appears to be directly infiltrating the surrounding soft tissues and there is bulky gastrohepatic ligament, paraduodenal, retroperitoneal and mesenteric adenopathy. There is also upward lymphatic disease with an enlarged and hypermetabolic mid level esophageal lymph node. 2. No findings for pulmonary metastatic disease or hepatic metastatic disease. 3. Bilateral pleural effusions and moderate volume abdominal/pelvic ascites. Electronically Signed   By: Marijo Sanes M.D.   On: 12/07/2019 15:49   US Venous Img Lower Bilateral  Result Date: 12/12/2019 CLINICAL DATA:  64 year old male with a history of DVT in leg swelling EXAM: BILATERAL LOWER EXTREMITY VENOUS DOPPLER ULTRASOUND TECHNIQUE: Gray-scale sonography with graded compression, as well as color Doppler and duplex ultrasound were performed to evaluate the lower extremity deep venous systems from the level of the common femoral vein and including the common femoral, femoral, profunda femoral, popliteal and calf veins including the posterior tibial, peroneal and gastrocnemius veins when visible. The superficial great saphenous vein was also interrogated. Spectral Doppler was utilized to evaluate flow at rest and with distal augmentation maneuvers in the common femoral, femoral and popliteal veins. COMPARISON:  No comparison available FINDINGS: RIGHT LOWER EXTREMITY Common Femoral Vein: No evidence of thrombus. Normal compressibility, respiratory phasicity and response to augmentation. Saphenofemoral Junction: No evidence of thrombus. Normal compressibility and flow on color Doppler imaging. Profunda Femoral Vein: No evidence of thrombus. Normal compressibility and flow on color Doppler imaging. Femoral Vein: No evidence of thrombus. Normal  compressibility, respiratory phasicity and response to augmentation. Popliteal Vein: No evidence of thrombus. Normal compressibility, respiratory phasicity and response to augmentation. Calf Veins: No evidence of thrombus. Normal compressibility and flow on color Doppler imaging. Superficial Great Saphenous Vein: No evidence of thrombus. Normal compressibility and flow on color Doppler imaging. Other Findings:  None. LEFT LOWER EXTREMITY Common Femoral Vein: No evidence of thrombus. Normal compressibility, respiratory phasicity and response to augmentation. Saphenofemoral Junction: No evidence of thrombus. Normal compressibility and flow on color Doppler imaging. Profunda Femoral Vein: No evidence of thrombus. Normal compressibility and flow on color Doppler imaging. Femoral Vein: No evidence of thrombus. Normal compressibility, respiratory phasicity and response to augmentation. Popliteal Vein: Nonocclusive thrombus  with incomplete compressibility of the popliteal vein. Calf Veins: Posterior tibial vein and peroneal vein patent. Occlusive thrombus of the gastrocnemius vein Superficial Great Saphenous Vein: No evidence of thrombus. Normal compressibility and flow on color Doppler imaging. Other Findings:  None. IMPRESSION: Sonographic survey of the left lower extremity positive for DVT involving the gastrocnemius vein, with extension into the popliteal vein. Popliteal thrombus is not nonocclusive on the current duplex. Sonographic survey of the right lower extremity negative for DVT Electronically Signed   By: Corrie Mckusick D.O.   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 EXAM: ULTRASOUND GUIDED PARACENTESIS MEDICATIONS: None. COMPLICATIONS: None immediate. PROCEDURE: Informed written consent was obtained from the patient after a discussion of the risks, benefits and alternatives to treatment. A timeout was performed prior to the  initiation of the procedure. Initial ultrasound was performed to localize ascites. The right lower abdomen was prepped and draped in the usual sterile fashion. 1% lidocaine was used for local anesthesia. Following this, a 6 Fr Safe-T-Centesis catheter was introduced. An ultrasound image was saved for documentation purposes. The paracentesis was performed. The catheter was removed and a dressing was applied. The patient tolerated the procedure well without immediate post procedural complication. FINDINGS: A total of approximately 2.2 L of cloudy, yellow fluid was removed. Samples were sent to the laboratory as requested by the clinical team. IMPRESSION: Successful ultrasound-guided paracentesis yielding 2.2 liters of peritoneal fluid. Electronically Signed   By: Aletta Edouard M.D.   On: 12/09/2019 16:34   US Paracentesis  Result Date: 12/06/2019 INDICATION: Patient with gastric cardia mass likely malignancy, new onset abdominal distension. Request to IR for diagnostic and therapeutic paracentesis. EXAM: ULTRASOUND GUIDED DIAGNOSTIC AND THERAPEUTIC PARACENTESIS MEDICATIONS: 10 mL 1% lidocaine COMPLICATIONS: None immediate. PROCEDURE: Informed written consent was obtained from the patient after a discussion of the risks, benefits and alternatives to treatment. A timeout was performed prior to the initiation of the procedure. Initial ultrasound scanning demonstrates a moderate amount of ascites within the right lower abdominal quadrant. The right lower abdomen was prepped and draped in the usual sterile fashion. 1% lidocaine was used for local anesthesia. Following this, a 6 Fr Safe-T-Centesis catheter was introduced. An ultrasound image was saved for documentation purposes. The paracentesis was performed. The catheter was removed and a dressing was applied. The patient tolerated the procedure well without immediate post procedural complication. FINDINGS: A total of approximately 3.8 L of hazy yellow fluid was  removed. Samples were sent to the laboratory as requested by the clinical team. IMPRESSION: Successful ultrasound-guided paracentesis yielding 3.8 liters of peritoneal fluid. Read by Candiss Norse, PA-C Electronically Signed   By: Sandi Mariscal M.D.   On: 12/06/2019 12:31   ECHOCARDIOGRAM COMPLETE  Result Date: 11/25/2019    ECHOCARDIOGRAM REPORT   Patient Name:   Vernon Patterson Date of Exam: 11/25/2019 Medical Rec #:  [id number]      Height:       68.0 in Accession #:    [id number]     Weight:       129.9 lb Date of Birth:  Jun 26, 1955      BSA:          1.701 m Patient Age:    75 years       BP:           143/81 mmHg Patient Gender: M  HR:           58 bpm. Exam Location:  [facility] Procedure: 2D Echo, Color Doppler and Cardiac Doppler Indications:     R01.2 Abnormal Heart Sounds Nec  History:         Patient has no prior history of Echocardiogram examinations.                  CAD; Prior CABG.  Sonographer:     Charmayne Sheer RDCS (AE) Referring Phys:  [id number] Arvil Chaco Diagnosing Phys: Ida Rogue MD IMPRESSIONS  1. Left ventricular ejection fraction, by estimation, is 60 to 65%. The left ventricle has normal function. The left ventricle has no regional wall motion abnormalities. Left ventricular diastolic parameters are consistent with Grade II diastolic dysfunction (pseudonormalization).  2. Right ventricular systolic function is normal. The right ventricular size is normal. There is mildly elevated pulmonary artery systolic pressure.  3. Left atrial size was mildly dilated.  4. The aortic valve is heavily calcified. Aortic valve regurgitation is mild. Severe aortic valve stenosis. Aortic valve area, by VTI measures 0.86 cm. Aortic valve mean gradient measures 38.0 mmHg. Aortic valve Vmax measures 4.02 m/s. FINDINGS  Left Ventricle: Left ventricular ejection fraction, by estimation, is 60 to 65%. The left ventricle has normal function. The left ventricle has no regional wall motion  abnormalities. The left ventricular internal cavity size was normal in size. There is  no left ventricular hypertrophy. Left ventricular diastolic parameters are consistent with Grade II diastolic dysfunction (pseudonormalization). Right Ventricle: The right ventricular size is normal. No increase in right ventricular wall thickness. Right ventricular systolic function is normal. There is mildly elevated pulmonary artery systolic pressure. The tricuspid regurgitant velocity is 2.53  m/s, and with an assumed right atrial pressure of 10 mmHg, the estimated right ventricular systolic pressure is 03.0 mmHg. Left Atrium: Left atrial size was mildly dilated. Right Atrium: Right atrial size was normal in size. Pericardium: There is no evidence of pericardial effusion. Mitral Valve: The mitral valve is normal in structure. There is mild thickening of the mitral valve leaflet(s). Normal mobility of the mitral valve leaflets. Mild mitral valve regurgitation. No evidence of mitral valve stenosis. MV peak gradient, 4.2 mmHg. The mean mitral valve gradient is 2.0 mmHg. Tricuspid Valve: The tricuspid valve is normal in structure. Tricuspid valve regurgitation is not demonstrated. No evidence of tricuspid stenosis. Aortic Valve: The aortic valve is normal in structure. Aortic valve regurgitation is mild. Severe aortic stenosis is present. Aortic valve mean gradient measures 38.0 mmHg. Aortic valve peak gradient measures 64.6 mmHg. Aortic valve area, by VTI measures  0.86 cm. Pulmonic Valve: The pulmonic valve was normal in structure. Pulmonic valve regurgitation is not visualized. No evidence of pulmonic stenosis. Aorta: The aortic root is normal in size and structure. Venous: The inferior vena cava is normal in size with greater than 50% respiratory variability, suggesting right atrial pressure of 3 mmHg. IAS/Shunts: No atrial level shunt detected by color flow Doppler.  LEFT VENTRICLE PLAX 2D LVIDd:         3.94 cm  Diastology  LVIDs:         2.13 cm  LV e' lateral:   9.90 cm/s LV PW:         1.26 cm  LV E/e' lateral: 10.4 LV IVS:        1.13 cm  LV e' medial:    5.22 cm/s LVOT diam:     2.40 cm  LV  E/e' medial:  19.7 LV SV:         89 LV SV Index:   52 LVOT Area:     4.52 cm  RIGHT VENTRICLE RV Basal diam:  3.15 cm LEFT ATRIUM             Index       RIGHT ATRIUM           Index LA diam:        4.20 cm 2.47 cm/m  RA Area:     12.70 cm LA Vol (A2C):   62.5 ml 36.74 ml/m RA Volume:   27.80 ml  16.34 ml/m LA Vol (A4C):   48.2 ml 28.33 ml/m LA Biplane Vol: 55.2 ml 32.45 ml/m  AORTIC VALVE                    PULMONIC VALVE AV Area (Vmax):    0.82 cm     PV Vmax:       2.01 m/s AV Area (Vmean):   0.95 cm     PV Vmean:      135.000 cm/s AV Area (VTI):     0.86 cm     PV VTI:        0.470 m AV Vmax:           402.00 cm/s  PV Peak grad:  16.2 mmHg AV Vmean:          247.500 cm/s PV Mean grad:  9.0 mmHg AV VTI:            1.040 m AV Peak Grad:      64.6 mmHg AV Mean Grad:      38.0 mmHg LVOT Vmax:         72.50 cm/s LVOT Vmean:        51.900 cm/s LVOT VTI:          0.197 m LVOT/AV VTI ratio: 0.19  AORTA Ao Root diam: 3.80 cm MITRAL VALVE                TRICUSPID VALVE MV Area (PHT): 4.04 cm     TR Peak grad:   25.6 mmHg MV Peak grad:  4.2 mmHg     TR Vmax:        253.00 cm/s MV Mean grad:  2.0 mmHg MV Vmax:       1.02 m/s     SHUNTS MV Vmean:      58.1 cm/s    Systemic VTI:  0.20 m MV Decel Time: 188 msec     Systemic Diam: 2.40 cm MV E velocity: 103.00 cm/s MV A velocity: 45.00 cm/s MV E/A ratio:  2.29 Ida Rogue MD Electronically signed by Ida Rogue MD Signature Date/Time: 11/25/2019/2:19:12 PM    Final     PERFORMANCE STATUS (ECOG) : 3 - Symptomatic, >50% confined to bed  Review of Systems Unless otherwise noted, a complete review of systems is negative.  Physical Exam General: NAD Pulmonary: Unlabored Extremities: no edema, no joint deformities Skin: no rashes Neurological: Weakness but otherwise  nonfocal  IMPRESSION: Patient was an add-on to my clinic schedule today.  He was concurrently seen in [facility] for progressive weakness and overall decline.  Patient is having increasing difficulty with oral intake and swallowing medications.  He was pending XRT next week.  I long conversation with patient and his daughter regarding overall goals.  In light of decline, patient says his preference would be to stay home and  focus on comfort.  He stated that he wants to forego XRT and would be interested in hospice involvement at home.  His daughter was in agreement with this plan.  An order was called and to [facility].   Patient has had some pain with difficulty swallowing oxycodone.  We will start morphine elixir.  Continue transdermal fentanyl, although it is questionable how much he is absorbing given his rapid weight loss.  Unfortunately, there are not many alternatives that would be covered by the hospice formulary and which patient could also swallow.  Patient does appear to have some abdominal distention concerning for reaccumulation of ascites.  Discussed option of repeat therapeutic paracentesis versus insertion of a Tenckhoff catheter allowing for home management.  Patient is interested in the Tenckhoff so we will pursue scheduling through IR.  We discussed CODE STATUS today.  Patient stated clearly that he would not want to be resuscitated nor have his life prolonged artificially machines.  His daughter was in agreement with DNR/DNI.  PLAN: -Best supportive care -Referral to hospice -Morphine elixir 5 to 10 mg every 3 hours as needed for pain -Continue transdermal fentanyl 12 mcg every 72 hours -Continue bowel regimen -IR for Tenckhoff catheter -DNR/DNI -RTC as needed  Case and plan discussed with Dr. Tasia Catchings and Rulon Abide, NP  Patient expressed understanding and was in agreement with this plan. He also understands that He can [facility] the clinic at any time with any questions,  concerns, or complaints.     Time Total: 25 minutes  Visit consisted of counseling and education dealing with the complex and emotionally intense issues of symptom management and palliative care in the setting of serious and potentially life-threatening illness.Greater than 50%  of this time was spent counseling and coordinating care related to the above assessment and plan.  Signed by: Altha Harm, PhD, NP-C

## 2019-12-15 ENCOUNTER — Inpatient Hospital Stay: Payer: Medicare Other

## 2019-12-15 ENCOUNTER — Other Ambulatory Visit: Payer: Medicare Other | Admitting: Adult Health Nurse Practitioner

## 2019-12-15 DIAGNOSIS — Z515 Encounter for palliative care: Secondary | ICD-10-CM

## 2019-12-15 DIAGNOSIS — C160 Malignant neoplasm of cardia: Secondary | ICD-10-CM

## 2019-12-15 NOTE — Progress Notes (Signed)
Spoke with patient's daughter, for pleurx cath placement, 12/16/2019, to be here at 0900, NPO after Mn.

## 2019-12-16 ENCOUNTER — Ambulatory Visit
Admission: RE | Admit: 2019-12-16 | Discharge: 2019-12-16 | Disposition: A | Discharge status: Home / Self Care | Payer: Medicare Other | Source: Ambulatory Visit | Attending: Oncology | Admitting: Oncology

## 2019-12-16 ENCOUNTER — Ambulatory Visit: Admission: RE | Admit: 2019-12-16 | Payer: Medicare Other | Source: Ambulatory Visit

## 2019-12-16 ENCOUNTER — Other Ambulatory Visit: Payer: Self-pay

## 2019-12-16 DIAGNOSIS — F419 Anxiety disorder, unspecified: Secondary | ICD-10-CM | POA: Diagnosis not present

## 2019-12-16 DIAGNOSIS — I2510 Atherosclerotic heart disease of native coronary artery without angina pectoris: Secondary | ICD-10-CM | POA: Insufficient documentation

## 2019-12-16 DIAGNOSIS — Z79899 Other long term (current) drug therapy: Secondary | ICD-10-CM | POA: Diagnosis not present

## 2019-12-16 DIAGNOSIS — I10 Essential (primary) hypertension: Secondary | ICD-10-CM | POA: Diagnosis not present

## 2019-12-16 DIAGNOSIS — C160 Malignant neoplasm of cardia: Secondary | ICD-10-CM | POA: Insufficient documentation

## 2019-12-16 DIAGNOSIS — R180 Malignant ascites: Secondary | ICD-10-CM | POA: Diagnosis not present

## 2019-12-16 DIAGNOSIS — F329 Major depressive disorder, single episode, unspecified: Secondary | ICD-10-CM | POA: Insufficient documentation

## 2019-12-16 DIAGNOSIS — R140 Abdominal distension (gaseous): Secondary | ICD-10-CM | POA: Insufficient documentation

## 2019-12-16 DIAGNOSIS — Z7901 Long term (current) use of anticoagulants: Secondary | ICD-10-CM | POA: Insufficient documentation

## 2019-12-16 DIAGNOSIS — R188 Other ascites: Secondary | ICD-10-CM

## 2019-12-16 MED ORDER — MIDAZOLAM HCL 2 MG/2ML IJ SOLN
INTRAMUSCULAR | Status: AC | PRN
  Administered 2019-12-16: 1 mg via INTRAVENOUS

## 2019-12-16 MED ORDER — FENTANYL CITRATE (PF) 100 MCG/2ML IJ SOLN
INTRAMUSCULAR | Status: AC | PRN
  Administered 2019-12-16: 50 ug via INTRAVENOUS

## 2019-12-16 MED ORDER — MIDAZOLAM HCL 2 MG/2ML IJ SOLN
INTRAMUSCULAR | Status: AC
  Filled 2019-12-16: qty 2

## 2019-12-16 MED ORDER — FENTANYL CITRATE (PF) 100 MCG/2ML IJ SOLN
INTRAMUSCULAR | Status: AC
  Filled 2019-12-16: qty 2

## 2019-12-16 MED ORDER — SODIUM CHLORIDE 0.9 % IV SOLN
INTRAVENOUS | Status: DC
  Administered 2019-12-16: 10:00:00 1000 mL via INTRAVENOUS

## 2019-12-16 MED ORDER — CEFAZOLIN SODIUM-DEXTROSE 2-4 GM/100ML-% IV SOLN
INTRAVENOUS | Status: AC
  Filled 2019-12-16: qty 100

## 2019-12-16 MED ORDER — CEFAZOLIN SODIUM-DEXTROSE 1-4 GM/50ML-% IV SOLN
INTRAVENOUS | Status: AC | PRN
  Administered 2019-12-16: 2 g via INTRAVENOUS

## 2019-12-16 NOTE — Progress Notes (Signed)
Patient clinically stable post Pleurx catheter placement per Dr Annamaria Boots, tolerated well. Denies complaints at this time. pleurx volume removed 1842ml serous drainage, tolerated well.awake/alert and oriented post procedure. Vitals stable throughout. Report given to Genelle Bal RN post procedure. Dressing dry and intact to site.

## 2019-12-16 NOTE — Progress Notes (Addendum)
[facility] Consult Note Telephone: [phone]  Fax: [phone]  PATIENT NAME: Vernon Patterson DOB: 1955-05-13 MRN: [id number]  PRIMARY CARE PROVIDER:   Albina Billet, MD  REFERRING PROVIDER:  Albina Billet, MD [address]   [place],  [place] [address]  RESPONSIBLE PARTY:   Daughter, Miquel Dunn [phone]    RECOMMENDATIONS and PLAN:  1.  Advanced care planning patient is DNR/DNI  2.  Functional status.  Patient mostly ambulates without assistive devices.  Does have a walker for times when he feels more unstable or having to walk for longer distances.  Is able to perform ADLs but is starting to require more assistance with showering.  3.  Nutritional status.  Patient does not have much of an appetite states that it does come and go.  Starting to have some difficulty swallowing but is still able to swallow smaller size pills.  Patient does have nausea and has as needed Zofran and Compazine for this.  Daughters states that he eats mostly ice cream and cheese cake.  He will take bites of other things.  Patient is not able to tolerate supplementation with Ensure or boost.  Encouraged to eat what ever he is able to eat and to try more frequent smaller meals.  4.  Pain.  Patient is currently on fentanyl patch 12 mcg every 3 days and oxycodone 5 mg every 6 hours as needed.  Right now rates his pain at 1-2 out of 10.  Daughters have concern if fentanyl patch is working as patient is thin and may not be absorbing the medication.  Patient states he takes 1-2 Oxy a day most days and tries to stay on top of it.  Fentanyl may be providing some benefit as his pain level is low and he does not have to supplement much with the oxycodone.  Continue this pain regimen as it does seem to be providing benefit for pain control.  5.  Constipation.  Daughters do state that he was having constipation after coming home from the hospital on 11/28/2019 and was taking  MiraLAX daily and senna daily he started having more frequent soft stools so they stopped the MiraLAX and senna.  Have encouraged not to go more than 2 days without a bowel movement without providing something like MiraLAX or senna as needed.  Patient does have hospice evaluation this weekend.  Went ahead and kept today's visit in case he needed medications.  Patient was down to 10 oxycodone left.  Did send in a 10-day supply of his oxycodone until he gets evaluated by hospice.  I spent 60 minutes providing this consultation,  from 1:30 to 2:30 including time spent with patient/family, chart review, provider coordination, documentation. More than 50% of the time in this consultation was spent coordinating communication.   HISTORY OF PRESENT ILLNESS:  Vernon Patterson is a 64 y.o. 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 Palliative Care was asked to help address goals of care.  7/14 through 11/28/2019 patient was in hospital for GI bleed.  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  Patient does get fluid buildup in abdomen.  Is going tomorrow to have drain put in.  His weight does fluctuate  related to the fluid currently weighs 143 pounds but been as low as 129 pounds.  Patient is not undergoing any treatment for his cancer.  CODE STATUS: DNR  PPS: 50% HOSPICE ELIGIBILITY/DIAGNOSIS: TBD  PHYSICAL EXAM:   General: NAD, frail appearing, thin Extremities: no edema, no joint deformities Skin: no rashes on exposed skin Neurological: Weakness but otherwise nonfocal; does have forgetfulness   PAST MEDICAL HISTORY:  Past Medical History:  Diagnosis Date  . Anxiety   . Coronary artery disease   . Depression   . Heart murmur   .  Hypertension   . Nausea and vomiting 12/01/2019    SOCIAL HX:  Social History   Tobacco Use  . Smoking status: Former Smoker    Types: Cigars    Quit date: 11/24/2019    Years since quitting: 0.0  . Smokeless tobacco: Never Used  . Tobacco comment: 1-2 cigars/day  Substance Use Topics  . Alcohol use: Yes    Comment: 5 bottles of wine/week/ Quit drinking about 5 months     ALLERGIES: No Known Allergies   PERTINENT MEDICATIONS:  Outpatient Encounter Medications as of 12/15/2019  Medication Sig  . acetaminophen (TYLENOL) 325 MG tablet Take 650 mg by mouth every 6 (six) hours as needed.  Marland Kitchen Apixaban Starter Pack, 10mg  and 5mg , (ELIQUIS DVT/PE STARTER PACK) Take as directed on package: start with two-5mg  tablets twice daily for 7 days. On day 8, switch to one-5mg  tablet twice daily.  Marland Kitchen dexamethasone (DECADRON) 4 MG tablet Take 1 tablet (4 mg total) by mouth 2 (two) times daily.  . fentaNYL (DURAGESIC) 12 MCG/HR Place 1 patch onto the skin every 3 (three) days. (Patient not taking: Reported on 12/16/2019)  . furosemide (LASIX) 20 MG tablet Take 1 tablet (20 mg total) by mouth daily as needed. For leg swelling. (Patient not taking: Reported on 12/14/2019)  . lactulose (CHRONULAC) 10 GM/15ML solution Take 15-30 mLs (10-20 g total) by mouth 2 (two) times daily as needed for mild constipation. (Patient not taking: Reported on 12/14/2019)  . magnesium chloride (SLOW-MAG) 64 MG TBEC SR tablet Take 1 tablet (64 mg total) by mouth daily. (Patient not taking: Reported on 12/12/2019)  . metoprolol tartrate (LOPRESSOR) 25 MG tablet Take 25 mg by mouth daily.  . Morphine Sulfate (MORPHINE CONCENTRATE) 10 mg / 0.5 ml concentrated solution Take 0.25-0.5 mLs (5-10 mg total) by mouth every 3 (three) hours as needed for severe pain or shortness of breath.  . ondansetron (ZOFRAN ODT) 8 MG disintegrating tablet Take 1 tablet (8 mg total) by mouth every 6 (six) hours as needed for nausea or vomiting.  Marland Kitchen oxyCODONE (OXY  IR/ROXICODONE) 5 MG immediate release tablet Take 1 tablet (5 mg total) by mouth every 6 (six) hours as needed for severe pain.  . pantoprazole (PROTONIX) 40 MG tablet Take 1 tablet (40 mg total) by mouth 2 (two) times daily.  . polyethylene glycol (MIRALAX) 17 g packet Take 17 g by mouth daily as needed. (Patient not taking: Reported on 12/14/2019)  . prochlorperazine (COMPAZINE) 10 MG tablet Take 1 tablet (10 mg total) by mouth every 6 (six) hours as needed for nausea or vomiting.  . senna (SENOKOT) 8.6 MG TABS tablet Take 2 tablets (17.2 mg total) by mouth in the morning and at bedtime. (Patient not taking: Reported on 12/14/2019)  . Sennosides (SENNA) 8.8 MG/5ML SYRP Take 5-10 mLs (8.8-17.6 mg total) by mouth 2 (two) times daily. (Patient not taking: Reported on 12/14/2019)  . traZODone (  DESYREL) 50 MG tablet Take 50 mg by mouth at bedtime.   Facility-Administered Encounter Medications as of 12/15/2019  Medication  . sodium chloride flush (NS) 0.9 % injection 3 mL  . [DISCONTINUED] 0.9 %  sodium chloride infusion     Hulan Szumski Jenetta Downer, NP

## 2019-12-16 NOTE — Procedures (Signed)
Interventional Radiology Procedure Note  Procedure: [place] and fluoro guided abdominal pleurX insertion    Complications: None  Estimated Blood Loss:  min  Findings: 1800cc removed    Tamera Punt, MD

## 2019-12-16 NOTE — H&P (Signed)
Chief Complaint:  Malignant ascites   Referring Physician(s): Burns,Jennifer E   History of Present Illness: Vernon Patterson is a 64 y.o. 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  He has been requiriing large vol paras.  Stable today. Npo. Here for abdominal pleurx insertion to be used by hospice for comfort.  Past Medical History:  Diagnosis Date  . Anxiety   . Coronary artery disease   . Depression   . Heart murmur   . Hypertension   . Nausea and vomiting 12/01/2019    Past Surgical History:  Procedure Laterality Date  . CARDIAC CATHETERIZATION    . CORONARY ARTERY BYPASS GRAFT    . CORONARY ARTERY BYPASS GRAFT N/A 04/18/2013   Procedure: CORONARY ARTERY BYPASS GRAFTING (CABG);  Surgeon: Gaye Pollack, MD;  Location: [place];  Service: Open Heart Surgery;  Laterality: N/A;  Times  3  using left internal mammary artery and endoscopically harvested right saphenous vein  . ENDOVEIN HARVEST OF GREATER SAPHENOUS VEIN Right 04/18/2013   Procedure: ENDOVEIN HARVEST OF GREATER SAPHENOUS VEIN;  Surgeon: Gaye Pollack, MD;  Location: [place];  Service: Open Heart Surgery;  Laterality: Right;  Some of the saphenous vein from the lower leg also used.  . ESOPHAGOGASTRODUODENOSCOPY (EGD) WITH PROPOFOL N/A 11/26/2019   Procedure: ESOPHAGOGASTRODUODENOSCOPY (EGD) WITH PROPOFOL;  Surgeon: Lin Landsman, MD;  Location: [place];  Service: Gastroenterology;  Laterality: N/A;  . INTRAOPERATIVE TRANSESOPHAGEAL ECHOCARDIOGRAM N/A 04/18/2013   Procedure: INTRAOPERATIVE TRANSESOPHAGEAL ECHOCARDIOGRAM;  Surgeon: Gaye Pollack, MD;  Location: [place] [facility];  Service: Open Heart Surgery;  Laterality: N/A;    Allergies: Patient has no known allergies.  Medications: Prior to Admission medications   Medication Sig Start Date End Date Taking? Authorizing Provider  acetaminophen (TYLENOL) 325 MG tablet Take 650 mg by mouth every 6 (six) hours as needed.     [provider]  Apixaban Starter Pack, 10mg  and 5mg , (ELIQUIS DVT/PE STARTER PACK) Take as directed on package: start with two-5mg  tablets twice daily for 7 days. On day 8, switch to one-5mg  tablet twice daily. 12/12/19   Earlie Server, MD  dexamethasone (DECADRON) 4 MG tablet Take 1 tablet (4 mg total) by mouth 2 (two) times daily. 12/07/19   Jacquelin Hawking, NP  fentaNYL (DURAGESIC) 12 MCG/HR Place 1 patch onto the skin every 3 (three) days. 12/05/19   Earlie Server, MD  furosemide (LASIX) 20 MG tablet Take 1 tablet (20 mg total) by mouth daily as needed. For leg swelling. Patient not taking: Reported on 12/14/2019 12/12/19   Earlie Server, MD  lactulose (CHRONULAC) 10 GM/15ML solution Take 15-30 mLs (10-20 g total) by mouth 2 (two) times daily as needed for mild constipation. Patient not taking: Reported on 12/14/2019 12/12/19   Borders, Kirt Boys, NP  magnesium chloride (SLOW-MAG) 64 MG TBEC SR tablet Take 1 tablet (64 mg total) by mouth daily. Patient not taking: Reported on 12/12/2019 12/01/19   Earlie Server, MD  metoprolol tartrate (LOPRESSOR) 25 MG tablet Take 25 mg by mouth daily.    [provider]  Morphine Sulfate (MORPHINE CONCENTRATE) 10 mg / 0.5 ml concentrated solution Take 0.25-0.5 mLs (5-10 mg total) by mouth every 3 (three) hours as needed for severe pain or shortness of breath. 12/14/19   Borders, Kirt Boys, NP  ondansetron (ZOFRAN ODT) 8 MG disintegrating tablet Take 1 tablet (8 mg total) by mouth every 6 (six)  hours as needed for nausea or vomiting. 12/01/19   Earlie Server, MD  oxyCODONE (OXY IR/ROXICODONE) 5 MG immediate release tablet Take 1 tablet (5 mg total) by mouth every 6 (six) hours as needed for severe pain. 12/05/19   Earlie Server, MD  pantoprazole (PROTONIX) 40 MG tablet Take 1 tablet (40 mg total) by mouth 2 (two) times daily. 11/28/19 12/28/19  Wyvonnia Dusky, MD  polyethylene glycol (MIRALAX) 17 g packet Take 17 g by mouth daily as needed. Patient not taking: Reported on 12/14/2019  12/05/19   Earlie Server, MD  prochlorperazine (COMPAZINE) 10 MG tablet Take 1 tablet (10 mg total) by mouth every 6 (six) hours as needed for nausea or vomiting. 12/01/19   Earlie Server, MD  senna (SENOKOT) 8.6 MG TABS tablet Take 2 tablets (17.2 mg total) by mouth in the morning and at bedtime. Patient not taking: Reported on 12/14/2019 12/05/19   Earlie Server, MD  Sennosides (SENNA) 8.8 MG/5ML SYRP Take 5-10 mLs (8.8-17.6 mg total) by mouth 2 (two) times daily. Patient not taking: Reported on 12/14/2019 12/07/19   Jacquelin Hawking, NP  traZODone (DESYREL) 50 MG tablet Take 50 mg by mouth at bedtime. 11/15/19   [provider]     Family History  Problem Relation Age of Onset  . Diabetes Mother     Social History   Socioeconomic History  . Marital status: Married    Spouse name: Not on file  . Number of children: Not on file  . Years of education: Not on file  . Highest education level: Not on file  Occupational History  . Occupation: not employed     Fish farm manager: [employer]  Tobacco Use  . Smoking status: Former Smoker    Types: Cigars    Quit date: 11/24/2019    Years since quitting: 0.0  . Smokeless tobacco: Never Used  . Tobacco comment: 1-2 cigars/day  Vaping Use  . Vaping Use: Never used  Substance and Sexual Activity  . Alcohol use: Yes    Comment: 5 bottles of wine/week/ Quit drinking about 5 months   . Drug use: No  . Sexual activity: Never  Other Topics Concern  . Not on file  Social History Narrative  . Not on file   Social Determinants of Health   Financial Resource Strain:   . Difficulty of Paying Living Expenses:   Food Insecurity:   . Worried About Charity fundraiser in the Last Year:   . Arboriculturist in the Last Year:   Transportation Needs:   . Film/video editor (Medical):   Marland Kitchen Lack of Transportation (Non-Medical):   Physical Activity:   . Days of Exercise per Week:   . Minutes of Exercise per Session:   Stress:   . Feeling of Stress :   Social  Connections:   . Frequency of Communication with Friends and Family:   . Frequency of Social Gatherings with Friends and Family:   . Attends Religious Services:   . Active Member of Clubs or Organizations:   . Attends Archivist Meetings:   Marland Kitchen Marital Status:       Review of Systems: A 12 point ROS discussed and pertinent positives are indicated in the HPI above.  All other systems are negative.  Review of Systems  Vital Signs: There were no vitals taken for this visit.  Physical Exam Constitutional:      General: He is not in acute distress.  Appearance: He is not toxic-appearing.  Eyes:     General: No scleral icterus.    Conjunctiva/sclera: Conjunctivae normal.  Cardiovascular:     Rate and Rhythm: Normal rate and regular rhythm.     Heart sounds: Murmur heard.   Pulmonary:     Effort: Pulmonary effort is normal.     Breath sounds: Normal breath sounds.  Abdominal:     General: There is distension.     Tenderness: There is no abdominal tenderness.  Skin:    Coloration: Skin is not jaundiced.  Neurological:     Mental Status: Mental status is at baseline.  Psychiatric:        Mood and Affect: Mood normal.     Imaging: DG Chest 2 View  Result Date: 11/26/2019 CLINICAL DATA:  Cough and weakness EXAM: CHEST - 2 VIEW COMPARISON:  Chest radiograph 10/19/2019 FINDINGS: Stable cardiomediastinal contours status post median sternotomy. Minimal atelectasis at the right lung base. No focal consolidation. On lateral view there is a 9 mm nodule inferiorly which is not well seen on the frontal projection. No pneumothorax or pleural effusion. No acute finding in the visualized skeleton. IMPRESSION: 1. No evidence of active disease in the chest. 2. 9 mm nodule inferiorly on the lateral view which is not well seen on the frontal projection. Consider nonemergent chest CT for further evaluation. Electronically Signed   By: Audie Pinto M.D.   On: 11/26/2019 17:12   US  Abdomen Complete  Result Date: 12/05/2019 CLINICAL DATA:  Distended abdomen EXAM: ABDOMEN ULTRASOUND COMPLETE COMPARISON:  Ultrasound abdomen 10/19/2019 FINDINGS: Gallbladder: No gallstones or wall thickening visualized. No sonographic Murphy sign noted by sonographer. Common bile duct: Diameter: 3.8 mm Liver: Diffusely increased echogenicity liver parenchyma seen previously. No focal lesion. Portal vein is patent on color Doppler imaging with normal direction of blood flow towards the liver. IVC: No abnormality visualized. Pancreas: Visualized portion unremarkable. Spleen: Size and appearance within normal limits. Right Kidney: Length: 10.6 cm. Echogenicity within normal limits. No mass or hydronephrosis visualized. Left Kidney: Length: 10.1 cm. Echogenicity within normal limits. No mass or hydronephrosis visualized. Abdominal aorta: No aneurysm visualized. Other findings: Moderate ascites.  Right pleural effusion. IMPRESSION: Echogenic liver suggesting fatty infiltration as noted previously Negative for gallstones Moderate ascites.  Right pleural effusion. Electronically Signed   By: Franchot Gallo M.D.   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 EXAM: NUCLEAR MEDICINE PET SKULL BASE TO THIGH TECHNIQUE: 8.21 mCi F-18 FDG was injected intravenously. Full-ring PET imaging was performed from the skull base to thigh after the radiotracer. CT data was obtained and used for attenuation correction and anatomic localization. Fasting blood glucose: 78 mg/dl COMPARISON:  None. FINDINGS: Mediastinal blood pool activity: SUV max 2.10 Liver activity: SUV max NA NECK: No hypermetabolic lymph nodes in the neck. Incidental CT findings: Extensive age advanced carotid artery calcifications. CHEST: 12 mm left paraesophageal lymph node on image 149/7 is hypermetabolic with SUV max of 0.26 and consistent with  nodal metastasis. Moderate left and small right pleural effusions are noted. No hypermetabolic pleural nodules to suggest pleural metastatic disease. No worrisome pulmonary nodules on the lung windows to suggest pulmonary metastatic disease. No other hypermetabolic mediastinal or hilar lymph nodes. Incidental CT findings: Age advanced vascular calcifications and surgical changes from prior gastric bypass surgery. Extensive three-vessel coronary artery calcifications. ABDOMEN/PELVIS: There is a  large irregular mass involving the gastric cardia just below the GE junction measuring approximately 5.0 x 4.8 cm. There appears to be direct extension into the surrounding soft tissues with a large nodal mass in the gastrohepatic ligament. Right-sided hepatic duodenum ligament adenopathy the measuring 2 cm with SUV max of 9.01. Mesenteric nodal mass adjacent to the SMA measures 3.5 cm on image 156/3. SUV max is 11.56. Right-sided retroperitoneal nodal mass measures 2.5 cm on image 165/3. SUV max is 11.86. No findings for hepatic metastatic disease. Moderate abdominal/pelvic ascites but no obvious omental or peritoneal surface lesions. Incidental CT findings: Significant age advanced vascular disease. SKELETON: No focal hypermetabolic activity to suggest skeletal metastasis. Incidental CT findings: none IMPRESSION: 1. 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 It appears to be directly infiltrating the surrounding soft tissues and there is bulky gastrohepatic ligament, paraduodenal, retroperitoneal and mesenteric adenopathy. There is also upward lymphatic disease with an enlarged and hypermetabolic mid level esophageal lymph node. 2. No findings for pulmonary metastatic disease or hepatic metastatic disease. 3. Bilateral pleural effusions and moderate volume abdominal/pelvic ascites. Electronically Signed   By: Marijo Sanes M.D.   On: 12/07/2019 15:49   US Venous Img Lower Bilateral  Result  Date: 12/12/2019 CLINICAL DATA:  64 year old male with a history of DVT in leg swelling EXAM: BILATERAL LOWER EXTREMITY VENOUS DOPPLER ULTRASOUND TECHNIQUE: Gray-scale sonography with graded compression, as well as color Doppler and duplex ultrasound were performed to evaluate the lower extremity deep venous systems from the level of the common femoral vein and including the common femoral, femoral, profunda femoral, popliteal and calf veins including the posterior tibial, peroneal and gastrocnemius veins when visible. The superficial great saphenous vein was also interrogated. Spectral Doppler was utilized to evaluate flow at rest and with distal augmentation maneuvers in the common femoral, femoral and popliteal veins. COMPARISON:  No comparison available FINDINGS: RIGHT LOWER EXTREMITY Common Femoral Vein: No evidence of thrombus. Normal compressibility, respiratory phasicity and response to augmentation. Saphenofemoral Junction: No evidence of thrombus. Normal compressibility and flow on color Doppler imaging. Profunda Femoral Vein: No evidence of thrombus. Normal compressibility and flow on color Doppler imaging. Femoral Vein: No evidence of thrombus. Normal compressibility, respiratory phasicity and response to augmentation. Popliteal Vein: No evidence of thrombus. Normal compressibility, respiratory phasicity and response to augmentation. Calf Veins: No evidence of thrombus. Normal compressibility and flow on color Doppler imaging. Superficial Great Saphenous Vein: No evidence of thrombus. Normal compressibility and flow on color Doppler imaging. Other Findings:  None. LEFT LOWER EXTREMITY Common Femoral Vein: No evidence of thrombus. Normal compressibility, respiratory phasicity and response to augmentation. Saphenofemoral Junction: No evidence of thrombus. Normal compressibility and flow on color Doppler imaging. Profunda Femoral Vein: No evidence of thrombus. Normal compressibility and flow on color Doppler  imaging. Femoral Vein: No evidence of thrombus. Normal compressibility, respiratory phasicity and response to augmentation. Popliteal Vein: Nonocclusive thrombus with incomplete compressibility of the popliteal vein. Calf Veins: Posterior tibial vein and peroneal vein patent. Occlusive thrombus of the gastrocnemius vein Superficial Great Saphenous Vein: No evidence of thrombus. Normal compressibility and flow on color Doppler imaging. Other Findings:  None. IMPRESSION: Sonographic survey of the left lower extremity positive for DVT involving the gastrocnemius vein, with extension into the popliteal vein. Popliteal thrombus is not nonocclusive on the current duplex. Sonographic survey of the right lower extremity negative for DVT Electronically Signed   By: Corrie Mckusick D.O.   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 EXAM: ULTRASOUND GUIDED PARACENTESIS MEDICATIONS: None. COMPLICATIONS: None immediate. PROCEDURE: Informed written consent was obtained from the patient after a discussion of the risks, benefits and alternatives to treatment. A timeout was performed prior to the initiation of the procedure. Initial ultrasound was performed to localize ascites. The right lower abdomen was prepped and draped in the usual sterile fashion. 1% lidocaine was used for local anesthesia. Following this, a 6 Fr Safe-T-Centesis catheter was introduced. An ultrasound image was saved for documentation purposes. The paracentesis was performed. The catheter was removed and a dressing was applied. The patient tolerated the procedure well without immediate post procedural complication. FINDINGS: A total of approximately 2.2 L of cloudy, yellow fluid was removed. Samples were sent to the laboratory as requested by the clinical team. IMPRESSION: Successful ultrasound-guided paracentesis yielding 2.2 liters of peritoneal fluid. Electronically Signed   By:  Aletta Edouard M.D.   On: 12/09/2019 16:34   US Paracentesis  Result Date: 12/06/2019 INDICATION: Patient with gastric cardia mass likely malignancy, new onset abdominal distension. Request to IR for diagnostic and therapeutic paracentesis. EXAM: ULTRASOUND GUIDED DIAGNOSTIC AND THERAPEUTIC PARACENTESIS MEDICATIONS: 10 mL 1% lidocaine COMPLICATIONS: None immediate. PROCEDURE: Informed written consent was obtained from the patient after a discussion of the risks, benefits and alternatives to treatment. A timeout was performed prior to the initiation of the procedure. Initial ultrasound scanning demonstrates a moderate amount of ascites within the right lower abdominal quadrant. The right lower abdomen was prepped and draped in the usual sterile fashion. 1% lidocaine was used for local anesthesia. Following this, a 6 Fr Safe-T-Centesis catheter was introduced. An ultrasound image was saved for documentation purposes. The paracentesis was performed. The catheter was removed and a dressing was applied. The patient tolerated the procedure well without immediate post procedural complication. FINDINGS: A total of approximately 3.8 L of hazy yellow fluid was removed. Samples were sent to the laboratory as requested by the clinical team. IMPRESSION: Successful ultrasound-guided paracentesis yielding 3.8 liters of peritoneal fluid. Read by Candiss Norse, PA-C Electronically Signed   By: Sandi Mariscal M.D.   On: 12/06/2019 12:31   ECHOCARDIOGRAM COMPLETE  Result Date: 11/25/2019    ECHOCARDIOGRAM REPORT   Patient Name:   ADRIANE GUGLIELMO Date of Exam: 11/25/2019 Medical Rec #:  [id number]      Height:       68.0 in Accession #:    [id number]     Weight:       129.9 lb Date of Birth:  August 15, 1955      BSA:          1.701 m Patient Age:    23 years       BP:           143/81 mmHg Patient Gender: M              HR:           58 bpm. Exam Location:  [facility] Procedure: 2D Echo, Color Doppler and Cardiac Doppler Indications:      R01.2 Abnormal Heart Sounds Nec  History:         Patient has no prior history of Echocardiogram examinations.                  CAD; Prior CABG.  Sonographer:     Charmayne Sheer RDCS (AE) Referring Phys:  [id number] Arvil Chaco Diagnosing Phys: Ida Rogue MD IMPRESSIONS  1.  Left ventricular ejection fraction, by estimation, is 60 to 65%. The left ventricle has normal function. The left ventricle has no regional wall motion abnormalities. Left ventricular diastolic parameters are consistent with Grade II diastolic dysfunction (pseudonormalization).  2. Right ventricular systolic function is normal. The right ventricular size is normal. There is mildly elevated pulmonary artery systolic pressure.  3. Left atrial size was mildly dilated.  4. The aortic valve is heavily calcified. Aortic valve regurgitation is mild. Severe aortic valve stenosis. Aortic valve area, by VTI measures 0.86 cm. Aortic valve mean gradient measures 38.0 mmHg. Aortic valve Vmax measures 4.02 m/s. FINDINGS  Left Ventricle: Left ventricular ejection fraction, by estimation, is 60 to 65%. The left ventricle has normal function. The left ventricle has no regional wall motion abnormalities. The left ventricular internal cavity size was normal in size. There is  no left ventricular hypertrophy. Left ventricular diastolic parameters are consistent with Grade II diastolic dysfunction (pseudonormalization). Right Ventricle: The right ventricular size is normal. No increase in right ventricular wall thickness. Right ventricular systolic function is normal. There is mildly elevated pulmonary artery systolic pressure. The tricuspid regurgitant velocity is 2.53  m/s, and with an assumed right atrial pressure of 10 mmHg, the estimated right ventricular systolic pressure is 24.2 mmHg. Left Atrium: Left atrial size was mildly dilated. Right Atrium: Right atrial size was normal in size. Pericardium: There is no evidence of pericardial effusion. Mitral  Valve: The mitral valve is normal in structure. There is mild thickening of the mitral valve leaflet(s). Normal mobility of the mitral valve leaflets. Mild mitral valve regurgitation. No evidence of mitral valve stenosis. MV peak gradient, 4.2 mmHg. The mean mitral valve gradient is 2.0 mmHg. Tricuspid Valve: The tricuspid valve is normal in structure. Tricuspid valve regurgitation is not demonstrated. No evidence of tricuspid stenosis. Aortic Valve: The aortic valve is normal in structure. Aortic valve regurgitation is mild. Severe aortic stenosis is present. Aortic valve mean gradient measures 38.0 mmHg. Aortic valve peak gradient measures 64.6 mmHg. Aortic valve area, by VTI measures  0.86 cm. Pulmonic Valve: The pulmonic valve was normal in structure. Pulmonic valve regurgitation is not visualized. No evidence of pulmonic stenosis. Aorta: The aortic root is normal in size and structure. Venous: The inferior vena cava is normal in size with greater than 50% respiratory variability, suggesting right atrial pressure of 3 mmHg. IAS/Shunts: No atrial level shunt detected by color flow Doppler.  LEFT VENTRICLE PLAX 2D LVIDd:         3.94 cm  Diastology LVIDs:         2.13 cm  LV e' lateral:   9.90 cm/s LV PW:         1.26 cm  LV E/e' lateral: 10.4 LV IVS:        1.13 cm  LV e' medial:    5.22 cm/s LVOT diam:     2.40 cm  LV E/e' medial:  19.7 LV SV:         89 LV SV Index:   52 LVOT Area:     4.52 cm  RIGHT VENTRICLE RV Basal diam:  3.15 cm LEFT ATRIUM             Index       RIGHT ATRIUM           Index LA diam:        4.20 cm 2.47 cm/m  RA Area:     12.70 cm LA Vol (A2C):  62.5 ml 36.74 ml/m RA Volume:   27.80 ml  16.34 ml/m LA Vol (A4C):   48.2 ml 28.33 ml/m LA Biplane Vol: 55.2 ml 32.45 ml/m  AORTIC VALVE                    PULMONIC VALVE AV Area (Vmax):    0.82 cm     PV Vmax:       2.01 m/s AV Area (Vmean):   0.95 cm     PV Vmean:      135.000 cm/s AV Area (VTI):     0.86 cm     PV VTI:        0.470  m AV Vmax:           402.00 cm/s  PV Peak grad:  16.2 mmHg AV Vmean:          247.500 cm/s PV Mean grad:  9.0 mmHg AV VTI:            1.040 m AV Peak Grad:      64.6 mmHg AV Mean Grad:      38.0 mmHg LVOT Vmax:         72.50 cm/s LVOT Vmean:        51.900 cm/s LVOT VTI:          0.197 m LVOT/AV VTI ratio: 0.19  AORTA Ao Root diam: 3.80 cm MITRAL VALVE                TRICUSPID VALVE MV Area (PHT): 4.04 cm     TR Peak grad:   25.6 mmHg MV Peak grad:  4.2 mmHg     TR Vmax:        253.00 cm/s MV Mean grad:  2.0 mmHg MV Vmax:       1.02 m/s     SHUNTS MV Vmean:      58.1 cm/s    Systemic VTI:  0.20 m MV Decel Time: 188 msec     Systemic Diam: 2.40 cm MV E velocity: 103.00 cm/s MV A velocity: 45.00 cm/s MV E/A ratio:  2.29 Ida Rogue MD Electronically signed by Ida Rogue MD Signature Date/Time: 11/25/2019/2:19:12 PM    Final     Labs:  CBC: Recent Labs    12/07/19 0906 12/09/19 0959 12/12/19 0911 12/14/19 1107  WBC 7.6 7.3 10.9* 10.7*  HGB 10.4* 10.9* 11.1* 10.4*  HCT 30.9* 32.4* 33.0* 30.7*  PLT 234 230 209 185    COAGS: No results for input(s): INR, APTT in the last 8760 hours.  BMP: Recent Labs    12/07/19 0906 12/09/19 0959 12/12/19 0911 12/14/19 1107  NA 134* 135 134* 133*  K 3.4* 4.2 4.1 4.1  CL 105 103 100 101  CO2 24 25 24 25   GLUCOSE 88 86 117* 151*  BUN 14 12 14 13   CALCIUM 8.1* 8.3* 8.4* 7.8*  CREATININE 0.65 0.78 0.70 0.68  GFRNONAA >60 >60 >60 >60  GFRAA >60 >60 >60 >60    LIVER FUNCTION TESTS: Recent Labs    12/07/19 0906 12/09/19 0959 12/12/19 0911 12/14/19 1107  BILITOT 0.7 1.2 0.6 1.0  AST 30 50* 28 23  ALT 18 24 29 24   ALKPHOS 57 62 67 62  PROT 5.6* 5.9* 5.9* 5.3*  ALBUMIN 2.6* 2.7* 2.8* 2.4*    TUMOR MARKERS: No results for input(s): AFPTM, CEA, CA199, CHROMGRNA in the last 8760 hours.  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  Plan for abd pleurx today.  Risks and benefits discussed with the patient  including bleeding, infection, damage to adjacent structures, bowel perforation/fistula connection, and sepsis.  All of the patient's questions were answered, patient is agreeable to proceed. Consent signed and in chart.    Thank you for this interesting consult.  I greatly enjoyed meeting Vernon Patterson and look forward to participating in their care.  A copy of this report was sent to the requesting provider on this date.  Electronically Signed: Greggory Keen, MD 12/16/2019, 9:38 AM   I spent a total of  40 Minutes   in face to face in clinical consultation, greater than 50% of which was counseling/coordinating care for for this pt with malignant ascites.

## 2019-12-19 ENCOUNTER — Inpatient Hospital Stay: Payer: Medicare Other | Admitting: Hospice and Palliative Medicine

## 2019-12-19 ENCOUNTER — Inpatient Hospital Stay: Payer: Medicare Other

## 2019-12-19 ENCOUNTER — Inpatient Hospital Stay: Payer: Medicare Other | Admitting: Oncology

## 2019-12-19 ENCOUNTER — Telehealth: Payer: Self-pay | Admitting: *Deleted

## 2019-12-19 MED ORDER — MORPHINE SULFATE (CONCENTRATE) 10 MG /0.5 ML PO SOLN: 5.0000 mg | ORAL | 0 refills | Status: AC | PRN

## 2019-12-19 NOTE — Telephone Encounter (Signed)
Vernon Patterson with Hospice called reporting that patient is having increased pain not controlled by Dexamethasone and Oxycodone today and side effects is requesting rcx for Roxanol to be sent to [facility] in [place]. Please advise

## 2019-12-19 NOTE — Telephone Encounter (Signed)
Rx sent 

## 2019-12-20 ENCOUNTER — Ambulatory Visit: Payer: Medicare Other

## 2019-12-21 ENCOUNTER — Encounter: Payer: Self-pay | Admitting: Oncology

## 2019-12-21 ENCOUNTER — Ambulatory Visit: Payer: Medicare Other

## 2019-12-21 NOTE — Telephone Encounter (Signed)
Results for omniseq scanned in Media.

## 2019-12-22 ENCOUNTER — Ambulatory Visit: Payer: Medicare Other

## 2019-12-23 ENCOUNTER — Ambulatory Visit: Payer: Medicare Other

## 2019-12-26 ENCOUNTER — Ambulatory Visit: Payer: Medicare Other

## 2019-12-27 ENCOUNTER — Ambulatory Visit: Payer: Medicare Other

## 2019-12-28 ENCOUNTER — Ambulatory Visit: Payer: Medicare Other

## 2019-12-29 ENCOUNTER — Ambulatory Visit: Payer: Medicare Other

## 2019-12-30 ENCOUNTER — Ambulatory Visit: Payer: Medicare Other | Admitting: Cardiovascular Disease

## 2019-12-30 ENCOUNTER — Ambulatory Visit: Payer: Medicare Other

## 2020-01-02 ENCOUNTER — Ambulatory Visit: Payer: Medicare Other

## 2020-01-03 ENCOUNTER — Ambulatory Visit: Payer: Medicare Other

## 2020-01-04 ENCOUNTER — Ambulatory Visit: Payer: Medicare Other

## 2020-01-05 ENCOUNTER — Ambulatory Visit: Payer: Medicare Other

## 2020-01-06 ENCOUNTER — Ambulatory Visit: Payer: Medicare Other

## 2020-01-09 ENCOUNTER — Ambulatory Visit: Payer: Medicare Other | Admitting: Gastroenterology

## 2020-01-09 ENCOUNTER — Ambulatory Visit: Payer: Medicare Other

## 2020-01-10 ENCOUNTER — Ambulatory Visit: Payer: Medicare Other

## 2020-01-11 ENCOUNTER — Ambulatory Visit: Payer: Medicare Other

## 2020-01-11 DEATH — deceased

## 2020-01-12 ENCOUNTER — Ambulatory Visit: Payer: Medicare Other

## 2020-01-13 ENCOUNTER — Ambulatory Visit: Payer: Medicare Other

## 2020-01-17 ENCOUNTER — Ambulatory Visit: Payer: Medicare Other

## 2020-01-18 ENCOUNTER — Ambulatory Visit: Payer: Medicare Other

## 2020-01-19 ENCOUNTER — Ambulatory Visit: Payer: Medicare Other

## 2020-01-20 ENCOUNTER — Ambulatory Visit: Payer: Medicare Other

## 2020-01-23 ENCOUNTER — Ambulatory Visit: Payer: Medicare Other

## 2020-01-24 ENCOUNTER — Ambulatory Visit: Payer: Medicare Other

## 2020-01-25 ENCOUNTER — Ambulatory Visit: Payer: Medicare Other

## 2020-01-26 ENCOUNTER — Ambulatory Visit: Payer: Medicare Other

## 2020-01-27 ENCOUNTER — Ambulatory Visit: Payer: Medicare Other

## 2020-01-27 ENCOUNTER — Telehealth: Payer: Self-pay

## 2020-01-27 NOTE — Telephone Encounter (Signed)
lmom to schedule

## 2020-01-27 NOTE — Telephone Encounter (Signed)
-----   Message from Marykay Lex sent at 01/17/2020  9:35 AM EDT ----- LVM for patient to [place] back ----- Message ----- From: Annia Belt, RN Sent: 12/07/2019   1:56 PM EDT To: Valora Corporal, RN, [facility] Scheduling  Looks like patient will need appointment with Dr Rockey Situ in several weeks. Patient has a lot going on right now.    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 He is going to have an outpatient PET and await biopsy results from yesterday. Plan is for DC tomorrow. I recommended that cath be cancelled and that he have OP cards FU in 3-4 weeks. Should give time to figure out plan for cancer treatment. If he has extensive stage CA wouldn't do anything with his AS. If he needs big GI surgery, would consider TAVR first. Suspect news won't be good for him.   Thx - Coop

## 2020-01-30 ENCOUNTER — Ambulatory Visit: Payer: Medicare Other

## 2020-01-31 ENCOUNTER — Ambulatory Visit: Payer: Medicare Other

## 2020-02-01 ENCOUNTER — Ambulatory Visit: Payer: Medicare Other

## 2021-05-28 IMAGING — CT NM PET TUM IMG INITIAL (PI) SKULL BASE T - THIGH
1 of 9 series · 1 of 25 positions shown · non-contrast
Comparison: None.

CLINICAL DATA: Initial treatment strategy for GE junction
carcinoma.

EXAM:
NUCLEAR MEDICINE PET SKULL BASE TO THIGH
TECHNIQUE: 8.21 mCi F-18 FDG was injected intravenously. Full-ring PET imaging
was performed from the skull base to thigh after the radiotracer. CT
data was obtained and used for attenuation correction and anatomic
localization.
Fasting blood glucose: 78 mg/dl

[Series 3: ct wb 5.0 b30f · axial · 5.0mm · 0.98mm/px · 1 of 329 slices shown]
[im 329/329  brain]
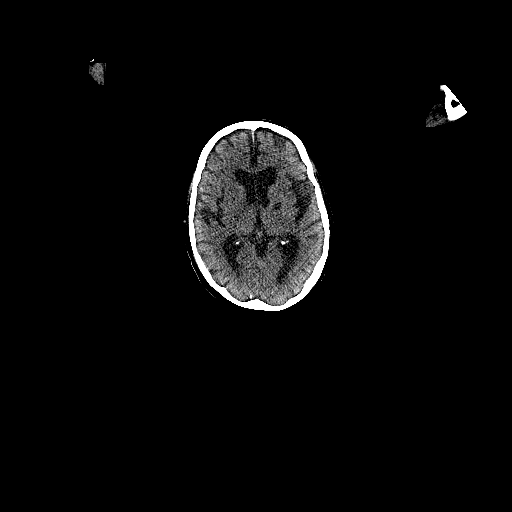

[1 of 25 positions shown; findings below may reference images not displayed]

FINDINGS: Mediastinal blood pool activity: SUV max

Liver activity: SUV max NA

NECK: No hypermetabolic lymph nodes in the neck.

Incidental CT findings: Extensive age advanced carotid artery
calcifications.

CHEST: 12 mm left paraesophageal lymph node on image 107/3 is
hypermetabolic with SUV max of 7.95 and consistent with nodal
metastasis.

Moderate left and small right pleural effusions are noted. No
hypermetabolic pleural nodules to suggest pleural metastatic
disease. No worrisome pulmonary nodules on the lung windows to
suggest pulmonary metastatic disease. No other hypermetabolic
mediastinal or hilar lymph nodes.

Incidental CT findings: Age advanced vascular calcifications and
surgical changes from prior gastric bypass surgery. Extensive
three-vessel coronary artery calcifications.

ABDOMEN/PELVIS: There is a large irregular mass involving the
gastric cardia just below the GE junction measuring approximately
5.0 x 4.8 cm. There appears to be direct extension into the
surrounding soft tissues with a large nodal mass in the
gastrohepatic ligament. Right-sided hepatic duodenum ligament
adenopathy the measuring 2 cm with SUV max of 9.01.

Mesenteric nodal mass adjacent to the SMA measures 3.5 cm on image
156/3. SUV max is 11.56.

Right-sided retroperitoneal nodal mass measures 2.5 cm on image
165/3. SUV max is 11.86.

No findings for hepatic metastatic disease.

Moderate abdominal/pelvic ascites but no obvious omental or
peritoneal surface lesions.

Incidental CT findings: Significant age advanced vascular disease.

SKELETON: No focal hypermetabolic activity to suggest skeletal
metastasis.

Incidental CT findings: none
IMPRESSION: 1. Large hypermetabolic gastric cardia mass consistent with known
carcinoma. It appears to be directly infiltrating the surrounding
soft tissues and there is bulky gastrohepatic ligament,
paraduodenal, retroperitoneal and mesenteric adenopathy. There is
also upward lymphatic disease with an enlarged and hypermetabolic
mid level esophageal lymph node.
2. No findings for pulmonary metastatic disease or hepatic
metastatic disease.
3. Bilateral pleural effusions and moderate volume abdominal/pelvic
ascites.
